# Patient Record
Sex: Female | Born: 1950
Health system: Southern US, Community
[De-identification: ages and names within clinical notes are randomized; demographics above are authoritative.]

## PROBLEM LIST (undated history)

## (undated) DIAGNOSIS — Z800 Family history of malignant neoplasm of digestive organs: Secondary | ICD-10-CM

## (undated) DIAGNOSIS — Z8042 Family history of malignant neoplasm of prostate: Secondary | ICD-10-CM

## (undated) DIAGNOSIS — T7840XA Allergy, unspecified, initial encounter: Secondary | ICD-10-CM

## (undated) DIAGNOSIS — K219 Gastro-esophageal reflux disease without esophagitis: Secondary | ICD-10-CM

## (undated) DIAGNOSIS — Z860100 Personal history of colon polyps, unspecified: Secondary | ICD-10-CM

## (undated) DIAGNOSIS — E785 Hyperlipidemia, unspecified: Secondary | ICD-10-CM

## (undated) DIAGNOSIS — F419 Anxiety disorder, unspecified: Secondary | ICD-10-CM

## (undated) DIAGNOSIS — Z801 Family history of malignant neoplasm of trachea, bronchus and lung: Secondary | ICD-10-CM

## (undated) DIAGNOSIS — I10 Essential (primary) hypertension: Secondary | ICD-10-CM

## (undated) HISTORY — DX: Allergy, unspecified, initial encounter: T78.40XA

## (undated) HISTORY — DX: Personal history of colonic polyps: Z86.010

## (undated) HISTORY — DX: Essential (primary) hypertension: I10

## (undated) HISTORY — DX: Hyperlipidemia, unspecified: E78.5

## (undated) HISTORY — DX: Family history of malignant neoplasm of digestive organs: Z80.0

## (undated) HISTORY — DX: Gastro-esophageal reflux disease without esophagitis: K21.9

## (undated) HISTORY — DX: Family history of malignant neoplasm of prostate: Z80.42

## (undated) HISTORY — DX: Family history of malignant neoplasm of trachea, bronchus and lung: Z80.1

## (undated) HISTORY — DX: Personal history of colon polyps, unspecified: Z86.0100

---
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
## 2005-10-13 ENCOUNTER — Ambulatory Visit: Payer: Self-pay

## 2008-04-30 ENCOUNTER — Ambulatory Visit: Payer: Self-pay | Admitting: Family Medicine

## 2009-07-13 ENCOUNTER — Ambulatory Visit: Payer: Self-pay | Admitting: Family Medicine

## 2009-08-27 LAB — HM COLONOSCOPY: HM Colonoscopy: NORMAL

## 2009-09-15 ENCOUNTER — Ambulatory Visit: Payer: Self-pay | Admitting: Gastroenterology

## 2010-10-18 ENCOUNTER — Ambulatory Visit: Payer: Self-pay | Admitting: Family Medicine

## 2011-12-26 ENCOUNTER — Ambulatory Visit: Payer: Self-pay | Admitting: Family Medicine

## 2012-02-27 ENCOUNTER — Ambulatory Visit: Payer: Self-pay | Admitting: Family Medicine

## 2012-03-19 ENCOUNTER — Ambulatory Visit: Payer: Self-pay | Admitting: Family Medicine

## 2012-04-19 ENCOUNTER — Ambulatory Visit: Payer: Self-pay | Admitting: Family Medicine

## 2012-12-26 ENCOUNTER — Ambulatory Visit: Payer: Self-pay | Admitting: Family Medicine

## 2013-11-03 LAB — LIPID PANEL
Cholesterol: 194 mg/dL (ref 0–200)
HDL: 57 mg/dL (ref 35–70)
LDL CALC: 115 mg/dL
Triglycerides: 110 mg/dL (ref 40–160)

## 2014-02-24 ENCOUNTER — Ambulatory Visit: Payer: Self-pay | Admitting: Family Medicine

## 2014-03-08 LAB — HM MAMMOGRAPHY: HM Mammogram: NORMAL

## 2014-11-25 ENCOUNTER — Ambulatory Visit (INDEPENDENT_AMBULATORY_CARE_PROVIDER_SITE_OTHER): Payer: Commercial Managed Care - HMO | Admitting: Family Medicine

## 2014-11-25 ENCOUNTER — Encounter: Payer: Self-pay | Admitting: Family Medicine

## 2014-11-25 VITALS — BP 124/88 | HR 65 | Temp 98.4°F | Resp 16 | Ht 71.0 in | Wt 250.3 lb

## 2014-11-25 DIAGNOSIS — F419 Anxiety disorder, unspecified: Secondary | ICD-10-CM | POA: Diagnosis not present

## 2014-11-25 DIAGNOSIS — Z13220 Encounter for screening for lipoid disorders: Secondary | ICD-10-CM

## 2014-11-25 DIAGNOSIS — I10 Essential (primary) hypertension: Secondary | ICD-10-CM | POA: Diagnosis not present

## 2014-11-25 DIAGNOSIS — G4700 Insomnia, unspecified: Secondary | ICD-10-CM | POA: Insufficient documentation

## 2014-11-25 DIAGNOSIS — E781 Pure hyperglyceridemia: Secondary | ICD-10-CM | POA: Insufficient documentation

## 2014-11-25 DIAGNOSIS — Z1331 Encounter for screening for depression: Secondary | ICD-10-CM | POA: Insufficient documentation

## 2014-11-25 DIAGNOSIS — E65 Localized adiposity: Secondary | ICD-10-CM | POA: Insufficient documentation

## 2014-11-25 DIAGNOSIS — E8881 Metabolic syndrome: Secondary | ICD-10-CM | POA: Insufficient documentation

## 2014-11-25 DIAGNOSIS — IMO0002 Reserved for concepts with insufficient information to code with codable children: Secondary | ICD-10-CM | POA: Insufficient documentation

## 2014-11-25 DIAGNOSIS — Z6839 Body mass index (BMI) 39.0-39.9, adult: Secondary | ICD-10-CM | POA: Insufficient documentation

## 2014-11-25 DIAGNOSIS — E669 Obesity, unspecified: Secondary | ICD-10-CM | POA: Diagnosis not present

## 2014-11-25 HISTORY — DX: Anxiety disorder, unspecified: F41.9

## 2014-11-25 NOTE — Patient Instructions (Signed)
F/u 4 mo 

## 2014-11-25 NOTE — Progress Notes (Signed)
Name: Bethany Bowman   MRN: [id number]    DOB: [birth year]   Date:11/25/2014       Progress Note  Subjective  Chief Complaint  Chief Complaint  Patient presents with  . Hypertension  . Hyperlipidemia  . Anxiety    Hypertension This is a chronic problem. The current episode started more than 1 year ago. The problem is unchanged. The problem is controlled. Associated symptoms include anxiety. Pertinent negatives include no blurred vision, chest pain, headaches, neck pain, orthopnea, palpitations or shortness of breath. There are no associated agents to hypertension. Risk factors for coronary artery disease include dyslipidemia, obesity, stress and sedentary lifestyle. Past treatments include beta blockers, angiotensin blockers and diuretics. There are no compliance problems.   Hyperlipidemia This is a chronic problem. The current episode started more than 1 year ago. The problem is uncontrolled. Recent lipid tests were reviewed and are high. Exacerbating diseases include obesity. Factors aggravating her hyperlipidemia include fatty foods and thiazides. Pertinent negatives include no chest pain, focal weakness, myalgias or shortness of breath. Current antihyperlipidemic treatment includes statins. The current treatment provides mild improvement of lipids. Risk factors for coronary artery disease include diabetes mellitus, dyslipidemia, hypertension, stress and a sedentary lifestyle.  Anxiety Presents for follow-up visit. Onset was at an unknown time. Symptoms include excessive worry, insomnia, nervous/anxious behavior and restlessness. Patient reports no chest pain, dizziness, nausea, palpitations or shortness of breath. Symptoms occur most days. The severity of symptoms is moderate. The symptoms are aggravated by caffeine and work stress. The quality of sleep is fair.   The treatment provided moderate relief. Compliance with prior treatments has been good.   OBESITY Not exercising  Not following  diet   Past Medical History  Diagnosis Date  . Hypertension   . Hyperlipidemia   . GERD (gastroesophageal reflux disease)   . Allergy     History  Substance Use Topics  . Smoking status: Former Research scientist (life sciences)  . Smokeless tobacco: Not on file  . Alcohol Use: No     Current outpatient prescriptions:  .  atenolol (TENORMIN) 50 MG tablet, Take 50 mg by mouth daily., Disp: , Rfl:  .  LORazepam (ATIVAN) 0.5 MG tablet, Take 0.5 mg by mouth at bedtime., Disp: , Rfl:  .  potassium chloride SA (K-DUR,KLOR-CON) 20 MEQ tablet, Take 20 mEq by mouth 2 (two) times daily., Disp: , Rfl:  .  pravastatin (PRAVACHOL) 20 MG tablet, Take 20 mg by mouth daily., Disp: , Rfl:  .  telmisartan-hydrochlorothiazide (MICARDIS HCT) 80-25 MG per tablet, Take 1 tablet by mouth daily., Disp: , Rfl:   No Known Allergies  Review of Systems  Constitutional: Negative for fever, chills and weight loss.  HENT: Negative for congestion, hearing loss, sore throat and tinnitus.   Eyes: Negative for blurred vision, double vision and redness.  Respiratory: Negative for cough, hemoptysis and shortness of breath.   Cardiovascular: Negative for chest pain, palpitations, orthopnea, claudication and leg swelling.  Gastrointestinal: Negative for heartburn, nausea, vomiting, diarrhea, constipation and blood in stool.  Genitourinary: Negative for dysuria, urgency, frequency and hematuria.  Musculoskeletal: Negative for myalgias, back pain, joint pain, falls and neck pain.  Skin: Negative for itching.  Neurological: Negative for dizziness, tingling, tremors, focal weakness, seizures, loss of consciousness, weakness and headaches.  Endo/Heme/Allergies: Does not bruise/bleed easily.  Psychiatric/Behavioral: Negative for depression and substance abuse. The patient is nervous/anxious and has insomnia.      Objective  Filed Vitals:   11/25/14 0925  BP:  124/88  Pulse: 65  Temp: 98.4 F (36.9 C)  Resp: 16  Height: 5\' 11"  (1.803 m)   Weight: 250 lb 5 oz (113.541 kg)  SpO2: 97%     Physical Exam  Constitutional: She is oriented to person, place, and time and well-developed, well-nourished, and in no distress.  HENT:  Head: Normocephalic.  Eyes: EOM are normal. Pupils are equal, round, and reactive to light.  Neck: Normal range of motion. No thyromegaly present.  Cardiovascular: Normal rate, regular rhythm and normal heart sounds.   No murmur heard. Pulmonary/Chest: Effort normal and breath sounds normal.  Abdominal: Soft. Bowel sounds are normal.  Musculoskeletal: Normal range of motion. She exhibits no edema.  Neurological: She is alert and oriented to person, place, and time. No cranial nerve deficit. Gait normal.  Skin: Skin is warm and dry. No rash noted.  Psychiatric: Memory and affect normal.      Assessment & Plan  1. Essential hypertension Stable    2. Lipid screening  Stable  3. Acute anxiety Worsening with family stressors  4. Obesity (BMI 30.0-34.9) Needs to follow diet and increase exercise

## 2014-11-26 LAB — COMPREHENSIVE METABOLIC PANEL
ALK PHOS: 48 IU/L (ref 39–117)
ALT: 23 IU/L (ref 0–32)
AST: 24 IU/L (ref 0–40)
Albumin/Globulin Ratio: 1.5 (ref 1.1–2.5)
Albumin: 4.4 g/dL (ref 3.6–4.8)
BILIRUBIN TOTAL: 0.4 mg/dL (ref 0.0–1.2)
BUN/Creatinine Ratio: 13 (ref 11–26)
BUN: 13 mg/dL (ref 8–27)
CO2: 27 mmol/L (ref 18–29)
Calcium: 10.5 mg/dL — ABNORMAL HIGH (ref 8.7–10.3)
Chloride: 101 mmol/L (ref 97–108)
Creatinine, Ser: 1.01 mg/dL — ABNORMAL HIGH (ref 0.57–1.00)
GFR calc Af Amer: 67 mL/min/{1.73_m2} (ref >59)
GFR, EST NON AFRICAN AMERICAN: 58 mL/min/{1.73_m2} — AB (ref >59)
Globulin, Total: 3 g/dL (ref 1.5–4.5)
Glucose: 98 mg/dL (ref 65–99)
Potassium: 4.1 mmol/L (ref 3.5–5.2)
Sodium: 143 mmol/L (ref 134–144)
Total Protein: 7.4 g/dL (ref 6.0–8.5)

## 2014-11-26 LAB — TSH: TSH: 1.31 u[IU]/mL (ref 0.450–4.500)

## 2014-11-26 LAB — LIPID PANEL
CHOL/HDL RATIO: 3.9 ratio (ref 0.0–4.4)
CHOLESTEROL TOTAL: 196 mg/dL (ref 100–199)
HDL: 50 mg/dL (ref >39)
LDL CALC: 114 mg/dL — AB (ref 0–99)
TRIGLYCERIDES: 158 mg/dL — AB (ref 0–149)
VLDL Cholesterol Cal: 32 mg/dL (ref 5–40)

## 2014-11-27 NOTE — Progress Notes (Signed)
Pt aware of results 

## 2014-11-30 ENCOUNTER — Other Ambulatory Visit: Payer: Self-pay | Admitting: Family Medicine

## 2015-02-25 ENCOUNTER — Ambulatory Visit: Payer: Commercial Managed Care - HMO | Admitting: Family Medicine

## 2015-03-02 ENCOUNTER — Encounter: Payer: Self-pay | Admitting: Family Medicine

## 2015-03-02 ENCOUNTER — Ambulatory Visit (INDEPENDENT_AMBULATORY_CARE_PROVIDER_SITE_OTHER): Payer: Commercial Managed Care - HMO | Admitting: Family Medicine

## 2015-03-02 VITALS — BP 142/92 | HR 62 | Temp 99.2°F | Resp 16 | Ht 71.0 in | Wt 253.4 lb

## 2015-03-02 DIAGNOSIS — E7800 Pure hypercholesterolemia, unspecified: Secondary | ICD-10-CM

## 2015-03-02 DIAGNOSIS — I10 Essential (primary) hypertension: Secondary | ICD-10-CM | POA: Diagnosis not present

## 2015-03-02 MED ORDER — PRAVASTATIN SODIUM 20 MG PO TABS: 20.0000 mg | ORAL_TABLET | Freq: Every day | ORAL | Status: DC

## 2015-03-02 MED ORDER — LISINOPRIL-HYDROCHLOROTHIAZIDE 20-25 MG PO TABS: 1.0000 | ORAL_TABLET | Freq: Every day | ORAL | Status: DC

## 2015-03-02 MED ORDER — ATENOLOL 50 MG PO TABS
50.0000 mg | ORAL_TABLET | Freq: Every day | ORAL | Status: DC
Start: 2015-03-02 — End: 2015-09-29

## 2015-03-02 NOTE — Progress Notes (Signed)
Name: Bethany Bowman   MRN: [id number]    DOB: Dec 05, 1950   Date:03/02/2015       Progress Note  Subjective  Chief Complaint  Chief Complaint  Patient presents with  . Hypertension    pt here for 4 month follow up  . Hyperlipidemia    HPI  Hypertension   Patient presents for follow-up of hypertension. It has been present for over 5 years.  Patient states that there is compliance with medical regimen which consists of  . There is no end organ disease. Cardiac risk factors include hypertension hyperlipidemia and diabetes.  Exercise regimen consist of  .  Diet consist of an Gen. low-sodium and low-fat . He is refusing her very much to see dietitian   Patient has a history of hyperlipidemia for 5 years.  Current medical regimen consist of pravastatin 20 mg daily at bedtime .  Compliance is about good .  Diet and exercise are currently followed fairly well .  Risk factors for cardiovascular disease include hyperlipidemia , hypertension, obesity, relatively sedentary lifestyle .   There have been no side effects from the medication.    Past Medical History  Diagnosis Date  . Hypertension   . Hyperlipidemia   . GERD (gastroesophageal reflux disease)   . Allergy     Social History  Substance Use Topics  . Smoking status: Former Research scientist (life sciences)  . Smokeless tobacco: Not on file  . Alcohol Use: No     Current outpatient prescriptions:  .  atenolol (TENORMIN) 50 MG tablet, Take 1 tablet (50 mg total) by mouth daily., Disp: 90 tablet, Rfl: 2 .  FLUZONE HIGH-DOSE 0.5 ML SUSY, inject 0.5 milliliter intramuscularly, Disp: , Rfl: 0 .  lisinopril-hydrochlorothiazide (PRINZIDE,ZESTORETIC) 20-25 MG per tablet, Take 1 tablet by mouth daily., Disp: 90 tablet, Rfl: 2 .  LORazepam (ATIVAN) 0.5 MG tablet, Take 0.5 mg by mouth at bedtime., Disp: , Rfl:  .  potassium chloride SA (K-DUR,KLOR-CON) 20 MEQ tablet, Take 20 mEq by mouth 2 (two) times daily., Disp: , Rfl:  .  pravastatin (PRAVACHOL) 20 MG tablet,  Take 1 tablet (20 mg total) by mouth daily., Disp: 90 tablet, Rfl: 2 .  telmisartan-hydrochlorothiazide (MICARDIS HCT) 80-25 MG per tablet, Take 1 tablet by mouth daily., Disp: , Rfl:   No Known Allergies  Review of Systems  Constitutional: Negative for fever, chills and weight loss.  HENT: Negative for congestion, hearing loss, sore throat and tinnitus.   Eyes: Negative for blurred vision, double vision and redness.  Respiratory: Negative for cough, hemoptysis and shortness of breath.   Cardiovascular: Negative for chest pain, palpitations, orthopnea, claudication and leg swelling.  Gastrointestinal: Negative for heartburn, nausea, vomiting, diarrhea, constipation and blood in stool.  Genitourinary: Negative for dysuria, urgency, frequency and hematuria.  Musculoskeletal: Negative for myalgias, back pain, joint pain, falls and neck pain.  Skin: Negative for itching.  Neurological: Negative for dizziness, tingling, tremors, focal weakness, seizures, loss of consciousness, weakness and headaches.  Endo/Heme/Allergies: Does not bruise/bleed easily.  Psychiatric/Behavioral: Negative for depression and substance abuse. The patient is not nervous/anxious and does not have insomnia.      Objective  Filed Vitals:   03/02/15 0824  BP: 142/92  Pulse: 62  Temp: 99.2 F (37.3 C)  Resp: 16  Height: 5\' 11"  (1.803 m)  Weight: 253 lb 6 oz (114.93 kg)  SpO2: 98%     Physical Exam  Constitutional: She is oriented to person, place, and time and well-developed, well-nourished,  and in no distress.  Obese and in no acute distress  HENT:  Head: Normocephalic.  Eyes: EOM are normal. Pupils are equal, round, and reactive to light.  Neck: Normal range of motion. No thyromegaly present.  Cardiovascular: Normal rate, regular rhythm and normal heart sounds.   No murmur heard. Pulmonary/Chest: Effort normal and breath sounds normal.  Abdominal: Soft. Bowel sounds are normal.  Musculoskeletal:  Normal range of motion. She exhibits no edema.  Neurological: She is alert and oriented to person, place, and time. No cranial nerve deficit. Gait normal.  Skin: Skin is warm and dry. No rash noted.  Psychiatric: Memory and affect normal.      Assessment & Plan  1. Hypercholesterolemia without hypertriglyceridemia Stable  2. Benign essential .  Not at [facility] admits to dietary indiscretion with vacationing and will be rechecked in 3 months. At that time will change medication as needed

## 2015-03-30 ENCOUNTER — Telehealth: Payer: Self-pay | Admitting: Family Medicine

## 2015-03-30 ENCOUNTER — Telehealth: Payer: Self-pay | Admitting: Emergency Medicine

## 2015-03-30 NOTE — Telephone Encounter (Signed)
Patient has a question on the daily dosage for her atenolol 50mg .  She is not sure as to how many she is needing to take on a daily basis.

## 2015-03-30 NOTE — Telephone Encounter (Signed)
Patient notified to take 50 mg qd

## 2015-06-01 ENCOUNTER — Ambulatory Visit: Payer: Commercial Managed Care - HMO | Admitting: Family Medicine

## 2015-09-23 ENCOUNTER — Ambulatory Visit: Payer: Commercial Managed Care - HMO | Admitting: Family Medicine

## 2015-09-29 ENCOUNTER — Other Ambulatory Visit: Payer: Self-pay | Admitting: Family Medicine

## 2015-10-19 ENCOUNTER — Other Ambulatory Visit: Payer: Self-pay

## 2015-10-19 MED ORDER — LISINOPRIL-HYDROCHLOROTHIAZIDE 20-25 MG PO TABS
1.0000 | ORAL_TABLET | Freq: Every day | ORAL | Status: DC
Start: 2015-10-19 — End: 2016-02-18

## 2015-10-19 MED ORDER — POTASSIUM CHLORIDE CRYS ER 20 MEQ PO TBCR: 20.0000 meq | EXTENDED_RELEASE_TABLET | Freq: Two times a day (BID) | ORAL | Status: DC

## 2015-12-06 ENCOUNTER — Other Ambulatory Visit: Payer: Self-pay | Admitting: Family Medicine

## 2015-12-08 ENCOUNTER — Other Ambulatory Visit: Payer: Self-pay | Admitting: Family Medicine

## 2015-12-22 ENCOUNTER — Other Ambulatory Visit: Payer: Self-pay | Admitting: Family Medicine

## 2016-01-27 ENCOUNTER — Ambulatory Visit: Payer: Commercial Managed Care - HMO | Admitting: Family Medicine

## 2016-02-18 ENCOUNTER — Encounter: Payer: Self-pay | Admitting: Family Medicine

## 2016-02-18 ENCOUNTER — Ambulatory Visit (INDEPENDENT_AMBULATORY_CARE_PROVIDER_SITE_OTHER): Payer: Commercial Managed Care - HMO | Admitting: Family Medicine

## 2016-02-18 DIAGNOSIS — E785 Hyperlipidemia, unspecified: Secondary | ICD-10-CM | POA: Diagnosis not present

## 2016-02-18 DIAGNOSIS — E559 Vitamin D deficiency, unspecified: Secondary | ICD-10-CM | POA: Diagnosis not present

## 2016-02-18 DIAGNOSIS — J019 Acute sinusitis, unspecified: Secondary | ICD-10-CM | POA: Insufficient documentation

## 2016-02-18 DIAGNOSIS — E65 Localized adiposity: Secondary | ICD-10-CM

## 2016-02-18 DIAGNOSIS — J0100 Acute maxillary sinusitis, unspecified: Secondary | ICD-10-CM | POA: Diagnosis not present

## 2016-02-18 DIAGNOSIS — I10 Essential (primary) hypertension: Secondary | ICD-10-CM | POA: Diagnosis not present

## 2016-02-18 DIAGNOSIS — Z5181 Encounter for therapeutic drug level monitoring: Secondary | ICD-10-CM

## 2016-02-18 LAB — COMPLETE METABOLIC PANEL WITH GFR
ALBUMIN: 4.2 g/dL (ref 3.6–5.1)
ALK PHOS: 43 U/L (ref 33–130)
ALT: 17 U/L (ref 6–29)
AST: 18 U/L (ref 10–35)
BILIRUBIN TOTAL: 0.5 mg/dL (ref 0.2–1.2)
BUN: 18 mg/dL (ref 7–25)
CALCIUM: 9.8 mg/dL (ref 8.6–10.4)
CO2: 29 mmol/L (ref 20–31)
CREATININE: 1.05 mg/dL — AB (ref 0.50–0.99)
Chloride: 103 mmol/L (ref 98–110)
GFR, Est African American: 64 mL/min (ref >=60)
GFR, Est Non African American: 55 mL/min — ABNORMAL LOW (ref >=60)
Glucose, Bld: 105 mg/dL — ABNORMAL HIGH (ref 65–99)
Potassium: 4.2 mmol/L (ref 3.5–5.3)
Sodium: 142 mmol/L (ref 135–146)
Total Protein: 7.5 g/dL (ref 6.1–8.1)

## 2016-02-18 LAB — LIPID PANEL
CHOLESTEROL: 177 mg/dL (ref 125–200)
HDL: 57 mg/dL (ref >=46)
LDL Cholesterol: 100 mg/dL (ref <130)
Total CHOL/HDL Ratio: 3.1 Ratio (ref <=5.0)
Triglycerides: 99 mg/dL (ref <150)
VLDL: 20 mg/dL (ref <30)

## 2016-02-18 LAB — PHOSPHORUS: PHOSPHORUS: 2.5 mg/dL (ref 2.1–4.3)

## 2016-02-18 MED ORDER — PRAVASTATIN SODIUM 20 MG PO TABS: 20.0000 mg | ORAL_TABLET | Freq: Every day | ORAL | 1 refills | Status: DC

## 2016-02-18 MED ORDER — LISINOPRIL-HYDROCHLOROTHIAZIDE 20-25 MG PO TABS: 1.0000 | ORAL_TABLET | Freq: Every day | ORAL | 1 refills | Status: DC

## 2016-02-18 MED ORDER — AMOXICILLIN-POT CLAVULANATE 875-125 MG PO TABS: 1.0000 | ORAL_TABLET | Freq: Two times a day (BID) | ORAL | 0 refills | Status: AC

## 2016-02-18 MED ORDER — POTASSIUM CHLORIDE ER 10 MEQ PO TBCR: 10.0000 meq | EXTENDED_RELEASE_TABLET | Freq: Every day | ORAL | 1 refills | Status: DC

## 2016-02-18 NOTE — Patient Instructions (Addendum)
Your goal blood pressure is less than 150 mmHg on top. Try to follow the DASH guidelines (DASH stands for Dietary Approaches to Stop Hypertension) Try to limit the sodium in your diet.  Ideally, consume less than 1.5 grams (less than 1,500mg ) per day. Do not add salt when cooking or at the table.  Check the sodium amount on labels when shopping, and choose items lower in sodium when given a choice. Avoid or limit foods that already contain a lot of sodium. Eat a diet rich in fruits and vegetables and whole grains.  Try to limit saturated fats in your diet (bologna, hot dogs, barbeque, cheeseburgers, hamburgers, steak, bacon, sausage, cheese, etc.) and get more fresh fruits, vegetables, and whole grains  Start the antibiotics Please do eat yogurt daily or take a probiotic daily for the next month We want to replace the healthy germs in the gut If you notice foul, watery diarrhea in the next two months, schedule an appointment RIGHT AWAY  DASH Eating Plan [place] stands for "Dietary Approaches to Stop Hypertension." The DASH eating plan is a healthy eating plan that has been shown to reduce high blood pressure (hypertension). Additional health benefits may include reducing the risk of type 2 diabetes mellitus, heart disease, and stroke. The DASH eating plan may also help with weight loss. WHAT DO I NEED TO KNOW ABOUT THE DASH EATING PLAN? For the DASH eating plan, you will follow these general guidelines:  Choose foods with a percent daily value for sodium of less than 5% (as listed on the food label).  Use salt-free seasonings or herbs instead of table salt or sea salt.  Check with your health care provider or pharmacist before using salt substitutes.  Eat lower-sodium products, often labeled as "lower sodium" or "no salt added."  Eat fresh foods.  Eat more vegetables, fruits, and low-fat dairy products.  Choose whole grains. Look for the word "whole" as the first word in the ingredient  list.  Choose fish and skinless chicken or [place] more often than red meat. Limit fish, poultry, and meat to 6 oz (170 g) each day.  Limit sweets, desserts, sugars, and sugary drinks.  Choose heart-healthy fats.  Limit cheese to 1 oz (28 g) per day.  Eat more home-cooked food and less restaurant, buffet, and fast food.  Limit fried foods.  Cook foods using methods other than frying.  Limit canned vegetables. If you do use them, rinse them well to decrease the sodium.  When eating at a restaurant, ask that your food be prepared with less salt, or no salt if possible. WHAT FOODS CAN I EAT? Seek help from a dietitian for individual calorie needs. Grains Whole grain or whole wheat bread. Brown rice. Whole grain or whole wheat pasta. Quinoa, bulgur, and whole grain cereals. Low-sodium cereals. Corn or whole wheat flour tortillas. Whole grain cornbread. Whole grain crackers. Low-sodium crackers. Vegetables Fresh or frozen vegetables (raw, steamed, roasted, or grilled). Low-sodium or reduced-sodium tomato and vegetable juices. Low-sodium or reduced-sodium tomato sauce and paste. Low-sodium or reduced-sodium canned vegetables.  Fruits All fresh, canned (in natural juice), or frozen fruits. Meat and Other Protein Products Ground beef (85% or leaner), grass-fed beef, or beef trimmed of fat. Skinless chicken or [place]. Ground chicken or [place]. Pork trimmed of fat. All fish and seafood. Eggs. Dried beans, peas, or lentils. Unsalted nuts and seeds. Unsalted canned beans. Dairy Low-fat dairy products, such as skim or 1% milk, 2% or reduced-fat cheeses, low-fat ricotta or  cottage cheese, or plain low-fat yogurt. Low-sodium or reduced-sodium cheeses. Fats and Oils Tub margarines without trans fats. Light or reduced-fat mayonnaise and salad dressings (reduced sodium). Avocado. Safflower, olive, or canola oils. Natural peanut or almond butter. Other Unsalted popcorn and pretzels. The items listed  above may not be a complete list of recommended foods or beverages. Contact your dietitian for more options. WHAT FOODS ARE NOT RECOMMENDED? Grains White bread. White pasta. White rice. Refined cornbread. Bagels and croissants. Crackers that contain trans fat. Vegetables Creamed or fried vegetables. Vegetables in a cheese sauce. Regular canned vegetables. Regular canned tomato sauce and paste. Regular tomato and vegetable juices. Fruits Dried fruits. Canned fruit in light or heavy syrup. Fruit juice. Meat and Other Protein Products Fatty cuts of meat. Ribs, chicken wings, bacon, sausage, bologna, salami, chitterlings, fatback, hot dogs, bratwurst, and packaged luncheon meats. Salted nuts and seeds. Canned beans with salt. Dairy Whole or 2% milk, cream, half-and-half, and cream cheese. Whole-fat or sweetened yogurt. Full-fat cheeses or blue cheese. Nondairy creamers and whipped toppings. Processed cheese, cheese spreads, or cheese curds. Condiments Onion and garlic salt, seasoned salt, table salt, and sea salt. Canned and packaged gravies. Worcestershire sauce. Tartar sauce. Barbecue sauce. Teriyaki sauce. Soy sauce, including reduced sodium. Steak sauce. Fish sauce. Oyster sauce. Cocktail sauce. Horseradish. Ketchup and mustard. Meat flavorings and tenderizers. Bouillon cubes. Hot sauce. Tabasco sauce. Marinades. Taco seasonings. Relishes. Fats and Oils Butter, stick margarine, lard, shortening, ghee, and bacon fat. Coconut, palm kernel, or palm oils. Regular salad dressings. Other Pickles and olives. Salted popcorn and pretzels. The items listed above may not be a complete list of foods and beverages to avoid. Contact your dietitian for more information. WHERE CAN I FIND MORE INFORMATION? National Heart, Lung, and Blood Institute: travelstabloid.com   This information is not intended to replace advice given to you by your health care provider. Make sure you  discuss any questions you have with your health care provider.   Document Released: 05/25/2011 Document Revised: 06/26/2014 Document Reviewed: 04/09/2013 Elsevier Interactive Patient Education [employer].

## 2016-02-18 NOTE — Assessment & Plan Note (Addendum)
Recheck that today, might be from HCTZ; check vit D

## 2016-02-18 NOTE — Assessment & Plan Note (Signed)
Check labs 

## 2016-02-18 NOTE — Assessment & Plan Note (Signed)
Praised her for weight loss, see AVS

## 2016-02-18 NOTE — Assessment & Plan Note (Signed)
Continue medicine; try DASH guidelines; avoid decongestants; avoid black licorice; avoid NSAID

## 2016-02-18 NOTE — Assessment & Plan Note (Signed)
Check lipids today; continue statin; avoid saturated fats, increase whole grains

## 2016-02-18 NOTE — Assessment & Plan Note (Signed)
Check vit D 

## 2016-02-18 NOTE — Progress Notes (Signed)
 BP 140/84   Pulse 77   Temp 99.2 F (37.3 C) (Oral)   Resp 14   Wt 242 lb (109.8 kg)   SpO2 98%   BMI 33.75 kg/m    Subjective:    Patient ID: Bethany Bowman, female    DOB: 05/22/1951, 65 y.o.   MRN: [id number]  HPI: Bethany Bowman is a 65 y.o. female  Chief Complaint  Patient presents with  . Medication Refill  . Allergic Rhinitis    Patient is new to me; her usual provider is out of the office for an extended time  HTN; on combination pill, just took her pill before coming in so she thinks her BP might be a little higher than usual right now; does add salt to her food some when cooking, but never at the table but no salt substitutes HTN runs in the family  High cholesterol; no muscle aches; tries to watch her diet lately; milk in cereal High cholesterol runs in the family  Allergic rhinitis; they are giving her a fit; she wakes up every morning with pressure; drains and coughs udring the night; left ear is giving her a fit, like a tunnel; new; going on for a few weeks; little dizzy, has had with allergies in the past; was taking generic zyrtec   Depression screen PHQ 2/9 02/18/2016 03/02/2015 11/25/2014  Decreased Interest 0 0 0  Down, Depressed, Hopeless 0 0 0  PHQ - 2 Score 0 0 0    Relevant past medical, surgical, family and social history reviewed Past Medical History:  Diagnosis Date  . Allergy   . GERD (gastroesophageal reflux disease)   . Hyperlipidemia   . Hypertension    No past surgical history on file.   Family History  Problem Relation Age of Onset  . Colon cancer Mother   . Hypertension Mother   . Lung cancer Father   . Alcohol abuse Father   High cholesterol runs in the family  Social History  Substance Use Topics  . Smoking status: Former Smoker  . Smokeless tobacco: Not on file  . Alcohol use No   Interim medical history since last visit reviewed. Allergies and medications reviewed  Review of Systems Per HPI unless specifically indicated  above     Objective:    BP 140/84   Pulse 77   Temp 99.2 F (37.3 C) (Oral)   Resp 14   Wt 242 lb (109.8 kg)   SpO2 98%   BMI 33.75 kg/m   Wt Readings from Last 3 Encounters:  02/18/16 242 lb (109.8 kg)  03/02/15 253 lb 6 oz (114.9 kg)  11/25/14 250 lb 5 oz (113.5 kg)    Physical Exam  Constitutional: She appears well-developed and well-nourished. No distress.  HENT:  Head: Normocephalic and atraumatic.  Right Ear: Hearing, tympanic membrane, external ear and ear canal normal. Tympanic membrane is not erythematous.  Left Ear: Hearing, external ear and ear canal normal. Tympanic membrane is not erythematous. A middle ear effusion (scant; only lower 1/2 of TM visible (cerumen occluded visualization of upper 1/2)) is present.  Nose: Mucosal edema and rhinorrhea present. Right sinus exhibits maxillary sinus tenderness. Left sinus exhibits maxillary sinus tenderness.  Mouth/Throat: Oropharynx is clear and moist and mucous membranes are normal.  Eyes: EOM are normal. No scleral icterus.  Neck: No thyromegaly present.  Cardiovascular: Normal rate, regular rhythm and normal heart sounds.   No murmur heard. Pulmonary/Chest: Effort normal and breath sounds normal.   No respiratory distress. She has no wheezes.  Abdominal: Soft. Bowel sounds are normal. She exhibits no distension.  Musculoskeletal: Normal range of motion. She exhibits no edema.  Neurological: She is alert. She exhibits normal muscle tone.  Skin: Skin is warm and dry. She is not diaphoretic. No pallor.  Psychiatric: She has a normal mood and affect. Her behavior is normal. Judgment and thought content normal.   Results for orders placed or performed in visit on 11/25/14  TSH  Result Value Ref Range   TSH 1.310 0.450 - 4.500 uIU/mL  Lipid Profile  Result Value Ref Range   Cholesterol, Total 196 100 - 199 mg/dL   Triglycerides 158 (H) 0 - 149 mg/dL   HDL 50 >39 mg/dL   VLDL Cholesterol Cal 32 5 - 40 mg/dL   LDL  Calculated 114 (H) 0 - 99 mg/dL   Chol/HDL Ratio 3.9 0.0 - 4.4 ratio units  Comp Met (CMET)  Result Value Ref Range   Glucose 98 65 - 99 mg/dL   BUN 13 8 - 27 mg/dL   Creatinine, Ser 1.01 (H) 0.57 - 1.00 mg/dL   GFR calc non Af Amer 58 (L) >59 mL/min/1.73   GFR calc Af Amer 67 >59 mL/min/1.73   BUN/Creatinine Ratio 13 11 - 26   Sodium 143 134 - 144 mmol/L   Potassium 4.1 3.5 - 5.2 mmol/L   Chloride 101 97 - 108 mmol/L   CO2 27 18 - 29 mmol/L   Calcium 10.5 (H) 8.7 - 10.3 mg/dL   Total Protein 7.4 6.0 - 8.5 g/dL   Albumin 4.4 3.6 - 4.8 g/dL   Globulin, Total 3.0 1.5 - 4.5 g/dL   Albumin/Globulin Ratio 1.5 1.1 - 2.5   Bilirubin Total 0.4 0.0 - 1.2 mg/dL   Alkaline Phosphatase 48 39 - 117 IU/L   AST 24 0 - 40 IU/L   ALT 23 0 - 32 IU/L      Assessment & Plan:   Problem List Items Addressed This Visit      Cardiovascular and Mediastinum   Benign essential HTN    Continue medicine; try DASH guidelines; avoid decongestants; avoid black licorice; avoid NSAID      Relevant Medications   pravastatin (PRAVACHOL) 20 MG tablet   lisinopril-hydrochlorothiazide (PRINZIDE,ZESTORETIC) 20-25 MG tablet   Other Relevant Orders   Microalbumin / creatinine urine ratio     Respiratory   Acute sinusitis    Start antibiotic; use probiotic 2 weeks      Relevant Medications   amoxicillin-clavulanate (AUGMENTIN) 875-125 MG tablet     Other   Vitamin D deficiency    Check vit D      Relevant Orders   VITAMIN D 25 Hydroxy (Vit-D Deficiency, Fractures)   Hyperlipidemia LDL goal <100    Check lipids today; continue statin; avoid saturated fats, increase whole grains      Relevant Medications   pravastatin (PRAVACHOL) 20 MG tablet   lisinopril-hydrochlorothiazide (PRINZIDE,ZESTORETIC) 20-25 MG tablet   Other Relevant Orders   Lipid panel   Hypercalcemia    Recheck that today, might be from HCTZ; check vit D      Relevant Orders   VITAMIN D 25 Hydroxy (Vit-D Deficiency, Fractures)     Phosphorus   Encounter for medication monitoring    Check labs      Relevant Orders   COMPLETE METABOLIC PANEL WITH GFR   Abdominal obesity    Praised her for weight loss, see AVS  Other Visit Diagnoses   None.     Follow up plan: Return in about 6 months (around 08/17/2016) for follow-up and fasting labs.  An after-visit summary was printed and given to the patient at [facility].  Please see the patient instructions which may contain other information and recommendations beyond what is mentioned above in the assessment and plan.  Meds ordered this encounter  Medications  . potassium chloride (KLOR-CON 10) 10 MEQ tablet    Sig: Take 1 tablet (10 mEq total) by mouth daily.    Dispense:  90 tablet    Refill:  1  . pravastatin (PRAVACHOL) 20 MG tablet    Sig: Take 1 tablet (20 mg total) by mouth daily.    Dispense:  90 tablet    Refill:  1  . lisinopril-hydrochlorothiazide (PRINZIDE,ZESTORETIC) 20-25 MG tablet    Sig: Take 1 tablet by mouth daily.    Dispense:  90 tablet    Refill:  1  . amoxicillin-clavulanate (AUGMENTIN) 875-125 MG tablet    Sig: Take 1 tablet by mouth 2 (two) times daily.    Dispense:  20 tablet    Refill:  0    Orders Placed This Encounter  Procedures  . COMPLETE METABOLIC PANEL WITH GFR  . Lipid panel  . Microalbumin / creatinine urine ratio  . VITAMIN D 25 Hydroxy (Vit-D Deficiency, Fractures)  . Phosphorus

## 2016-02-18 NOTE — Assessment & Plan Note (Signed)
Start antibiotic; use probiotic 2 weeks

## 2016-02-19 LAB — MICROALBUMIN / CREATININE URINE RATIO
Creatinine, Urine: 167 mg/dL (ref 20–320)
MICROALB UR: 1.3 mg/dL
MICROALB/CREAT RATIO: 8 ug/mg{creat} (ref <30)

## 2016-02-19 LAB — VITAMIN D 25 HYDROXY (VIT D DEFICIENCY, FRACTURES): VIT D 25 HYDROXY: 36 ng/mL (ref 30–100)

## 2016-03-09 ENCOUNTER — Other Ambulatory Visit: Payer: Self-pay | Admitting: Family Medicine

## 2016-03-16 ENCOUNTER — Telehealth: Payer: Self-pay | Admitting: Family Medicine

## 2016-03-16 MED ORDER — ATENOLOL 50 MG PO TABS: 50.0000 mg | ORAL_TABLET | Freq: Every day | ORAL | 0 refills | Status: DC

## 2016-03-16 MED ORDER — ATENOLOL 50 MG PO TABS: 50.0000 mg | ORAL_TABLET | Freq: Every day | ORAL | 1 refills | Status: DC

## 2016-03-16 NOTE — Telephone Encounter (Signed)
Certainly, I'll send in Rx now

## 2016-03-16 NOTE — Telephone Encounter (Signed)
Pt said that you have refilled all her RX except her Blood Pressure Meds. She had an appointment on 02-18-16. Please send in atenolol to [facility]. Pt is out. Could you call in enough for about 2 wks or 1 month to her local pharm [facility] on [address]) till the mail order can get her the rest.

## 2016-04-12 ENCOUNTER — Other Ambulatory Visit: Payer: Self-pay | Admitting: Family Medicine

## 2016-04-13 NOTE — Telephone Encounter (Signed)
Call Pt asked to see if she need the refill Rx. Pt stated the reason she is asking for a RX her regular pharmacy [facility] mail order the Medication is on back order.  She is asking for the RX to sent and approved through [facility] instead so she can have her medication.

## 2016-04-13 NOTE — Telephone Encounter (Signed)
Request received for atenolol; I just approved a 6 month supply on 03/16/16; please resolve this with the pharmacy; thank you

## 2016-04-14 MED ORDER — ATENOLOL 50 MG PO TABS: 50.0000 mg | ORAL_TABLET | Freq: Every day | ORAL | 1 refills | Status: DC

## 2016-04-14 NOTE — Telephone Encounter (Signed)
New rx sent to [facility]

## 2016-07-11 ENCOUNTER — Other Ambulatory Visit: Payer: Self-pay | Admitting: Family Medicine

## 2016-07-17 NOTE — Telephone Encounter (Signed)
Last labs reviewed; Rxs approved 

## 2016-08-17 ENCOUNTER — Ambulatory Visit: Payer: Commercial Managed Care - HMO | Admitting: Family Medicine

## 2016-08-24 ENCOUNTER — Ambulatory Visit (INDEPENDENT_AMBULATORY_CARE_PROVIDER_SITE_OTHER): Payer: Commercial Managed Care - HMO | Admitting: Family Medicine

## 2016-08-24 ENCOUNTER — Encounter: Payer: Self-pay | Admitting: Family Medicine

## 2016-08-24 VITALS — BP 136/78 | Temp 98.5°F | Resp 16 | Wt 248.6 lb

## 2016-08-24 DIAGNOSIS — Z1211 Encounter for screening for malignant neoplasm of colon: Secondary | ICD-10-CM

## 2016-08-24 DIAGNOSIS — Z5181 Encounter for therapeutic drug level monitoring: Secondary | ICD-10-CM

## 2016-08-24 DIAGNOSIS — Z1239 Encounter for other screening for malignant neoplasm of breast: Secondary | ICD-10-CM

## 2016-08-24 DIAGNOSIS — Z13820 Encounter for screening for osteoporosis: Secondary | ICD-10-CM | POA: Diagnosis not present

## 2016-08-24 DIAGNOSIS — Z1231 Encounter for screening mammogram for malignant neoplasm of breast: Secondary | ICD-10-CM | POA: Diagnosis not present

## 2016-08-24 DIAGNOSIS — E8881 Metabolic syndrome: Secondary | ICD-10-CM | POA: Diagnosis not present

## 2016-08-24 DIAGNOSIS — I10 Essential (primary) hypertension: Secondary | ICD-10-CM

## 2016-08-24 DIAGNOSIS — E785 Hyperlipidemia, unspecified: Secondary | ICD-10-CM | POA: Diagnosis not present

## 2016-08-24 DIAGNOSIS — Z1159 Encounter for screening for other viral diseases: Secondary | ICD-10-CM | POA: Diagnosis not present

## 2016-08-24 LAB — COMPLETE METABOLIC PANEL WITH GFR
ALT: 18 U/L (ref 6–29)
AST: 21 U/L (ref 10–35)
Albumin: 4 g/dL (ref 3.6–5.1)
Alkaline Phosphatase: 46 U/L (ref 33–130)
BUN: 15 mg/dL (ref 7–25)
CHLORIDE: 102 mmol/L (ref 98–110)
CO2: 29 mmol/L (ref 20–31)
Calcium: 9.9 mg/dL (ref 8.6–10.4)
Creat: 1.04 mg/dL — ABNORMAL HIGH (ref 0.50–0.99)
GFR, EST AFRICAN AMERICAN: 64 mL/min (ref >=60)
GFR, EST NON AFRICAN AMERICAN: 56 mL/min — AB (ref >=60)
Glucose, Bld: 87 mg/dL (ref 65–99)
Potassium: 4.3 mmol/L (ref 3.5–5.3)
Sodium: 142 mmol/L (ref 135–146)
Total Bilirubin: 0.5 mg/dL (ref 0.2–1.2)
Total Protein: 7.1 g/dL (ref 6.1–8.1)

## 2016-08-24 LAB — HEPATITIS C ANTIBODY: HCV Ab: NEGATIVE

## 2016-08-24 LAB — LIPID PANEL
CHOL/HDL RATIO: 3.4 ratio (ref <5.0)
Cholesterol: 196 mg/dL (ref <200)
HDL: 57 mg/dL (ref >50)
LDL CALC: 112 mg/dL — AB (ref <100)
TRIGLYCERIDES: 133 mg/dL (ref <150)
VLDL: 27 mg/dL (ref <30)

## 2016-08-24 MED ORDER — ATENOLOL 50 MG PO TABS: 50.0000 mg | ORAL_TABLET | Freq: Every day | ORAL | 3 refills | Status: DC

## 2016-08-24 NOTE — Assessment & Plan Note (Signed)
Encourage modest gradual weight loss; weight loss should help with pressure and lipids

## 2016-08-24 NOTE — Assessment & Plan Note (Signed)
Check Cr and SGPT 

## 2016-08-24 NOTE — Progress Notes (Signed)
BP 136/78 (BP Location: Left Arm)   Temp 98.5 F (36.9 C) (Oral)   Resp 16   Wt 248 lb 9 oz (112.7 kg)   SpO2 95%   BMI 34.67 kg/m    Subjective:    Patient ID: Bethany Bowman, female    DOB: 09/20/1950, 66 y.o.   MRN: [id number]  HPI: [name] is a 66 y.o. female  Chief Complaint  Patient presents with  . Hyperlipidemia    F/U  . Hypertension    F/U   She is here for f/u; well since last visit  High cholesterol; tries to cut down on the fatty meats, but has cut back; might eat 6-8 eggs a week; just occasionally eats cheese  Hypertension; checks BP at work once a week; usually gets 120s; she has cut back on salt, still cooks with it, but not adding salt at the table; uses decongestants just PRN  She got a flu vaccine this season and has been healthy She works for [employer]; already had her pneumonia shots (both of them)  She takes something for sleep like Tylenol PM; wakes up in the middle   Depression screen [facility] 2/9 08/24/2016 02/18/2016 03/02/2015 11/25/2014  Decreased Interest 0 0 0 0  Down, Depressed, Hopeless 0 0 0 0  PHQ - 2 Score 0 0 0 0   Relevant past medical, surgical, family and social history reviewed Past Medical History:  Diagnosis Date  . Allergy   . GERD (gastroesophageal reflux disease)   . Hyperlipidemia   . Hypertension    No past surgical history on file.   Family History  Problem Relation Age of Onset  . Colon cancer Mother   . Hypertension Mother   . Alcohol abuse Father   . Hypertension Sister   . Hypertension Brother   . Lung cancer Brother   . Hypertension Brother   . Hypertension Brother   . Prostate cancer Brother    Social History  Substance Use Topics  . Smoking status: Former Smoker    Quit date: 08/03/1981  . Smokeless tobacco: Never Used  . Alcohol use No   Interim medical history since last visit reviewed. Allergies and medications reviewed  Review of Systems Per HPI unless specifically indicated above       Objective:    BP 136/78 (BP Location: Left Arm)   Temp 98.5 F (36.9 C) (Oral)   Resp 16   Wt 248 lb 9 oz (112.7 kg)   SpO2 95%   BMI 34.67 kg/m   Wt Readings from Last 3 Encounters:  08/24/16 248 lb 9 oz (112.7 kg)  02/18/16 242 lb (109.8 kg)  03/02/15 253 lb 6 oz (114.9 kg)    Physical Exam  Constitutional: She appears well-developed and well-nourished. No distress.  Obese; weight gain 6 pounds over last 6 months  HENT:  Head: Normocephalic and atraumatic.  Eyes: EOM are normal. No scleral icterus.  Neck: No thyromegaly present.  Cardiovascular: Normal rate, regular rhythm and normal heart sounds.   No murmur heard. Pulmonary/Chest: Effort normal and breath sounds normal. No respiratory distress. She has no wheezes.  Abdominal: Soft. Bowel sounds are normal. She exhibits no distension.  Musculoskeletal: Normal range of motion. She exhibits no edema.  Neurological: She is alert. She exhibits normal muscle tone.  Skin: Skin is warm and dry. She is not diaphoretic. No pallor.  Psychiatric: She has a normal mood and affect. Her behavior is normal. Judgment and thought  content normal.      Assessment & Plan:   Problem List Items Addressed This Visit      Cardiovascular and Mediastinum   Benign essential HTN - Primary    Limit decongestants      Relevant Medications   atenolol (TENORMIN) 50 MG tablet     Other   Hyperlipidemia LDL goal <100    Check lipids; try to limit saturated fats; more whole grains, fruits, veggies      Relevant Medications   atenolol (TENORMIN) 50 MG tablet   Other Relevant Orders   Lipid panel (Completed)   Encounter for medication monitoring    Check Cr and SGPT      Relevant Orders   COMPLETE METABOLIC PANEL WITH GFR (Completed)   Dysmetabolic syndrome    Encourage modest gradual weight loss; weight loss should help with pressure and lipids       Other Visit Diagnoses    Need for hepatitis C screening test       Relevant Orders    Hepatitis C Antibody (Completed)   Breast screening       Breast cancer screening       Screening for breast cancer       Relevant Orders   MM Digital Screening   Screening for osteoporosis       Relevant Orders   DG Bone Density   Screening for colon cancer       Relevant Orders   Ambulatory referral to Gastroenterology       Follow up plan: Return in about 6 months (around 02/24/2017) for visit and fasting labs with Dr. Sanda Klein.  An after-visit summary was printed and given to the patient at [place].  Please see the patient instructions which may contain other information and recommendations beyond what is mentioned above in the assessment and plan.  Meds ordered this encounter  Medications  . cholecalciferol (VITAMIN D) 1000 units tablet    Sig: Take 1,000 Units by mouth daily.  Marland Kitchen atenolol (TENORMIN) 50 MG tablet    Sig: Take 1 tablet (50 mg total) by mouth daily.    Dispense:  90 tablet    Refill:  3    Orders Placed This Encounter  Procedures  . MM Digital Screening  . DG Bone Density  . Hepatitis C Antibody  . Lipid panel  . COMPLETE METABOLIC PANEL WITH GFR  . Ambulatory referral to Gastroenterology

## 2016-08-24 NOTE — Patient Instructions (Addendum)
Try to eat no more than 3 egg yolks per week Try to use PLAIN allergy medicine without the decongestant Avoid: phenylephrine, phenylpropanolamine, and pseudoephredine If you absolutely need something to open up congested nasal passages, try nasal spray for a few days If you ever use Afrin, only use for THREE DAYS MAX Please do call to schedule your mammogram and bone density test; the number to schedule one at either [facility] or [facility] is [phone] We'll get labs today Check out the information at familydoctor.org entitled "Nutrition for Weight Loss: What You Need to Know about Fad Diets" Try to lose between 1-2 pounds per week by taking in fewer calories and burning off more calories You can succeed by limiting portions, limiting foods dense in calories and fat, becoming more active, and drinking 8 glasses of water a day (64 ounces) Don't skip meals, especially breakfast, as skipping meals may alter your metabolism Do not use over-the-counter weight loss pills or gimmicks that claim rapid weight loss A healthy BMI (or body mass index) is between 18.5 and 24.9 You can calculate your ideal BMI at the [place] website ClubMonetize.fr   Obesity, Adult Obesity is having too much body fat. If you have a BMI of 30 or more, you are obese. BMI is a number that explains how much body fat you have. Obesity is often caused by taking in (consuming) more calories than your body uses. Obesity can cause serious health problems. Changing your lifestyle can help to treat obesity. Follow these instructions at home: Eating and drinking    Follow advice from your doctor about what to eat and drink. Your doctor may tell you to:  Cut down on (limit) fast foods, sweets, and processed snack foods.  Choose low-fat options. For example, choose low-fat milk instead of whole milk.  Eat 5 or more servings of fruits or vegetables  every day.  Eat at home more often. This gives you more control over what you eat.  Choose healthy foods when you eat out.  Learn what a healthy portion size is. A portion size is the amount of a certain food that is healthy for you to eat at one time. This is different for each person.  Keep low-fat snacks available.  Avoid sugary drinks. These include soda, fruit juice, iced tea that is sweetened with sugar, and flavored milk.  Eat a healthy breakfast.  Drink enough water to keep your pee (urine) clear or pale yellow.  Do not go without eating for long periods of time (do not fast).  Do not go on popular or trendy diets (fad diets). Physical Activity   Exercise often, as told by your doctor. Ask your doctor:  What types of exercise are safe for you.  How often you should exercise.  Warm up and stretch before being active.  Do slow stretching after being active (cool down).  Rest between times of being active. Lifestyle   Limit how much time you spend in front of your TV, computer, or video game system (be less sedentary).  Find ways to reward yourself that do not involve food.  Limit alcohol intake to no more than 1 drink a day for nonpregnant women and 2 drinks a day for men. One drink equals 12 oz of beer, 5 oz of wine, or 1 oz of hard liquor. General instructions   Keep a weight loss journal. This can help you keep track of:  The food that you eat.  The  exercise that you do.  Take over-the-counter and prescription medicines only as told by your doctor.  Take vitamins and supplements only as told by your doctor.  Think about joining a support group. Your doctor may be able to help with this.  Keep all follow-up visits as told by your doctor. This is important. Contact a doctor if:  You cannot meet your weight loss goal after you have changed your diet and lifestyle for 6 weeks. This information is not intended to replace advice given to you by your health  care provider. Make sure you discuss any questions you have with your health care provider. Document Released: 08/28/2011 Document Revised: 11/11/2015 Document Reviewed: 03/24/2015 Elsevier Interactive Patient Education  2017 [employer].

## 2016-08-24 NOTE — Assessment & Plan Note (Signed)
Limit decongestants

## 2016-08-24 NOTE — Assessment & Plan Note (Signed)
Check lipids; try to limit saturated fats; more whole grains, fruits, veggies

## 2016-10-09 ENCOUNTER — Ambulatory Visit
Admission: RE | Admit: 2016-10-09 | Discharge: 2016-10-09 | Disposition: A | Discharge status: Home / Self Care | Payer: Medicare HMO | Source: Ambulatory Visit | Attending: Family Medicine | Admitting: Family Medicine

## 2016-10-09 DIAGNOSIS — Z13820 Encounter for screening for osteoporosis: Secondary | ICD-10-CM | POA: Insufficient documentation

## 2016-10-09 DIAGNOSIS — Z780 Asymptomatic menopausal state: Secondary | ICD-10-CM | POA: Diagnosis not present

## 2016-10-09 DIAGNOSIS — Z1239 Encounter for other screening for malignant neoplasm of breast: Secondary | ICD-10-CM

## 2016-10-09 DIAGNOSIS — Z1231 Encounter for screening mammogram for malignant neoplasm of breast: Secondary | ICD-10-CM | POA: Diagnosis not present

## 2016-10-23 DIAGNOSIS — H524 Presbyopia: Secondary | ICD-10-CM | POA: Diagnosis not present

## 2016-10-23 DIAGNOSIS — H2513 Age-related nuclear cataract, bilateral: Secondary | ICD-10-CM | POA: Diagnosis not present

## 2016-10-23 DIAGNOSIS — H25013 Cortical age-related cataract, bilateral: Secondary | ICD-10-CM | POA: Diagnosis not present

## 2016-10-31 DIAGNOSIS — Z0100 Encounter for examination of eyes and vision without abnormal findings: Secondary | ICD-10-CM | POA: Diagnosis not present

## 2016-11-10 ENCOUNTER — Other Ambulatory Visit: Payer: Self-pay

## 2016-11-10 DIAGNOSIS — Z1211 Encounter for screening for malignant neoplasm of colon: Secondary | ICD-10-CM

## 2016-12-05 ENCOUNTER — Other Ambulatory Visit: Payer: Self-pay | Admitting: Family Medicine

## 2016-12-05 NOTE — Telephone Encounter (Signed)
Reviewed last Cr and K+ and SGPT; Rxs approved

## 2016-12-28 ENCOUNTER — Ambulatory Visit
Admission: RE | Admit: 2016-12-28 | Discharge: 2016-12-28 | Disposition: A | Discharge status: Home / Self Care | Payer: Medicare HMO | Source: Ambulatory Visit | Attending: Gastroenterology | Admitting: Gastroenterology

## 2016-12-28 ENCOUNTER — Ambulatory Visit: Payer: Medicare HMO | Admitting: Anesthesiology

## 2016-12-28 ENCOUNTER — Encounter: Payer: Self-pay | Admitting: *Deleted

## 2016-12-28 ENCOUNTER — Encounter
Admission: RE | Disposition: A | Discharge status: Home / Self Care | Payer: Self-pay | Source: Ambulatory Visit | Attending: Gastroenterology

## 2016-12-28 DIAGNOSIS — D125 Benign neoplasm of sigmoid colon: Secondary | ICD-10-CM | POA: Diagnosis not present

## 2016-12-28 DIAGNOSIS — K635 Polyp of colon: Secondary | ICD-10-CM | POA: Insufficient documentation

## 2016-12-28 DIAGNOSIS — Z86010 Personal history of colonic polyps: Secondary | ICD-10-CM | POA: Diagnosis not present

## 2016-12-28 DIAGNOSIS — F419 Anxiety disorder, unspecified: Secondary | ICD-10-CM | POA: Diagnosis not present

## 2016-12-28 DIAGNOSIS — D122 Benign neoplasm of ascending colon: Secondary | ICD-10-CM | POA: Diagnosis not present

## 2016-12-28 DIAGNOSIS — D124 Benign neoplasm of descending colon: Secondary | ICD-10-CM | POA: Diagnosis not present

## 2016-12-28 DIAGNOSIS — E669 Obesity, unspecified: Secondary | ICD-10-CM | POA: Diagnosis not present

## 2016-12-28 DIAGNOSIS — Z6835 Body mass index (BMI) 35.0-35.9, adult: Secondary | ICD-10-CM | POA: Insufficient documentation

## 2016-12-28 DIAGNOSIS — Z800 Family history of malignant neoplasm of digestive organs: Secondary | ICD-10-CM | POA: Insufficient documentation

## 2016-12-28 DIAGNOSIS — D123 Benign neoplasm of transverse colon: Secondary | ICD-10-CM | POA: Insufficient documentation

## 2016-12-28 DIAGNOSIS — K640 First degree hemorrhoids: Secondary | ICD-10-CM | POA: Diagnosis not present

## 2016-12-28 DIAGNOSIS — D128 Benign neoplasm of rectum: Secondary | ICD-10-CM | POA: Diagnosis not present

## 2016-12-28 DIAGNOSIS — K621 Rectal polyp: Secondary | ICD-10-CM

## 2016-12-28 DIAGNOSIS — Z1211 Encounter for screening for malignant neoplasm of colon: Secondary | ICD-10-CM | POA: Insufficient documentation

## 2016-12-28 DIAGNOSIS — Z87891 Personal history of nicotine dependence: Secondary | ICD-10-CM | POA: Insufficient documentation

## 2016-12-28 DIAGNOSIS — I10 Essential (primary) hypertension: Secondary | ICD-10-CM | POA: Insufficient documentation

## 2016-12-28 DIAGNOSIS — E785 Hyperlipidemia, unspecified: Secondary | ICD-10-CM | POA: Insufficient documentation

## 2016-12-28 DIAGNOSIS — Z79899 Other long term (current) drug therapy: Secondary | ICD-10-CM | POA: Insufficient documentation

## 2016-12-28 DIAGNOSIS — K219 Gastro-esophageal reflux disease without esophagitis: Secondary | ICD-10-CM | POA: Insufficient documentation

## 2016-12-28 HISTORY — PX: COLONOSCOPY WITH PROPOFOL: SHX5780

## 2016-12-28 SURGERY — COLONOSCOPY WITH PROPOFOL
Anesthesia: General

## 2016-12-28 MED ORDER — PROPOFOL 500 MG/50ML IV EMUL
INTRAVENOUS | Status: DC | PRN
  Administered 2016-12-28: 75 ug/kg/min via INTRAVENOUS

## 2016-12-28 MED ORDER — PROPOFOL 10 MG/ML IV BOLUS
INTRAVENOUS | Status: DC | PRN
  Administered 2016-12-28: 30 mg via INTRAVENOUS
  Administered 2016-12-28 (×4): 20 mg via INTRAVENOUS

## 2016-12-28 MED ORDER — PROPOFOL 500 MG/50ML IV EMUL
INTRAVENOUS | Status: AC
  Filled 2016-12-28: qty 50

## 2016-12-28 MED ORDER — FENTANYL CITRATE (PF) 100 MCG/2ML IJ SOLN
INTRAMUSCULAR | Status: DC | PRN
Start: 2016-12-28 — End: 2016-12-28
  Administered 2016-12-28 (×2): 50 ug via INTRAVENOUS

## 2016-12-28 MED ORDER — LIDOCAINE HCL (PF) 2 % IJ SOLN
INTRAMUSCULAR | Status: AC
  Filled 2016-12-28: qty 2

## 2016-12-28 MED ORDER — LIDOCAINE HCL (PF) 2 % IJ SOLN
INTRAMUSCULAR | Status: DC | PRN
Start: 2016-12-28 — End: 2016-12-28
  Administered 2016-12-28: 50 mg

## 2016-12-28 MED ORDER — METHYLENE BLUE 1 % INJ SOLN
INTRAMUSCULAR | Status: AC
  Filled 2016-12-28: qty 10

## 2016-12-28 MED ORDER — FENTANYL CITRATE (PF) 100 MCG/2ML IJ SOLN
INTRAMUSCULAR | Status: AC
  Filled 2016-12-28: qty 2

## 2016-12-28 MED ORDER — MIDAZOLAM HCL 2 MG/2ML IJ SOLN
INTRAMUSCULAR | Status: AC
  Filled 2016-12-28: qty 2

## 2016-12-28 MED ORDER — MIDAZOLAM HCL 5 MG/5ML IJ SOLN
INTRAMUSCULAR | Status: DC | PRN
  Administered 2016-12-28: 2 mg via INTRAVENOUS

## 2016-12-28 MED ORDER — SODIUM CHLORIDE 0.9 % IV SOLN
INTRAVENOUS | Status: DC
  Administered 2016-12-28: 09:00:00 via INTRAVENOUS

## 2016-12-28 MED ORDER — EPHEDRINE SULFATE 50 MG/ML IJ SOLN
INTRAMUSCULAR | Status: DC | PRN
  Administered 2016-12-28: 5 mg via INTRAVENOUS

## 2016-12-28 NOTE — H&P (Signed)
  Jonathon Bellows MD [address] [place], [place] [address] Phone: [phone] Fax : [phone]  Primary Care Physician:  Arnetha Courser, MD Primary Gastroenterologist:  Dr. Jonathon Bellows   Pre-Procedure History & Physical: HPI:  Bethany Bowman is a 66 y.o. female is here for an colonoscopy.   Past Medical History:  Diagnosis Date  . Allergy   . GERD (gastroesophageal reflux disease)   . Hyperlipidemia   . Hypertension     History reviewed. No pertinent surgical history.  Prior to Admission medications   Medication Sig Start Date End Date Taking? Authorizing Provider  atenolol (TENORMIN) 50 MG tablet Take 1 tablet (50 mg total) by mouth daily. 08/24/16  Yes Lada, Satira Anis, MD  cholecalciferol (VITAMIN D) 1000 units tablet Take 1,000 Units by mouth daily.   Yes [provider]  lisinopril-hydrochlorothiazide (PRINZIDE,ZESTORETIC) 20-25 MG tablet TAKE 1 TABLET EVERY DAY 12/05/16  Yes Lada, Satira Anis, MD  potassium chloride (K-DUR,KLOR-CON) 10 MEQ tablet TAKE 1 TABLET EVERY DAY 12/05/16  Yes Lada, Satira Anis, MD  pravastatin (PRAVACHOL) 20 MG tablet TAKE 1 TABLET AT BEDTIME 12/05/16  Yes Lada, Satira Anis, MD    Allergies as of 11/10/2016  . (No Known Allergies)    Family History  Problem Relation Age of Onset  . Colon cancer Mother   . Hypertension Mother   . Alcohol abuse Father   . Hypertension Sister   . Hypertension Brother   . Lung cancer Brother   . Hypertension Brother   . Hypertension Brother   . Prostate cancer Brother   . Breast cancer Neg Hx     Social History   Social History  . Marital status: Married    Spouse name: N/A  . Number of children: N/A  . Years of education: N/A   Occupational History  . Not on file.   Social History Main Topics  . Smoking status: Former Smoker    Quit date: 08/03/1981  . Smokeless tobacco: Never Used  . Alcohol use No  . Drug use: No  . Sexual activity: Not Currently   Other Topics Concern  . Not on  file   Social History Narrative  . No narrative on file    Review of Systems: See HPI, otherwise negative ROS  Physical Exam: BP (!) 161/109   Pulse 60   Temp 97.9 F (36.6 C) (Tympanic)   Resp 18   Ht 5\' 10"  (1.778 m)   Wt 248 lb (112.5 kg)   LMP  (LMP Unknown)   SpO2 100%   BMI 35.58 kg/m  General:   Alert,  pleasant and cooperative in NAD Head:  Normocephalic and atraumatic. Neck:  Supple; no masses or thyromegaly. Lungs:  Clear throughout to auscultation.    Heart:  Regular rate and rhythm. Abdomen:  Soft, nontender and nondistended. Normal bowel sounds, without guarding, and without rebound.   Neurologic:  Alert and  oriented x4;  grossly normal neurologically.  Impression/Plan: Bethany Bowman is here for an colonoscopy to be performed for surveillance due to prior history of colon polyps   Risks, benefits, limitations, and alternatives regarding  colonoscopy have been reviewed with the patient.  Questions have been answered.  All parties agreeable.   Jonathon Bellows, MD  12/28/2016, 9:03 AM

## 2016-12-28 NOTE — Op Note (Signed)
[facility] Gastroenterology Patient Name: Bethany Bowman Procedure Date: 12/28/2016 9:00 AM MRN: [id number] Account #: [id number] Date of Birth: October 01, 1950 Admit Type: Outpatient Age: 66 Room: [facility] ENDO ROOM 4 Gender: Female Note Status: Finalized Procedure:            Colonoscopy Indications:          High risk colon cancer surveillance: Personal history                        of colonic polyps Providers:            Jonathon Bellows MD, MD Referring MD:         Arnetha Courser (Referring MD) Medicines:            Monitored Anesthesia Care Complications:        No immediate complications. Procedure:            Pre-Anesthesia Assessment:                       - Prior to the procedure, a History and Physical was                        performed, and patient medications, allergies and                        sensitivities were reviewed. The patient's tolerance of                        previous anesthesia was reviewed.                       - The risks and benefits of the procedure and the                        sedation options and risks were discussed with the                        patient. All questions were answered and informed                        consent was obtained.                       - ASA Grade Assessment: II - A patient with mild                        systemic disease.                       After obtaining informed consent, the colonoscope was                        passed under direct vision. Throughout the procedure,                        the patient's blood pressure, pulse, and oxygen                        saturations were monitored continuously. The                        Colonoscope  was introduced through the anus and                        advanced to the the cecum, identified by the                        appendiceal orifice, IC valve and transillumination.                        The colonoscopy was performed with ease. The patient           tolerated the procedure well. The quality of the bowel                        preparation was good. Findings:      Non-bleeding internal hemorrhoids were found. The hemorrhoids were small       and Grade I (internal hemorrhoids that do not prolapse).      A 1 mm polyp was found in the ascending colon. The polyp was sessile.       The polyp was removed with a cold snare. Resection and retrieval were       complete.      Three sessile polyps were found in the descending colon. The polyps were       5 to 7 mm in size. These polyps were removed with a cold snare.       Resection and retrieval were complete.      Five sessile polyps were found in the sigmoid colon. The polyps were 5       to 7 mm in size. These polyps were removed with a cold snare. Resection       and retrieval were complete. To repair the defect, the tissue edges were       approximated and two hemostatic clips were successfully placed. [Repair       Success]. There was no bleeding during, or at the end, of the procedure.      Multiple hyperplastic and sessile polyps were found in the rectum. The       polyps were 3 to 5 mm in size. These polyps were removed with a cold       snare. Resection and retrieval were complete.      The exam was otherwise without abnormality on direct and retroflexion       views. Impression:           - Non-bleeding internal hemorrhoids.                       - One 1 mm polyp in the ascending colon, removed with a                        cold snare. Resected and retrieved.                       - Three 5 to 7 mm polyps in the descending colon,                        removed with a cold snare. Resected and retrieved.                       - Five 5 to 7 mm polyps in the sigmoid colon, removed  with a cold snare. Resected and retrieved. Clips were                        placed.                       - Multiple 3 to 5 mm polyps in the rectum, removed with                         a cold snare. Resected and retrieved.                       - The examination was otherwise normal on direct and                        retroflexion views. Recommendation:       - Discharge patient to home (with escort).                       - Resume previous diet.                       - Continue present medications.                       - Await pathology results.                       - Repeat colonoscopy in 3 years for surveillance. Procedure Code(s):    --- Professional ---                       [phone], Colonoscopy, flexible; with removal of tumor(s),                        polyp(s), or other lesion(s) by snare technique Diagnosis Code(s):    --- Professional ---                       Z86.010, Personal history of colonic polyps                       D12.2, Benign neoplasm of ascending colon                       D12.4, Benign neoplasm of descending colon                       D12.5, Benign neoplasm of sigmoid colon                       K64.0, First degree hemorrhoids                       K62.1, Rectal polyp CPT copyright 2016 American Medical Association. All rights reserved. The codes documented in this report are preliminary and upon coder review may  be revised to meet current compliance requirements. Jonathon Bellows, MD Jonathon Bellows MD, MD 12/28/2016 9:57:04 AM This report has been signed electronically. Number of Addenda: 0 Note Initiated On: 12/28/2016 9:00 AM Scope Withdrawal Time: 0 hours 2 minutes 53 seconds  Total Procedure Duration: 0 hours 5 minutes 36 seconds       [facility]

## 2016-12-28 NOTE — Anesthesia Postprocedure Evaluation (Signed)
Anesthesia Post Note  Patient: Bethany Bowman  Procedure(s) Performed: Procedure(s) (LRB): COLONOSCOPY WITH PROPOFOL (N/A)  Patient location during evaluation: Endoscopy Anesthesia Type: General Level of consciousness: awake and alert and oriented Pain management: pain level controlled Vital Signs Assessment: post-procedure vital signs reviewed and stable Respiratory status: spontaneous breathing, nonlabored ventilation and respiratory function stable Cardiovascular status: blood pressure returned to baseline and stable Postop Assessment: no signs of nausea or vomiting Anesthetic complications: no     Last Vitals:  Vitals:   12/28/16 1015 12/28/16 1025  BP: 119/75 121/89  Pulse: (!) 56 (!) 58  Resp: 12 14  Temp:      Last Pain:  Vitals:   12/28/16 0955  TempSrc: Tympanic                 [name]

## 2016-12-28 NOTE — Transfer of Care (Signed)
Immediate Anesthesia Transfer of Care Note  Patient: Bethany Bowman  Procedure(s) Performed: Procedure(s): COLONOSCOPY WITH PROPOFOL (N/A)  Patient Location: PACU  Anesthesia Type:General  Level of Consciousness: sedated  Airway & Oxygen Therapy: Patient Spontanous Breathing and Patient connected to nasal cannula oxygen  Post-op Assessment: Report given to RN and Post -op Vital signs reviewed and stable  Post vital signs: Reviewed and stable  Last Vitals:  Vitals:   12/28/16 0830  BP: (!) 161/109  Pulse: 60  Resp: 18  Temp: 36.6 C    Last Pain:  Vitals:   12/28/16 0830  TempSrc: Tympanic         Complications: No apparent anesthesia complications

## 2016-12-28 NOTE — Anesthesia Preprocedure Evaluation (Signed)
Anesthesia Evaluation  Patient identified by MRN, date of birth, ID band Patient awake    Reviewed: Allergy & Precautions, NPO status , Patient's Chart, lab work & pertinent test results  History of Anesthesia Complications Negative for: history of anesthetic complications  Airway Mallampati: II  TM Distance: >3 FB Neck ROM: Full    Dental  (+) Upper Dentures, Lower Dentures   Pulmonary neg sleep apnea, neg COPD, former smoker,    breath sounds clear to auscultation- rhonchi (-) wheezing      Cardiovascular hypertension, Pt. on medications (-) CAD, (-) Past MI and (-) Cardiac Stents  Rhythm:Regular Rate:Normal - Systolic murmurs and - Diastolic murmurs    Neuro/Psych Anxiety negative neurological ROS     GI/Hepatic Neg liver ROS, GERD  ,  Endo/Other  negative endocrine ROSneg diabetes  Renal/GU negative Renal ROS     Musculoskeletal negative musculoskeletal ROS (+)   Abdominal (+) + obese,   Peds  Hematology negative hematology ROS (+)   Anesthesia Other Findings Past Medical History: No date: Allergy No date: GERD (gastroesophageal reflux disease) No date: Hyperlipidemia No date: Hypertension   Reproductive/Obstetrics                             Anesthesia Physical Anesthesia Plan  ASA: II  Anesthesia Plan: General   Post-op Pain Management:    Induction: Intravenous  PONV Risk Score and Plan: 2 and Propofol  Airway Management Planned: Natural Airway  Additional Equipment:   Intra-op Plan:   Post-operative Plan:   Informed Consent: I have reviewed the patients History and Physical, chart, labs and discussed the procedure including the risks, benefits and alternatives for the proposed anesthesia with the patient or authorized representative who has indicated his/her understanding and acceptance.   Dental advisory given  Plan Discussed with: CRNA and  Anesthesiologist  Anesthesia Plan Comments:         Anesthesia Quick Evaluation

## 2016-12-28 NOTE — Anesthesia Post-op Follow-up Note (Cosign Needed)
Anesthesia QCDR form completed.        

## 2016-12-29 ENCOUNTER — Encounter: Payer: Self-pay | Admitting: Gastroenterology

## 2016-12-29 LAB — SURGICAL PATHOLOGY

## 2017-01-09 ENCOUNTER — Telehealth: Payer: Self-pay

## 2017-01-09 NOTE — Telephone Encounter (Signed)
LVM for patient callback for results per Dr. Vicente Males.   Inform she had multiple polyps taken out any many were pre cancerous. In my endoscopy note had mentioned 3 year follow up but due to extensive polyps and family hiostory would like to repeat the next in 1 year to ensure no small ones were missed

## 2017-03-07 ENCOUNTER — Ambulatory Visit: Payer: Commercial Managed Care - HMO | Admitting: Family Medicine

## 2017-03-08 ENCOUNTER — Ambulatory Visit: Payer: Commercial Managed Care - HMO | Admitting: Family Medicine

## 2017-03-15 ENCOUNTER — Encounter: Payer: Self-pay | Admitting: Family Medicine

## 2017-03-15 ENCOUNTER — Ambulatory Visit (INDEPENDENT_AMBULATORY_CARE_PROVIDER_SITE_OTHER): Payer: Commercial Managed Care - HMO | Admitting: Family Medicine

## 2017-03-15 VITALS — BP 136/78 | HR 67 | Temp 98.6°F | Resp 16 | Ht 70.0 in | Wt 250.2 lb

## 2017-03-15 DIAGNOSIS — J343 Hypertrophy of nasal turbinates: Secondary | ICD-10-CM | POA: Diagnosis not present

## 2017-03-15 DIAGNOSIS — E669 Obesity, unspecified: Secondary | ICD-10-CM

## 2017-03-15 DIAGNOSIS — Z5181 Encounter for therapeutic drug level monitoring: Secondary | ICD-10-CM | POA: Diagnosis not present

## 2017-03-15 DIAGNOSIS — Z86010 Personal history of colonic polyps: Secondary | ICD-10-CM | POA: Diagnosis not present

## 2017-03-15 DIAGNOSIS — E781 Pure hyperglyceridemia: Secondary | ICD-10-CM | POA: Diagnosis not present

## 2017-03-15 DIAGNOSIS — I10 Essential (primary) hypertension: Secondary | ICD-10-CM

## 2017-03-15 DIAGNOSIS — Z9889 Other specified postprocedural states: Secondary | ICD-10-CM

## 2017-03-15 MED ORDER — PRAVASTATIN SODIUM 20 MG PO TABS: 20.0000 mg | ORAL_TABLET | Freq: Every day | ORAL | 1 refills | Status: DC

## 2017-03-15 MED ORDER — FLUTICASONE PROPIONATE 50 MCG/ACT NA SUSP: 2.0000 | Freq: Every day | NASAL | 6 refills | Status: DC

## 2017-03-15 MED ORDER — ATENOLOL 50 MG PO TABS: 50.0000 mg | ORAL_TABLET | Freq: Every day | ORAL | 1 refills | Status: DC

## 2017-03-15 MED ORDER — POTASSIUM CHLORIDE CRYS ER 10 MEQ PO TBCR: 10.0000 meq | EXTENDED_RELEASE_TABLET | Freq: Every day | ORAL | 1 refills | Status: DC

## 2017-03-15 MED ORDER — LISINOPRIL-HYDROCHLOROTHIAZIDE 20-25 MG PO TABS: 1.0000 | ORAL_TABLET | Freq: Every day | ORAL | 1 refills | Status: DC

## 2017-03-15 NOTE — Assessment & Plan Note (Signed)
Less active; encouraged weight loss

## 2017-03-15 NOTE — Assessment & Plan Note (Signed)
Check labs, not completely fasting, but will adjust based on egg and toast

## 2017-03-15 NOTE — Assessment & Plan Note (Signed)
Continue medicines.

## 2017-03-15 NOTE — Assessment & Plan Note (Addendum)
Check Cr and K+ 

## 2017-03-15 NOTE — Progress Notes (Signed)
BP 136/78 (BP Location: Left Arm, Cuff Size: Normal)   Pulse 67   Temp 98.6 F (37 C) (Oral)   Resp 16   Ht 5\' 10"  (1.778 m)   Wt 250 lb 3.2 oz (113.5 kg)   LMP  (LMP Unknown)   SpO2 98%   BMI 35.90 kg/m    Subjective:    Patient ID: Bethany Bowman, female    DOB: 12/31/1950, 65 y.o.   MRN: [id number]  HPI: Bethany Bowman is a 66 y.o. female  Chief Complaint  Patient presents with  . Follow-up    6 months     HPI Patient is here for f/u Works at [employer], checks every night she works Running better at work than at doctor, goes up at the doctor On lisinopril-hctz, atenolol Normally likes 3 month supply Trying to cut back on salt; does not add any at the table; that's better She does use decongestants, but has nasal stuffiness  High cholesterol; trying to limit fats; taking statin Did not come fasting; had egg and toast  Obesity; not losing weight  Depression screen [facility] 2/9 03/15/2017 08/24/2016 02/18/2016 03/02/2015 11/25/2014  Decreased Interest 0 0 0 0 0  Down, Depressed, Hopeless 0 0 0 0 0  PHQ - 2 Score 0 0 0 0 0    Relevant past medical, surgical, family and social history reviewed Past Medical History:  Diagnosis Date  . Allergy   . GERD (gastroesophageal reflux disease)   . Hyperlipidemia   . Hypertension    Past Surgical History:  Procedure Laterality Date  . COLONOSCOPY WITH PROPOFOL N/A 12/28/2016   Procedure: COLONOSCOPY WITH PROPOFOL;  Surgeon: Jonathon Bellows, MD;  Location: [facility] ENDOSCOPY;  Service: Endoscopy;  Laterality: N/A;   Family History  Problem Relation Age of Onset  . Colon cancer Mother   . Hypertension Mother   . Alcohol abuse Father   . Hypertension Sister   . Hypertension Brother   . Lung cancer Brother   . Hypertension Brother   . Hypertension Brother   . Prostate cancer Brother   . Breast cancer Neg Hx    Social History   Social History  . Marital status: Married    Spouse name: N/A  . Number of children: N/A  . Years of  education: N/A   Occupational History  . Not on file.   Social History Main Topics  . Smoking status: Former Smoker    Quit date: 08/03/1981  . Smokeless tobacco: Never Used  . Alcohol use No  . Drug use: No  . Sexual activity: Not Currently   Other Topics Concern  . Not on file   Social History Narrative  . No narrative on file    Interim medical history since last visit reviewed. Allergies and medications reviewed  Review of Systems  Constitutional: Negative for unexpected weight change.  Cardiovascular: Negative for chest pain and leg swelling.   Per HPI unless specifically indicated above     Objective:    BP 136/78 (BP Location: Left Arm, Cuff Size: Normal)   Pulse 67   Temp 98.6 F (37 C) (Oral)   Resp 16   Ht 5\' 10"  (1.778 m)   Wt 250 lb 3.2 oz (113.5 kg)   LMP  (LMP Unknown)   SpO2 98%   BMI 35.90 kg/m   Wt Readings from Last 3 Encounters:  03/15/17 250 lb 3.2 oz (113.5 kg)  12/28/16 248 lb (112.5 kg)  08/24/16 248  lb 9 oz (112.7 kg)    Physical Exam  Constitutional: She appears well-developed and well-nourished. No distress.  Eyes: EOM are normal. No scleral icterus.  Neck: No thyromegaly present.  Cardiovascular: Normal rate, regular rhythm and normal heart sounds.   No murmur heard. Pulmonary/Chest: Effort normal and breath sounds normal.  Abdominal: Soft. Bowel sounds are normal. She exhibits no distension.  Musculoskeletal: She exhibits no edema.  Neurological: She is alert. She exhibits normal muscle tone.  Skin: Skin is warm and dry. She is not diaphoretic. No pallor.  Psychiatric: She has a normal mood and affect. Her behavior is normal. Judgment and thought content normal.       Assessment & Plan:   Problem List Items Addressed This Visit      Cardiovascular and Mediastinum   Benign essential HTN - Primary    Continue medicines      Relevant Medications   lisinopril-hydrochlorothiazide (PRINZIDE,ZESTORETIC) 20-25 MG tablet    atenolol (TENORMIN) 50 MG tablet   aspirin EC 81 MG tablet     Other   Obesity (BMI 35.0-39.9 without comorbidity)    Less active; encouraged weight loss      Hypertriglyceridemia    Check labs, not completely fasting, but will adjust based on egg and toast      Relevant Medications   lisinopril-hydrochlorothiazide (PRINZIDE,ZESTORETIC) 20-25 MG tablet   atenolol (TENORMIN) 50 MG tablet   aspirin EC 81 MG tablet   Other Relevant Orders   Lipid panel (Completed)   History of colonoscopy with polypectomy   Encounter for medication monitoring    Check Cr and K+      Relevant Orders   Basic metabolic panel (Completed)    Other Visit Diagnoses    Nasal turbinate hypertrophy           Follow up plan: Return in about 6 months (around 09/12/2017) for twenty minute follow-up with fasting labs.  An after-visit summary was printed and given to the patient at [place].  Please see the patient instructions which may contain other information and recommendations beyond what is mentioned above in the assessment and plan.  Meds ordered this encounter  Medications  . lisinopril-hydrochlorothiazide (PRINZIDE,ZESTORETIC) 20-25 MG tablet    Sig: Take 1 tablet by mouth daily.    Dispense:  90 tablet    Refill:  1  . DISCONTD: pravastatin (PRAVACHOL) 20 MG tablet    Sig: Take 1 tablet (20 mg total) by mouth at bedtime.    Dispense:  90 tablet    Refill:  1  . potassium chloride (K-DUR,KLOR-CON) 10 MEQ tablet    Sig: Take 1 tablet (10 mEq total) by mouth daily.    Dispense:  90 tablet    Refill:  1  . atenolol (TENORMIN) 50 MG tablet    Sig: Take 1 tablet (50 mg total) by mouth daily.    Dispense:  90 tablet    Refill:  1  . aspirin EC 81 MG tablet    Sig: Take 1 tablet (81 mg total) by mouth daily.  . fluticasone (FLONASE) 50 MCG/ACT nasal spray    Sig: Place 2 sprays into both nostrils daily.    Dispense:  16 g    Refill:  6    Orders Placed This Encounter  Procedures  .  Lipid panel  . Basic metabolic panel

## 2017-03-15 NOTE — Patient Instructions (Addendum)
Try to use PLAIN allergy medicine without the decongestant Avoid: phenylephrine, phenylpropanolamine, and pseudoephredine Check out the information at familydoctor.org entitled "Nutrition for Weight Loss: What You Need to Know about Fad Diets" Try to lose between 1-2 pounds per week by taking in fewer calories and burning off more calories You can succeed by limiting portions, limiting foods dense in calories and fat, becoming more active, and drinking 8 glasses of water a day (64 ounces) Don't skip meals, especially breakfast, as skipping meals may alter your metabolism Do not use over-the-counter weight loss pills or gimmicks that claim rapid weight loss A healthy BMI (or body mass index) is between 18.5 and 24.9 You can calculate your ideal BMI at the [place] website ClubMonetize.fr Check out the information at familydoctor.org entitled "Nutrition for Weight Loss: What You Need to Know about Fad Diets" Try to lose between 1-2 pounds per week by taking in fewer calories and burning off more calories You can succeed by limiting portions, limiting foods dense in calories and fat, becoming more active, and drinking 8 glasses of water a day (64 ounces) Don't skip meals, especially breakfast, as skipping meals may alter your metabolism Do not use over-the-counter weight loss pills or gimmicks that claim rapid weight loss A healthy BMI (or body mass index) is between 18.5 and 24.9 You can calculate your ideal BMI at the [place] website ClubMonetize.fr  DASH Eating Plan DASH stands for "Dietary Approaches to Stop Hypertension." The DASH eating plan is a healthy eating plan that has been shown to reduce high blood pressure (hypertension). It may also reduce your risk for type 2 diabetes, heart disease, and stroke. The DASH eating plan may also help with weight loss. What are tips for following this  plan? General guidelines  Avoid eating more than 2,300 mg (milligrams) of salt (sodium) a day. If you have hypertension, you may need to reduce your sodium intake to 1,500 mg a day.  Limit alcohol intake to no more than 1 drink a day for nonpregnant women and 2 drinks a day for men. One drink equals 12 oz of beer, 5 oz of wine, or 1 oz of hard liquor.  Work with your health care provider to maintain a healthy body weight or to lose weight. Ask what an ideal weight is for you.  Get at least 30 minutes of exercise that causes your heart to beat faster (aerobic exercise) most days of the week. Activities may include walking, swimming, or biking.  Work with your health care provider or diet and nutrition specialist (dietitian) to adjust your eating plan to your individual calorie needs. Reading food labels  Check food labels for the amount of sodium per serving. Choose foods with less than 5 percent of the Daily Value of sodium. Generally, foods with less than 300 mg of sodium per serving fit into this eating plan.  To find whole grains, look for the word "whole" as the first word in the ingredient list. Shopping  Buy products labeled as "low-sodium" or "no salt added."  Buy fresh foods. Avoid canned foods and premade or frozen meals. Cooking  Avoid adding salt when cooking. Use salt-free seasonings or herbs instead of table salt or sea salt. Check with your health care provider or pharmacist before using salt substitutes.  Do not fry foods. Cook foods using healthy methods such as baking, boiling, grilling, and broiling instead.  Cook with heart-healthy oils, such as olive, canola, soybean, or sunflower oil. Meal planning   Eat  a balanced diet that includes: ? 5 or more servings of fruits and vegetables each day. At each meal, try to fill half of your plate with fruits and vegetables. ? Up to 6-8 servings of whole grains each day. ? Less than 6 oz of lean meat, poultry, or fish each  day. A 3-oz serving of meat is about the same size as a deck of cards. One egg equals 1 oz. ? 2 servings of low-fat dairy each day. ? A serving of nuts, seeds, or beans 5 times each week. ? Heart-healthy fats. Healthy fats called Omega-3 fatty acids are found in foods such as flaxseeds and coldwater fish, like sardines, salmon, and mackerel.  Limit how much you eat of the following: ? Canned or prepackaged foods. ? Food that is high in trans fat, such as fried foods. ? Food that is high in saturated fat, such as fatty meat. ? Sweets, desserts, sugary drinks, and other foods with added sugar. ? Full-fat dairy products.  Do not salt foods before eating.  Try to eat at least 2 vegetarian meals each week.  Eat more home-cooked food and less restaurant, buffet, and fast food.  When eating at a restaurant, ask that your food be prepared with less salt or no salt, if possible. What foods are recommended? The items listed may not be a complete list. Talk with your dietitian about what dietary choices are best for you. Grains Whole-grain or whole-wheat bread. Whole-grain or whole-wheat pasta. Brown rice. [place]. Bulgur. Whole-grain and low-sodium cereals. Pita bread. Low-fat, low-sodium crackers. Whole-wheat flour tortillas. Vegetables Fresh or frozen vegetables (raw, steamed, roasted, or grilled). Low-sodium or reduced-sodium tomato and vegetable juice. Low-sodium or reduced-sodium tomato sauce and tomato paste. Low-sodium or reduced-sodium canned vegetables. Fruits All fresh, dried, or frozen fruit. Canned fruit in natural juice (without added sugar). Meat and other protein foods Skinless chicken or [place]. Ground chicken or [place]. Pork with fat trimmed off. Fish and seafood. Egg whites. Dried beans, peas, or lentils. Unsalted nuts, nut butters, and seeds. Unsalted canned beans. Lean cuts of beef with fat trimmed off. Low-sodium, lean deli meat. Dairy Low-fat (1%) or fat-free (skim)  milk. Fat-free, low-fat, or reduced-fat cheeses. Nonfat, low-sodium ricotta or cottage cheese. Low-fat or nonfat yogurt. Low-fat, low-sodium cheese. Fats and oils Soft margarine without trans fats. Vegetable oil. Low-fat, reduced-fat, or light mayonnaise and salad dressings (reduced-sodium). Canola, safflower, olive, soybean, and sunflower oils. Avocado. Seasoning and other foods Herbs. Spices. Seasoning mixes without salt. Unsalted popcorn and pretzels. Fat-free sweets. What foods are not recommended? The items listed may not be a complete list. Talk with your dietitian about what dietary choices are best for you. Grains Baked goods made with fat, such as croissants, muffins, or some breads. Dry pasta or rice meal packs. Vegetables Creamed or fried vegetables. Vegetables in a cheese sauce. Regular canned vegetables (not low-sodium or reduced-sodium). Regular canned tomato sauce and paste (not low-sodium or reduced-sodium). Regular tomato and vegetable juice (not low-sodium or reduced-sodium). Angie Fava. Olives. Fruits Canned fruit in a light or heavy syrup. Fried fruit. Fruit in cream or butter sauce. Meat and other protein foods Fatty cuts of meat. Ribs. Fried meat. Berniece Salines. Sausage. Bologna and other processed lunch meats. Salami. Fatback. Hotdogs. Bratwurst. Salted nuts and seeds. Canned beans with added salt. Canned or smoked fish. Whole eggs or egg yolks. Chicken or [place] with skin. Dairy Whole or 2% milk, cream, and half-and-half. Whole or full-fat cream cheese. Whole-fat or sweetened yogurt. Full-fat  cheese. Nondairy creamers. Whipped toppings. Processed cheese and cheese spreads. Fats and oils Butter. Stick margarine. Lard. Shortening. Ghee. Bacon fat. Tropical oils, such as coconut, palm kernel, or palm oil. Seasoning and other foods Salted popcorn and pretzels. Onion salt, garlic salt, seasoned salt, table salt, and sea salt. Worcestershire sauce. Tartar sauce. Barbecue sauce. Teriyaki  sauce. Soy sauce, including reduced-sodium. Steak sauce. Canned and packaged gravies. Fish sauce. Oyster sauce. Cocktail sauce. Horseradish that you find on the shelf. Ketchup. Mustard. Meat flavorings and tenderizers. Bouillon cubes. Hot sauce and Tabasco sauce. Premade or packaged marinades. Premade or packaged taco seasonings. Relishes. Regular salad dressings. Where to find more information:  National Heart, Lung, and [place]: https://wilson-eaton.com/  American Heart Association: www.heart.org Summary  The DASH eating plan is a healthy eating plan that has been shown to reduce high blood pressure (hypertension). It may also reduce your risk for type 2 diabetes, heart disease, and stroke.  With the DASH eating plan, you should limit salt (sodium) intake to 2,300 mg a day. If you have hypertension, you may need to reduce your sodium intake to 1,500 mg a day.  When on the DASH eating plan, aim to eat more fresh fruits and vegetables, whole grains, lean proteins, low-fat dairy, and heart-healthy fats.  Work with your health care provider or diet and nutrition specialist (dietitian) to adjust your eating plan to your individual calorie needs. This information is not intended to replace advice given to you by your health care provider. Make sure you discuss any questions you have with your health care provider. Document Released: 05/25/2011 Document Revised: 05/29/2016 Document Reviewed: 05/29/2016 Elsevier Interactive Patient Education  2017 [employer].

## 2017-03-16 LAB — BASIC METABOLIC PANEL
BUN: 15 mg/dL (ref 7–25)
CHLORIDE: 103 mmol/L (ref 98–110)
CO2: 32 mmol/L (ref 20–32)
CREATININE: 0.97 mg/dL (ref 0.50–0.99)
Calcium: 9.9 mg/dL (ref 8.6–10.4)
Glucose, Bld: 92 mg/dL (ref 65–99)
Potassium: 4.2 mmol/L (ref 3.5–5.3)
Sodium: 143 mmol/L (ref 135–146)

## 2017-03-16 LAB — LIPID PANEL
CHOL/HDL RATIO: 4.1 (calc) (ref <5.0)
Cholesterol: 212 mg/dL — ABNORMAL HIGH (ref <200)
HDL: 52 mg/dL (ref >50)
LDL Cholesterol (Calc): 125 mg/dL (calc) — ABNORMAL HIGH
NON-HDL CHOLESTEROL (CALC): 160 mg/dL — AB (ref <130)
Triglycerides: 212 mg/dL — ABNORMAL HIGH (ref <150)

## 2017-03-20 ENCOUNTER — Other Ambulatory Visit: Payer: Self-pay | Admitting: Family Medicine

## 2017-03-20 DIAGNOSIS — Z5181 Encounter for therapeutic drug level monitoring: Secondary | ICD-10-CM

## 2017-03-20 DIAGNOSIS — E785 Hyperlipidemia, unspecified: Secondary | ICD-10-CM

## 2017-03-20 DIAGNOSIS — E781 Pure hyperglyceridemia: Secondary | ICD-10-CM

## 2017-03-20 MED ORDER — PRAVASTATIN SODIUM 40 MG PO TABS: 40.0000 mg | ORAL_TABLET | Freq: Every day | ORAL | 1 refills | Status: DC

## 2017-03-20 NOTE — Progress Notes (Signed)
Increase statin Recheck labs in 6-8 weeks

## 2017-05-04 ENCOUNTER — Other Ambulatory Visit: Payer: Self-pay | Admitting: Family Medicine

## 2017-05-04 NOTE — Telephone Encounter (Signed)
Please remind patient to get fasting labs any time now (due on or shortly after 05/02/17) Many patient prefer to get their labs done before Thanksgiving Thank you

## 2017-05-07 NOTE — Telephone Encounter (Signed)
Called pt's number listed in chart female answers and states that this is no longer her number. Called Emergency contact number. No answer. No voicemail. Will mail letter.

## 2017-08-07 NOTE — Telephone Encounter (Signed)
Signing off on old note; provider no longer at this practice

## 2017-08-07 NOTE — Telephone Encounter (Signed)
Signing off on old encounter for 2017; provider no longer at this practice

## 2017-08-07 NOTE — Progress Notes (Signed)
Closing out lab/order note open since:  2017

## 2017-09-12 ENCOUNTER — Ambulatory Visit (INDEPENDENT_AMBULATORY_CARE_PROVIDER_SITE_OTHER): Payer: Medicare HMO | Admitting: Family Medicine

## 2017-09-12 ENCOUNTER — Telehealth: Payer: Self-pay

## 2017-09-12 ENCOUNTER — Encounter: Payer: Self-pay | Admitting: Family Medicine

## 2017-09-12 VITALS — BP 138/86 | HR 66 | Temp 98.4°F | Resp 14 | Ht 70.0 in | Wt 244.9 lb

## 2017-09-12 DIAGNOSIS — I10 Essential (primary) hypertension: Secondary | ICD-10-CM | POA: Diagnosis not present

## 2017-09-12 DIAGNOSIS — E669 Obesity, unspecified: Secondary | ICD-10-CM

## 2017-09-12 DIAGNOSIS — G2581 Restless legs syndrome: Secondary | ICD-10-CM | POA: Diagnosis not present

## 2017-09-12 DIAGNOSIS — E8881 Metabolic syndrome: Secondary | ICD-10-CM

## 2017-09-12 DIAGNOSIS — Z1231 Encounter for screening mammogram for malignant neoplasm of breast: Secondary | ICD-10-CM | POA: Diagnosis not present

## 2017-09-12 DIAGNOSIS — E781 Pure hyperglyceridemia: Secondary | ICD-10-CM | POA: Diagnosis not present

## 2017-09-12 DIAGNOSIS — E785 Hyperlipidemia, unspecified: Secondary | ICD-10-CM

## 2017-09-12 DIAGNOSIS — Z1239 Encounter for other screening for malignant neoplasm of breast: Secondary | ICD-10-CM

## 2017-09-12 DIAGNOSIS — Z5181 Encounter for therapeutic drug level monitoring: Secondary | ICD-10-CM

## 2017-09-12 LAB — CBC WITH DIFFERENTIAL/PLATELET
BASOS ABS: 50 {cells}/uL (ref 0–200)
BASOS PCT: 0.9 %
Eosinophils Absolute: 179 cells/uL (ref 15–500)
Eosinophils Relative: 3.2 %
HEMATOCRIT: 41 % (ref 35.0–45.0)
HEMOGLOBIN: 13.8 g/dL (ref 11.7–15.5)
LYMPHS ABS: 2061 {cells}/uL (ref 850–3900)
MCH: 27.1 pg (ref 27.0–33.0)
MCHC: 33.7 g/dL (ref 32.0–36.0)
MCV: 80.4 fL (ref 80.0–100.0)
MONOS PCT: 7.2 %
MPV: 9.7 fL (ref 7.5–12.5)
NEUTROS ABS: 2906 {cells}/uL (ref 1500–7800)
Neutrophils Relative %: 51.9 %
Platelets: 245 10*3/uL (ref 140–400)
RBC: 5.1 10*6/uL (ref 3.80–5.10)
RDW: 13.6 % (ref 11.0–15.0)
Total Lymphocyte: 36.8 %
WBC mixed population: 403 cells/uL (ref 200–950)
WBC: 5.6 10*3/uL (ref 3.8–10.8)

## 2017-09-12 LAB — COMPLETE METABOLIC PANEL WITH GFR
AG RATIO: 1.4 (calc) (ref 1.0–2.5)
ALBUMIN MSPROF: 4.2 g/dL (ref 3.6–5.1)
ALKALINE PHOSPHATASE (APISO): 47 U/L (ref 33–130)
ALT: 15 U/L (ref 6–29)
AST: 17 U/L (ref 10–35)
BUN: 15 mg/dL (ref 7–25)
CO2: 30 mmol/L (ref 20–32)
Calcium: 10 mg/dL (ref 8.6–10.4)
Chloride: 104 mmol/L (ref 98–110)
Creat: 0.92 mg/dL (ref 0.50–0.99)
GFR, Est African American: 74 mL/min/{1.73_m2} (ref >=60)
GFR, Est Non African American: 64 mL/min/{1.73_m2} (ref >=60)
GLOBULIN: 3 g/dL (ref 1.9–3.7)
Glucose, Bld: 97 mg/dL (ref 65–99)
POTASSIUM: 3.6 mmol/L (ref 3.5–5.3)
SODIUM: 142 mmol/L (ref 135–146)
Total Bilirubin: 0.6 mg/dL (ref 0.2–1.2)
Total Protein: 7.2 g/dL (ref 6.1–8.1)

## 2017-09-12 LAB — LIPID PANEL
Cholesterol: 186 mg/dL (ref <200)
HDL: 47 mg/dL — AB (ref >50)
LDL Cholesterol (Calc): 116 mg/dL (calc) — ABNORMAL HIGH
NON-HDL CHOLESTEROL (CALC): 139 mg/dL — AB (ref <130)
Total CHOL/HDL Ratio: 4 (calc) (ref <5.0)
Triglycerides: 123 mg/dL (ref <150)

## 2017-09-12 NOTE — Progress Notes (Signed)
BP 138/86   Pulse 66   Temp 98.4 F (36.9 C) (Oral)   Resp 14   Ht 5\' 10"  (1.778 m)   Wt 244 lb 14.4 oz (111.1 kg)   LMP  (LMP Unknown)   SpO2 98%   BMI 35.14 kg/m    Subjective:    Patient ID: Bethany Bowman, female    DOB: [birth year], 67 y.o.   MRN: [id number]  HPI: Bethany Bowman is a 67 y.o. female  Chief Complaint  Patient presents with  . Follow-up    6 months  . restless legs    can't stop moving them at night to go to bed? she cut her chol med back to 20mg     HPI Patient is here for f/u; had the crud this winter, other than that, doing great; no flu  Hypertension On beta-blocker, ACE-thiazide combo; cutting back significantly on salt  High cholesterol Taking statin Lab Results  Component Value Date   CHOL 212 (H) 03/15/2017   HDL 52 03/15/2017   LDLCALC 125 (H) 03/15/2017   TRIG 212 (H) 03/15/2017   CHOLHDL 4.1 03/15/2017   She was having restless legs at night and cut her statin back by 50% to see if that would help; she can't explain it; does not know what to say; the last time she was here, her statin was increased and that's when it started the medicine; she went back to 20 mg; no pain anymore; wasn't unbearable, but it was uncomfortable; worse at night, hard to find comfortable position; legs keep want to keep moving; no bleeding; not half as bad now that she's reduced the medicine; 75% better  Obesity She is down 6 pounds from last visit; she has started walking; she has started eating better, it's a start she says; she keeps her 67 year-old [place] and when the weather gets better, she'll start walking with him  Depression screen [facility] 2/9 09/12/2017 03/15/2017 08/24/2016 02/18/2016 03/02/2015  Decreased Interest 0 0 0 0 0  Down, Depressed, Hopeless 0 0 0 0 0  PHQ - 2 Score 0 0 0 0 0    Relevant past medical, surgical, family and social history reviewed Past Medical History:  Diagnosis Date  . Allergy   . GERD (gastroesophageal reflux disease)   .  Hyperlipidemia   . Hypertension    Past Surgical History:  Procedure Laterality Date  . COLONOSCOPY WITH PROPOFOL N/A 12/28/2016   Procedure: COLONOSCOPY WITH PROPOFOL;  Surgeon: Jonathon Bellows, MD;  Location: [facility] ENDOSCOPY;  Service: Endoscopy;  Laterality: N/A;   Family History  Problem Relation Age of Onset  . Colon cancer Mother   . Hypertension Mother   . Alcohol abuse Father   . Hypertension Sister   . Hypertension Brother   . Prostate cancer Brother   . Bone cancer Brother   . Lung cancer Brother   . Hypertension Brother   . Hypertension Brother   . Prostate cancer Brother   . Breast cancer Neg Hx    Social History   Tobacco Use  . Smoking status: Former Smoker    Last attempt to quit: 08/03/1981    Years since quitting: 36.1  . Smokeless tobacco: Never Used  Substance Use Topics  . Alcohol use: No    Alcohol/week: 0.0 oz  . Drug use: No    Interim medical history since last visit reviewed. Allergies and medications reviewed  Review of Systems  HENT: Negative for nosebleeds.   Gastrointestinal: Negative  for blood in stool.  Genitourinary: Negative for hematuria.   Per HPI unless specifically indicated above     Objective:    BP 138/86   Pulse 66   Temp 98.4 F (36.9 C) (Oral)   Resp 14   Ht 5\' 10"  (1.778 m)   Wt 244 lb 14.4 oz (111.1 kg)   LMP  (LMP Unknown)   SpO2 98%   BMI 35.14 kg/m   Wt Readings from Last 3 Encounters:  09/12/17 244 lb 14.4 oz (111.1 kg)  03/15/17 250 lb 3.2 oz (113.5 kg)  12/28/16 248 lb (112.5 kg)    Physical Exam  Constitutional: She appears well-developed and well-nourished. No distress.  HENT:  Head: Normocephalic and atraumatic.  Eyes: EOM are normal. No scleral icterus.  Neck: No thyromegaly present.  Cardiovascular: Normal rate, regular rhythm and normal heart sounds.  No murmur heard. Pulmonary/Chest: Effort normal and breath sounds normal. No respiratory distress. She has no wheezes.  Abdominal: Soft. Bowel  sounds are normal. She exhibits no distension.  Musculoskeletal: Normal range of motion. She exhibits no edema.  Neurological: She is alert. She exhibits normal muscle tone.  Skin: Skin is warm and dry. She is not diaphoretic. No pallor.  Psychiatric: She has a normal mood and affect. Her behavior is normal. Judgment and thought content normal.    Results for orders placed or performed in visit on 03/15/17  Lipid panel  Result Value Ref Range   Cholesterol 212 (H) <200 mg/dL   HDL 52 >50 mg/dL   Triglycerides 212 (H) <150 mg/dL   LDL Cholesterol (Calc) 125 (H) mg/dL (calc)   Total CHOL/HDL Ratio 4.1 <5.0 (calc)   Non-HDL Cholesterol (Calc) 160 (H) <130 mg/dL (calc)  Basic metabolic panel  Result Value Ref Range   Glucose, Bld 92 65 - 99 mg/dL   BUN 15 7 - 25 mg/dL   Creat 0.97 0.50 - 0.99 mg/dL   BUN/Creatinine Ratio NOT APPLICABLE 6 - 22 (calc)   Sodium 143 135 - 146 mmol/L   Potassium 4.2 3.5 - 5.3 mmol/L   Chloride 103 98 - 110 mmol/L   CO2 32 20 - 32 mmol/L   Calcium 9.9 8.6 - 10.4 mg/dL      Assessment & Plan:   Problem List Items Addressed This Visit      Cardiovascular and Mediastinum   Benign essential HTN - Primary    Fair control today; work on weight loss; try DASH guidelines      Relevant Medications   pravastatin (PRAVACHOL) 40 MG tablet     Other   Encounter for medication monitoring   Relevant Orders   COMPLETE METABOLIC PANEL WITH GFR   Restless legs    Check CBC, iron studies      Relevant Orders   CBC with Differential/Platelet   Fe+TIBC+Fer   Obesity (BMI 35.0-39.9 without comorbidity)    Encouraged weight loss; see AVS      Hypertriglyceridemia    Weight loss, healthy eating encouraged      Relevant Medications   pravastatin (PRAVACHOL) 40 MG tablet   Other Relevant Orders   Lipid panel   Hyperlipidemia LDL goal <100    Check lipids today; continue statin; work on weight loss      Relevant Medications   pravastatin (PRAVACHOL)  40 MG tablet   Dysmetabolic syndrome    Check glucose, lipids, encouraged weight loss      Relevant Orders   Lipid panel    Other  Visit Diagnoses    Screening for breast cancer       Relevant Orders   MM Digital Screening       Follow up plan: Return in about 6 months (around 03/15/2018) for follow-up visit with Dr. Sanda Klein; Medicare Wellness soon [facility]).  An after-visit summary was printed and given to the patient at [place].  Please see the patient instructions which may contain other information and recommendations beyond what is mentioned above in the assessment and plan.  No orders of the defined types were placed in this encounter.   Orders Placed This Encounter  Procedures  . MM Digital Screening  . COMPLETE METABOLIC PANEL WITH GFR  . Lipid panel  . CBC with Differential/Platelet  . Fe+TIBC+Fer

## 2017-09-12 NOTE — Assessment & Plan Note (Signed)
Check lipids today; continue statin; work on weight loss

## 2017-09-12 NOTE — Assessment & Plan Note (Signed)
Weight loss, healthy eating encouraged

## 2017-09-12 NOTE — Assessment & Plan Note (Signed)
Fair control today; work on weight loss; try DASH guidelines

## 2017-09-12 NOTE — Telephone Encounter (Signed)
-----   Message from Arnetha Courser, MD sent at 09/12/2017  4:05 PM EDT ----- Guerry Minors, please let pt know that her CBC is totally normal; thank you

## 2017-09-12 NOTE — Telephone Encounter (Signed)
Called pt informed her of information below. Pt gave verbal understanding.  

## 2017-09-12 NOTE — Assessment & Plan Note (Signed)
Check CBC, iron studies 

## 2017-09-12 NOTE — Assessment & Plan Note (Signed)
Check glucose, lipids, encouraged weight loss

## 2017-09-12 NOTE — Assessment & Plan Note (Signed)
Encouraged weight loss; see AVS 

## 2017-09-12 NOTE — Patient Instructions (Addendum)
Check out the information at familydoctor.org entitled "Nutrition for Weight Loss: What You Need to Know about Fad Diets" Try to lose between 1-2 pounds per week by taking in fewer calories and burning off more calories You can succeed by limiting portions, limiting foods dense in calories and fat, becoming more active, and drinking 8 glasses of water a day (64 ounces) Don't skip meals, especially breakfast, as skipping meals may alter your metabolism Do not use over-the-counter weight loss pills or gimmicks that claim rapid weight loss A healthy BMI (or body mass index) is between 18.5 and 24.9 You can calculate your ideal BMI at the [place] website [employer]fr  If you have not heard anything from my staff in a week about any orders/referrals/studies from today, please contact us here to follow-up [phone]  Please do call to schedule your mammogram; the number to schedule one at either [facility] or [facility] is [phone]  Try to follow the DASH guidelines (DASH stands for Dietary Approaches to Stop Hypertension). Try to limit the sodium in your diet to no more than 1,500mg  of sodium per day. Certainly try to not exceed 2,000 mg per day at the very most. Do not add salt when cooking or at the table.  Check the sodium amount on labels when shopping, and choose items lower in sodium when given a choice. Avoid or limit foods that already contain a lot of sodium. Eat a diet rich in fruits and vegetables and whole grains, and try to lose weight if overweight or obese   Obesity, Adult Obesity is the condition of having too much total body fat. Being overweight or obese means that your weight is greater than what is considered healthy for your body size. Obesity is determined by a measurement called BMI. BMI is an estimate of body fat and is calculated from height and weight. For adults, a BMI of 30 or higher is  considered obese. Obesity can eventually lead to other health concerns and major illnesses, including:  Stroke.  Coronary artery disease (CAD).  Type 2 diabetes.  Some types of cancer, including cancers of the colon, breast, uterus, and gallbladder.  Osteoarthritis.  High blood pressure (hypertension).  High cholesterol.  Sleep apnea.  Gallbladder stones.  Infertility problems.  What are the causes? The main cause of obesity is taking in (consuming) more calories than your body uses for energy. Other factors that contribute to this condition may include:  Being born with genes that make you more likely to become obese.  Having a medical condition that causes obesity. These conditions include: ? Hypothyroidism. ? Polycystic ovarian syndrome (PCOS). ? Binge-eating disorder. ? Cushing syndrome.  Taking certain medicines, such as steroids, antidepressants, and seizure medicines.  Not being physically active (sedentary lifestyle).  Living where there are limited places to exercise safely or buy healthy foods.  Not getting enough sleep.  What increases the risk? The following factors may increase your risk of this condition:  Having a family history of obesity.  Being a woman of African-American descent.  Being a man of Hispanic descent.  What are the signs or symptoms? Having excessive body fat is the main symptom of this condition. How is this diagnosed? This condition may be diagnosed based on:  Your symptoms.  Your medical history.  A physical exam. Your health care provider may measure: ? Your BMI. If you are an adult with a BMI between 25 and less than 30, you are considered overweight.  If you are an adult with a BMI of 30 or higher, you are considered obese. ? The distances around your hips and your waist (circumferences). These may be compared to each other to help diagnose your condition. ? Your skinfold thickness. Your health care provider may gently  pinch a fold of your skin and measure it.  How is this treated? Treatment for this condition often includes changing your lifestyle. Treatment may include some or all of the following:  Dietary changes. Work with your health care provider and a dietitian to set a weight-loss goal that is healthy and reasonable for you. Dietary changes may include eating: ? Smaller portions. A portion size is the amount of a particular food that is healthy for you to eat at one time. This varies from person to person. ? Low-calorie or low-fat options. ? More whole grains, fruits, and vegetables.  Regular physical activity. This may include aerobic activity (cardio) and strength training.  Medicine to help you lose weight. Your health care provider may prescribe medicine if you are unable to lose 1 pound a week after 6 weeks of eating more healthily and doing more physical activity.  Surgery. Surgical options may include gastric banding and gastric bypass. Surgery may be done if: ? Other treatments have not helped to improve your condition. ? You have a BMI of 40 or higher. ? You have life-threatening health problems related to obesity.  Follow these instructions at home:  Eating and drinking   Follow recommendations from your health care provider about what you eat and drink. Your health care provider may advise you to: ? Limit fast foods, sweets, and processed snack foods. ? Choose low-fat options, such as low-fat milk instead of whole milk. ? Eat 5 or more servings of fruits or vegetables every day. ? Eat at home more often. This gives you more control over what you eat. ? Choose healthy foods when you eat out. ? Learn what a healthy portion size is. ? Keep low-fat snacks on hand. ? Avoid sugary drinks, such as soda, fruit juice, iced tea sweetened with sugar, and flavored milk. ? Eat a healthy breakfast.  Drink enough water to keep your urine clear or pale yellow.  Do not go without eating for  long periods of time (do not fast) or follow a fad diet. Fasting and fad diets can be unhealthy and even dangerous. Physical Activity  Exercise regularly, as told by your health care provider. Ask your health care provider what types of exercise are safe for you and how often you should exercise.  Warm up and stretch before being active.  Cool down and stretch after being active.  Rest between periods of activity. Lifestyle  Limit the time that you spend in front of your TV, computer, or video game system.  Find ways to reward yourself that do not involve food.  Limit alcohol intake to no more than 1 drink a day for nonpregnant women and 2 drinks a day for men. One drink equals 12 oz of beer, 5 oz of wine, or 1 oz of hard liquor. General instructions  Keep a weight loss journal to keep track of the food you eat and how much you exercise you get.  Take over-the-counter and prescription medicines only as told by your health care provider.  Take vitamins and supplements only as told by your health care provider.  Consider joining a support group. Your health care provider may be able to recommend a support group.  Keep all follow-up visits as told by your health care provider. This is important. Contact a health care provider if:  You are unable to meet your weight loss goal after 6 weeks of dietary and lifestyle changes. This information is not intended to replace advice given to you by your health care provider. Make sure you discuss any questions you have with your health care provider. Document Released: 07/13/2004 Document Revised: 11/08/2015 Document Reviewed: 03/24/2015 Elsevier Interactive Patient Education  2018 [employer].

## 2017-09-17 DIAGNOSIS — Z0100 Encounter for examination of eyes and vision without abnormal findings: Secondary | ICD-10-CM | POA: Diagnosis not present

## 2017-10-15 ENCOUNTER — Ambulatory Visit
Admission: RE | Admit: 2017-10-15 | Discharge: 2017-10-15 | Disposition: A | Discharge status: Home / Self Care | Payer: Medicare HMO | Source: Ambulatory Visit | Attending: Family Medicine | Admitting: Family Medicine

## 2017-10-15 DIAGNOSIS — Z1231 Encounter for screening mammogram for malignant neoplasm of breast: Secondary | ICD-10-CM | POA: Insufficient documentation

## 2017-10-15 DIAGNOSIS — Z1239 Encounter for other screening for malignant neoplasm of breast: Secondary | ICD-10-CM

## 2017-10-19 ENCOUNTER — Other Ambulatory Visit: Payer: Self-pay | Admitting: Family Medicine

## 2017-10-25 ENCOUNTER — Other Ambulatory Visit: Payer: Self-pay | Admitting: Family Medicine

## 2017-10-25 MED ORDER — ATENOLOL 50 MG PO TABS: 50.0000 mg | ORAL_TABLET | Freq: Every day | ORAL | 3 refills | Status: DC

## 2017-10-25 NOTE — Telephone Encounter (Signed)
Copied from [place] [id number]. Topic: Quick Communication - Rx Refill/Question >> Oct 25, 2017  9:07 AM Cleaster Corin, NT wrote: Medication:atenolol (TENORMIN) 50 MG tablet [[id number]]  Has the patient contacted their pharmacy? no (Agent: If no, request that the patient contact the pharmacy for the refill.) Preferred Pharmacy (with phone number or street name):[place], [place] [address] Phone: [phone] Fax: [phone]   Agent: Please be advised that RX refills may take up to 3 business days. We ask that you follow-up with your pharmacy.

## 2017-10-25 NOTE — Telephone Encounter (Signed)
Please resend to new pharmacy.

## 2017-11-30 ENCOUNTER — Ambulatory Visit: Payer: Medicare HMO

## 2017-12-03 ENCOUNTER — Other Ambulatory Visit: Payer: Self-pay

## 2017-12-03 DIAGNOSIS — Z86010 Personal history of colonic polyps: Secondary | ICD-10-CM

## 2017-12-03 DIAGNOSIS — Z1211 Encounter for screening for malignant neoplasm of colon: Secondary | ICD-10-CM

## 2017-12-13 ENCOUNTER — Ambulatory Visit (INDEPENDENT_AMBULATORY_CARE_PROVIDER_SITE_OTHER): Payer: Medicare HMO

## 2017-12-13 VITALS — BP 124/70 | HR 77 | Temp 98.5°F | Resp 12 | Ht 70.0 in | Wt 243.2 lb

## 2017-12-13 DIAGNOSIS — Z0000 Encounter for general adult medical examination without abnormal findings: Secondary | ICD-10-CM

## 2017-12-13 NOTE — Patient Instructions (Addendum)
Bethany Bowman , Thank you for taking time to come for your Medicare Wellness Visit. I appreciate your ongoing commitment to your health goals. Please review the following plan we discussed and let me know if I can assist you in the future.   Screening recommendations/referrals: Colorectal Screening: Up to date Mammogram: Up to date Bone Density: Up to date Lung Cancer Screening: You do not qualify for this screening Hepatitis C Screening: Up to date  Vision and Dental Exams: Recommended annual ophthalmology exams for early detection of glaucoma and other disorders of the eye Recommended annual dental exams for proper oral hygiene  Vaccinations: Influenza vaccine: Up to date Pneumococcal vaccine: Up to date Tdap vaccine: Up to date Shingles vaccine: Please call your insurance company to determine your out of pocket expense for the Shingrix vaccine. You may also receive this vaccine at your local pharmacy or Health Dept.   Advanced directives: Advance directive discussed with you today. I have provided a copy for you to complete at home and have notarized. Once this is complete please bring a copy in to our office so we can scan it into your chart.  Goals: Recommend to drink at least 6-8 8oz glasses of water per day.  Next appointment: Please schedule your Annual Wellness Visit with your Nurse Health Advisor in one year.  Preventive Care 69 Years and Older, Female Preventive care refers to lifestyle choices and visits with your health care provider that can promote health and wellness. What does preventive care include?  A yearly physical exam. This is also called an annual well check.  Dental exams once or twice a year.  Routine eye exams. Ask your health care provider how often you should have your eyes checked.  Personal lifestyle choices, including:  Daily care of your teeth and gums.  Regular physical activity.  Eating a healthy diet.  Avoiding tobacco and drug  use.  Limiting alcohol use.  Practicing safe sex.  Taking low-dose aspirin every day.  Taking vitamin and mineral supplements as recommended by your health care provider. What happens during an annual well check? The services and screenings done by your health care provider during your annual well check will depend on your age, overall health, lifestyle risk factors, and family history of disease. Counseling  Your health care provider may ask you questions about your:  Alcohol use.  Tobacco use.  Drug use.  Emotional well-being.  Home and relationship well-being.  Sexual activity.  Eating habits.  History of falls.  Memory and ability to understand (cognition).  Work and work Statistician.  Reproductive health. Screening  You may have the following tests or measurements:  Height, weight, and BMI.  Blood pressure.  Lipid and cholesterol levels. These may be checked every 5 years, or more frequently if you are over 72 years old.  Skin check.  Lung cancer screening. You may have this screening every year starting at age 41 if you have a 30-pack-year history of smoking and currently smoke or have quit within the past 15 years.  Fecal occult blood test (FOBT) of the stool. You may have this test every year starting at age 30.  Flexible sigmoidoscopy or colonoscopy. You may have a sigmoidoscopy every 5 years or a colonoscopy every 10 years starting at age 36.  Hepatitis C blood test.  Hepatitis B blood test.  Sexually transmitted disease (STD) testing.  Diabetes screening. This is done by checking your blood sugar (glucose) after you have not eaten for a  while (fasting). You may have this done every 1-3 years.  Bone density scan. This is done to screen for osteoporosis. You may have this done starting at age 96.  Mammogram. This may be done every 1-2 years. Talk to your health care provider about how often you should have regular mammograms. Talk with your  health care provider about your test results, treatment options, and if necessary, the need for more tests. Vaccines  Your health care provider may recommend certain vaccines, such as:  Influenza vaccine. This is recommended every year.  Tetanus, diphtheria, and acellular pertussis (Tdap, Td) vaccine. You may need a Td booster every 10 years.  Zoster vaccine. You may need this after age 70.  Pneumococcal 13-valent conjugate (PCV13) vaccine. One dose is recommended after age 84.  Pneumococcal polysaccharide (PPSV23) vaccine. One dose is recommended after age 85. Talk to your health care provider about which screenings and vaccines you need and how often you need them. This information is not intended to replace advice given to you by your health care provider. Make sure you discuss any questions you have with your health care provider. Document Released: 07/02/2015 Document Revised: 02/23/2016 Document Reviewed: 04/06/2015 Elsevier Interactive Patient Education  2017 [place] Prevention in the Home Falls can cause injuries. They can happen to people of all ages. There are many things you can do to make your home safe and to help prevent falls. What can I do on the outside of my home?  Regularly fix the edges of walkways and driveways and fix any cracks.  Remove anything that might make you trip as you walk through a door, such as a raised step or threshold.  Trim any bushes or trees on the path to your home.  Use bright outdoor lighting.  Clear any walking paths of anything that might make someone trip, such as rocks or tools.  Regularly check to see if handrails are loose or broken. Make sure that both sides of any steps have handrails.  Any raised decks and porches should have guardrails on the edges.  Have any leaves, snow, or ice cleared regularly.  Use sand or salt on walking paths during winter.  Clean up any spills in your garage right away. This includes oil  or grease spills. What can I do in the bathroom?  Use night lights.  Install grab bars by the toilet and in the tub and shower. Do not use towel bars as grab bars.  Use non-skid mats or decals in the tub or shower.  If you need to sit down in the shower, use a plastic, non-slip stool.  Keep the floor dry. Clean up any water that spills on the floor as soon as it happens.  Remove soap buildup in the tub or shower regularly.  Attach bath mats securely with double-sided non-slip rug tape.  Do not have throw rugs and other things on the floor that can make you trip. What can I do in the bedroom?  Use night lights.  Make sure that you have a light by your bed that is easy to reach.  Do not use any sheets or blankets that are too big for your bed. They should not hang down onto the floor.  Have a firm chair that has side arms. You can use this for support while you get dressed.  Do not have throw rugs and other things on the floor that can make you trip. What can I do in the  kitchen?  Clean up any spills right away.  Avoid walking on wet floors.  Keep items that you use a lot in easy-to-reach places.  If you need to reach something above you, use a strong step stool that has a grab bar.  Keep electrical cords out of the way.  Do not use floor polish or wax that makes floors slippery. If you must use wax, use non-skid floor wax.  Do not have throw rugs and other things on the floor that can make you trip. What can I do with my stairs?  Do not leave any items on the stairs.  Make sure that there are handrails on both sides of the stairs and use them. Fix handrails that are broken or loose. Make sure that handrails are as long as the stairways.  Check any carpeting to make sure that it is firmly attached to the stairs. Fix any carpet that is loose or worn.  Avoid having throw rugs at the top or bottom of the stairs. If you do have throw rugs, attach them to the floor with  carpet tape.  Make sure that you have a light switch at the top of the stairs and the bottom of the stairs. If you do not have them, ask someone to add them for you. What else can I do to help prevent falls?  Wear shoes that:  Do not have high heels.  Have rubber bottoms.  Are comfortable and fit you well.  Are closed at the toe. Do not wear sandals.  If you use a stepladder:  Make sure that it is fully opened. Do not climb a closed stepladder.  Make sure that both sides of the stepladder are locked into place.  Ask someone to hold it for you, if possible.  Clearly mark and make sure that you can see:  Any grab bars or handrails.  First and last steps.  Where the edge of each step is.  Use tools that help you move around (mobility aids) if they are needed. These include:  Canes.  Walkers.  Scooters.  Crutches.  Turn on the lights when you go into a dark area. Replace any light bulbs as soon as they burn out.  Set up your furniture so you have a clear path. Avoid moving your furniture around.  If any of your floors are uneven, fix them.  If there are any pets around you, be aware of where they are.  Review your medicines with your doctor. Some medicines can make you feel dizzy. This can increase your chance of falling. Ask your doctor what other things that you can do to help prevent falls. This information is not intended to replace advice given to you by your health care provider. Make sure you discuss any questions you have with your health care provider. Document Released: 04/01/2009 Document Revised: 11/11/2015 Document Reviewed: 07/10/2014 Elsevier Interactive Patient Education  2017 [employer].

## 2017-12-13 NOTE — Progress Notes (Signed)
Subjective:   Bethany Bowman is a 67 y.o. female who presents for an Initial Medicare Annual Wellness Visit.  Review of Systems    N/A  Cardiac Risk Factors include: advanced age (>71men, >75 women);dyslipidemia;hypertension;obesity (BMI >30kg/m2)     Objective:    Today's Vitals   12/13/17 1356  BP: 124/70  Pulse: 77  Resp: 12  Temp: 98.5 F (36.9 C)  TempSrc: Oral  SpO2: 98%  Weight: 243 lb 3.2 oz (110.3 kg)  Height: 5\' 10"  (1.778 m)   Body mass index is 34.9 kg/m.  Advanced Directives 12/13/2017 03/15/2017 12/28/2016 08/24/2016 02/18/2016 03/02/2015 11/25/2014  Does Patient Have a Medical Advance Directive? No No No No No No No  Would patient like information on creating a medical advance directive? Yes (MAU/Ambulatory/Procedural Areas - Information given) - No - Patient declined - No - patient declined information No - patient declined information No - patient declined information    Current Medications (verified) Outpatient Encounter Medications as of 12/13/2017  Medication Sig  . aspirin EC 81 MG tablet Take 1 tablet (81 mg total) by mouth daily.  Marland Kitchen atenolol (TENORMIN) 50 MG tablet Take 1 tablet (50 mg total) by mouth daily.  . cholecalciferol (VITAMIN D) 1000 units tablet Take 1,000 Units by mouth daily.  . fluticasone (FLONASE) 50 MCG/ACT nasal spray Place 2 sprays into both nostrils daily.  Marland Kitchen lisinopril-hydrochlorothiazide (PRINZIDE,ZESTORETIC) 20-25 MG tablet Take 1 tablet by mouth daily.  . potassium chloride (K-DUR,KLOR-CON) 10 MEQ tablet Take 1 tablet (10 mEq total) by mouth daily.  . pravastatin (PRAVACHOL) 40 MG tablet Take 0.5 tablets (20 mg total) by mouth at bedtime.   No facility-administered encounter medications on file as of 12/13/2017.     Allergies (verified) Patient has no known allergies.   History: Past Medical History:  Diagnosis Date  . Allergy   . GERD (gastroesophageal reflux disease)   . Hyperlipidemia   . Hypertension    Past Surgical  History:  Procedure Laterality Date  . COLONOSCOPY WITH PROPOFOL N/A 12/28/2016   Procedure: COLONOSCOPY WITH PROPOFOL;  Surgeon: Jonathon Bellows, MD;  Location[facility] ENDOSCOPY;  Service: Endoscopy;  Laterality: N/A;   Family History  Problem Relation Age of Onset  . Colon cancer Mother   . Hypertension Mother   . Alcohol abuse Father   . Hypertension Father   . Hypertension Sister   . Hypertension Brother   . Prostate cancer Brother   . Bone cancer Brother   . Lung cancer Brother   . Hypertension Brother   . Hypertension Brother   . Breast cancer Neg Hx    Social History   Socioeconomic History  . Marital status: Married    Spouse name: Lavera Guise  . Number of children: 2  . Years of education: some college  . Highest education level: 12th grade  Occupational History  . Not on file  Social Needs  . Financial resource strain: Not hard at all  . Food insecurity:    Worry: Never true    Inability: Never true  . Transportation needs:    Medical: No    Non-medical: No  Tobacco Use  . Smoking status: Former Smoker    Packs/day: 0.50    Years: 12.00    Pack years: 6.00    Types: Cigarettes    Last attempt to quit: 08/03/1981    Years since quitting: 36.3  . Smokeless tobacco: Never Used  . Tobacco comment: smoking cessation materials not required  Substance  and Sexual Activity  . Alcohol use: No    Alcohol/week: 0.0 oz  . Drug use: No  . Sexual activity: Not Currently  Lifestyle  . Physical activity:    Days per week: 0 days    Minutes per session: 0 min  . Stress: Not at all  Relationships  . Social connections:    Talks on phone: Patient refused    Gets together: Patient refused    Attends religious service: Patient refused    Active member of club or organization: Patient refused    Attends meetings of clubs or organizations: Patient refused    Relationship status: Married  Other Topics Concern  . Not on file  Social History Narrative  . Not on file     Tobacco Counseling Counseling given: No Comment: smoking cessation materials not required  Clinical Intake:  Pre-visit preparation completed: Yes  Pain : No/denies pain   BMI - recorded: 34.9 Nutritional Status: BMI > 30  Obese Nutritional Risks: None Diabetes: No  How often do you need to have someone help you when you read instructions, pamphlets, or other written materials from your doctor or pharmacy?: 1 - Never  Interpreter Needed?: No  Information entered by :: AEversole, LPN   Activities of Daily Living In your present state of health, do you have any difficulty performing the following activities: 12/13/2017 09/12/2017  Hearing? N N  Comment denies hearing aids -  Vision? N N  Comment wears eyeglasses -  Difficulty concentrating or making decisions? N N  Walking or climbing stairs? Y N  Comment joint pain -  Dressing or bathing? N N  Doing errands, shopping? N N  Preparing Food and eating ? N -  Comment full set upper and lower dentures -  Using the Toilet? N -  In the past six months, have you accidently leaked urine? N -  Do you have problems with loss of bowel control? N -  Managing your Medications? N -  Managing your Finances? N -  Housekeeping or managing your Housekeeping? N -  Some recent data might be hidden     Immunizations and Health Maintenance Immunization History  Administered Date(s) Administered  . Influenza Split 01/11/2010  . Influenza, High Dose Seasonal PF 02/24/2017  . Influenza, Seasonal, Injecte, Preservative Fre 12/19/2010, 01/28/2014  . Influenza-Unspecified 02/15/2015  . Pneumococcal Conjugate-13 04/03/2014  . Pneumococcal Polysaccharide-23 04/02/2013  . Tdap 08/26/2012  . Zoster 02/12/2012   There are no preventive care reminders to display for this patient.  Patient Care Team: Lada, Satira Anis, MD as PCP - General (Family Medicine)  Indicate any recent Medical Services you may have received from other than [facility]  providers in the past year (date may be approximate).     Assessment:   This is a routine wellness examination for [place].  Hearing/Vision screen Vision Screening Comments: Sees [facility] for annual eye exams  Dietary issues and exercise activities discussed: Current Exercise Habits: The patient does not participate in regular exercise at present, Exercise limited by: None identified  Goals    . DIET - INCREASE WATER INTAKE     Recommend to drink at least 6-8 8oz glasses of water per day.      Depression Screen PHQ 2/9 Scores 12/13/2017 09/12/2017 03/15/2017 08/24/2016 02/18/2016 03/02/2015 11/25/2014  PHQ - 2 Score 0 0 0 0 0 0 0  PHQ- 9 Score 0 - - - - - -    Fall Risk Fall Risk  12/13/2017  09/12/2017 03/15/2017 08/24/2016 02/18/2016  Falls in the past year? No No No No No  Risk for fall due to : Impaired vision - - - -  Risk for fall due to: Comment wears eyeglasses - - - -    FALL RISK PREVENTION PERTAINING TO HOME: Is your home free of loose throw rugs in walkways, pet beds, electrical cords, etc? Yes Is there adequate lighting in your home to reduce risk of falls?  Yes Are there stairs in or around your home WITH handrails? Yes  ASSISTIVE DEVICES UTILIZED TO PREVENT FALLS: Use of a cane, walker or w/c? No Grab bars in the bathroom? Yes  Shower chair or a place to sit while bathing? No An elevated toilet seat or a handicapped toilet? Yes  Timed Get Up and Go Performed: Yes. Pt ambulated 10 feet within 14 sec. Gait slow, steady and without the use of an assistive device. No intervention required at this time. Fall risk prevention has been discussed.  Community Resource Referral:  Pt declined my offer to send [employer] Referral to Care Guide for a shower chair.  Cognitive Function:     6CIT Screen 12/13/2017  What Year? 0 points  What month? 0 points  What time? 0 points  Count back from 20 0 points  Months in reverse 0 points  Repeat phrase 0 points  Total Score  0    Screening Tests Health Maintenance  Topic Date Due  . COLONOSCOPY  12/28/2017  . INFLUENZA VACCINE  01/17/2018  . PNA vac Low Risk Adult (2 of 2 - PPSV23) 04/02/2018  . MAMMOGRAM  10/16/2018  . TETANUS/TDAP  08/27/2022  . DEXA SCAN  Completed  . Hepatitis C Screening  Completed    Qualifies for Shingles Vaccine? Yes. Zostavax completed 02/12/12. Due for Shingrix. Education has been provided regarding the importance of this vaccine. Pt has been advised to call her insurance company to determine her out of pocket expense. Advised she may also receive this vaccine at her local pharmacy or Health Dept. Verbalized acceptance and understanding.  Cancer Screenings: Lung: Low Dose CT Chest recommended if Age 28-80 years, 30 pack-year currently smoking OR have quit w/in 15years. Patient does not qualify. Breast: Up to date on Mammogram? Yes. Completed 10/15/17. Repeat every year   Up to date of Bone Density/Dexa? Yes. Completed 10/09/16. Repeat every 2 years. Colorectal: Completed 12/28/16. Repeat every year. Pt states she is scheduled for repeat colonoscopy 01/15/18  Additional Screenings: Hepatitis C Screening: Completed 08/24/16   Plan:  I have personally reviewed and addressed the Medicare Annual Wellness questionnaire and have noted the following in the patient's chart:  A. Medical and social history B. Use of alcohol, tobacco or illicit drugs  C. Current medications and supplements D. Functional ability and status E.  Nutritional status F.  Physical activity G. Advance directives H. List of other physicians I.  Hospitalizations, surgeries, and ER visits in previous 12 months J.  [place] such as hearing and vision if needed, cognitive and depression L. Referrals and appointments  In addition, I have reviewed and discussed with patient certain preventive protocols, quality metrics, and best practice recommendations. A written personalized care plan for preventive  services as well as general preventive health recommendations were provided to patient.  See attached scanned questionnaire for additional information.   Signed,  Aleatha Borer, LPN Nurse Health Advisor

## 2017-12-14 ENCOUNTER — Telehealth: Payer: Self-pay | Admitting: Family Medicine

## 2017-12-14 MED ORDER — POTASSIUM CHLORIDE CRYS ER 10 MEQ PO TBCR: 10.0000 meq | EXTENDED_RELEASE_TABLET | Freq: Every day | ORAL | 0 refills | Status: DC

## 2017-12-14 MED ORDER — PRAVASTATIN SODIUM 40 MG PO TABS: 20.0000 mg | ORAL_TABLET | Freq: Every day | ORAL | 0 refills | Status: DC

## 2017-12-14 NOTE — Telephone Encounter (Signed)
-----   Message from Aleatha Borer, LPN sent at 1/59/5396  2:37 PM EDT ----- Pt seen today for her AWV. Appears she is scheduled for a follow up with you 03/15/18. She has requested a refill of her K+ and Pravastatin. Medications reconciled and pharmacy confirmed.

## 2018-01-15 ENCOUNTER — Other Ambulatory Visit: Payer: Self-pay

## 2018-01-15 ENCOUNTER — Encounter
Admission: RE | Disposition: A | Discharge status: Home / Self Care | Payer: Self-pay | Source: Ambulatory Visit | Attending: Gastroenterology

## 2018-01-15 ENCOUNTER — Ambulatory Visit: Payer: Medicare HMO | Admitting: Anesthesiology

## 2018-01-15 ENCOUNTER — Encounter: Payer: Self-pay | Admitting: *Deleted

## 2018-01-15 ENCOUNTER — Ambulatory Visit
Admission: RE | Admit: 2018-01-15 | Discharge: 2018-01-15 | Disposition: A | Discharge status: Home / Self Care | Payer: Medicare HMO | Source: Ambulatory Visit | Attending: Gastroenterology | Admitting: Gastroenterology

## 2018-01-15 DIAGNOSIS — F419 Anxiety disorder, unspecified: Secondary | ICD-10-CM | POA: Diagnosis not present

## 2018-01-15 DIAGNOSIS — D125 Benign neoplasm of sigmoid colon: Secondary | ICD-10-CM | POA: Diagnosis not present

## 2018-01-15 DIAGNOSIS — Z79899 Other long term (current) drug therapy: Secondary | ICD-10-CM | POA: Diagnosis not present

## 2018-01-15 DIAGNOSIS — Z86010 Personal history of colonic polyps: Secondary | ICD-10-CM | POA: Diagnosis not present

## 2018-01-15 DIAGNOSIS — D120 Benign neoplasm of cecum: Secondary | ICD-10-CM | POA: Diagnosis not present

## 2018-01-15 DIAGNOSIS — I10 Essential (primary) hypertension: Secondary | ICD-10-CM | POA: Diagnosis not present

## 2018-01-15 DIAGNOSIS — Z6834 Body mass index (BMI) 34.0-34.9, adult: Secondary | ICD-10-CM | POA: Diagnosis not present

## 2018-01-15 DIAGNOSIS — Z800 Family history of malignant neoplasm of digestive organs: Secondary | ICD-10-CM | POA: Insufficient documentation

## 2018-01-15 DIAGNOSIS — E669 Obesity, unspecified: Secondary | ICD-10-CM | POA: Diagnosis not present

## 2018-01-15 DIAGNOSIS — K219 Gastro-esophageal reflux disease without esophagitis: Secondary | ICD-10-CM | POA: Insufficient documentation

## 2018-01-15 DIAGNOSIS — Z87891 Personal history of nicotine dependence: Secondary | ICD-10-CM | POA: Diagnosis not present

## 2018-01-15 DIAGNOSIS — Z7982 Long term (current) use of aspirin: Secondary | ICD-10-CM | POA: Diagnosis not present

## 2018-01-15 DIAGNOSIS — D122 Benign neoplasm of ascending colon: Secondary | ICD-10-CM | POA: Diagnosis not present

## 2018-01-15 DIAGNOSIS — D123 Benign neoplasm of transverse colon: Secondary | ICD-10-CM | POA: Diagnosis not present

## 2018-01-15 DIAGNOSIS — K635 Polyp of colon: Secondary | ICD-10-CM | POA: Insufficient documentation

## 2018-01-15 DIAGNOSIS — Z1211 Encounter for screening for malignant neoplasm of colon: Secondary | ICD-10-CM | POA: Insufficient documentation

## 2018-01-15 DIAGNOSIS — E785 Hyperlipidemia, unspecified: Secondary | ICD-10-CM | POA: Diagnosis not present

## 2018-01-15 DIAGNOSIS — D126 Benign neoplasm of colon, unspecified: Secondary | ICD-10-CM | POA: Diagnosis not present

## 2018-01-15 HISTORY — PX: COLONOSCOPY WITH PROPOFOL: SHX5780

## 2018-01-15 SURGERY — COLONOSCOPY WITH PROPOFOL
Anesthesia: General

## 2018-01-15 MED ORDER — PROPOFOL 500 MG/50ML IV EMUL
INTRAVENOUS | Status: AC
  Filled 2018-01-15: qty 50

## 2018-01-15 MED ORDER — PROPOFOL 10 MG/ML IV BOLUS
INTRAVENOUS | Status: AC
  Filled 2018-01-15: qty 20

## 2018-01-15 MED ORDER — FENTANYL CITRATE (PF) 100 MCG/2ML IJ SOLN
INTRAMUSCULAR | Status: DC | PRN
  Administered 2018-01-15 (×2): 50 ug via INTRAVENOUS

## 2018-01-15 MED ORDER — PROPOFOL 500 MG/50ML IV EMUL
INTRAVENOUS | Status: DC | PRN
  Administered 2018-01-15: 160 ug/kg/min via INTRAVENOUS

## 2018-01-15 MED ORDER — SODIUM CHLORIDE 0.9 % IV SOLN
INTRAVENOUS | Status: DC | PRN
  Administered 2018-01-15: 1000 mL
  Administered 2018-01-15: 07:00:00 via INTRAVENOUS

## 2018-01-15 MED ORDER — PROPOFOL 10 MG/ML IV BOLUS
INTRAVENOUS | Status: DC | PRN
  Administered 2018-01-15: 100 mg via INTRAVENOUS

## 2018-01-15 MED ORDER — PHENYLEPHRINE HCL 10 MG/ML IJ SOLN
INTRAMUSCULAR | Status: AC
Start: 2018-01-15 — End: ?
  Filled 2018-01-15: qty 1

## 2018-01-15 MED ORDER — LIDOCAINE HCL (PF) 2 % IJ SOLN
INTRAMUSCULAR | Status: AC
  Filled 2018-01-15: qty 10

## 2018-01-15 MED ORDER — LIDOCAINE 2% (20 MG/ML) 5 ML SYRINGE
INTRAMUSCULAR | Status: DC | PRN
  Administered 2018-01-15: 40 mg via INTRAVENOUS

## 2018-01-15 MED ORDER — FENTANYL CITRATE (PF) 100 MCG/2ML IJ SOLN
INTRAMUSCULAR | Status: AC
Start: 2018-01-15 — End: ?
  Filled 2018-01-15: qty 2

## 2018-01-15 MED ORDER — PHENYLEPHRINE HCL 10 MG/ML IJ SOLN
INTRAMUSCULAR | Status: DC | PRN
  Administered 2018-01-15 (×2): 100 ug via INTRAVENOUS

## 2018-01-15 MED ORDER — SODIUM CHLORIDE 0.9 % IJ SOLN
INTRAMUSCULAR | Status: AC
  Filled 2018-01-15: qty 10

## 2018-01-15 NOTE — Transfer of Care (Signed)
Immediate Anesthesia Transfer of Care Note  Patient: Bethany Bowman  Procedure(s) Performed: COLONOSCOPY WITH PROPOFOL (N/A )  Patient Location: PACU and Endoscopy Unit  Anesthesia Type:General  Level of Consciousness: sedated  Airway & Oxygen Therapy: Patient Spontanous Breathing and Patient connected to nasal cannula oxygen  Post-op Assessment: Report given to RN and Post -op Vital signs reviewed and stable  Post vital signs: Reviewed and stable  Last Vitals:  Vitals Value Taken Time  BP    Temp    Pulse    Resp    SpO2      Last Pain:  Vitals:   01/15/18 8628  TempSrc: Tympanic  PainSc: 0-No pain         Complications: No apparent anesthesia complications

## 2018-01-15 NOTE — Anesthesia Postprocedure Evaluation (Signed)
Anesthesia Post Note  Patient: Bethany Bowman  Procedure(s) Performed: COLONOSCOPY WITH PROPOFOL (N/A )  Patient location during evaluation: PACU Anesthesia Type: General Level of consciousness: awake and alert and oriented Pain management: pain level controlled Vital Signs Assessment: post-procedure vital signs reviewed and stable Respiratory status: spontaneous breathing Cardiovascular status: blood pressure returned to baseline Anesthetic complications: no     Last Vitals:  Vitals:   01/15/18 0902 01/15/18 0911  BP: 123/81 140/84  Pulse: (!) 53 (!) 51  Resp: 15 18  Temp:    SpO2: 99% 100%    Last Pain:  Vitals:   01/15/18 0902  TempSrc:   PainSc: 0-No pain                 Micharl Helmes

## 2018-01-15 NOTE — Op Note (Signed)
[facility] Gastroenterology Patient Name: Bethany Bowman Procedure Date: 01/15/2018 7:13 AM MRN: [id number] Account #: [id number] Date of Birth: Oct 27, 1950 Admit Type: Outpatient Age: 67 Room: [facility] ENDO ROOM 1 Gender: Female Note Status: Finalized Procedure:            Colonoscopy Indications:          High risk colon cancer surveillance: Personal history                        of colonic polyps Providers:            Jonathon Bellows MD, MD Referring MD:         Arnetha Courser (Referring MD) Medicines:            Monitored Anesthesia Care Complications:        No immediate complications. Procedure:            Pre-Anesthesia Assessment:                       - Prior to the procedure, a History and Physical was                        performed, and patient medications, allergies and                        sensitivities were reviewed. The patient's tolerance of                        previous anesthesia was reviewed.                       - The risks and benefits of the procedure and the                        sedation options and risks were discussed with the                        patient. All questions were answered and informed                        consent was obtained.                       - ASA Grade Assessment: II - A patient with mild                        systemic disease.                       After obtaining informed consent, the colonoscope was                        passed under direct vision. Throughout the procedure,                        the patient's blood pressure, pulse, and oxygen                        saturations were monitored continuously. The                        Colonoscope  was introduced through the anus and                        advanced to the the cecum, identified by the                        appendiceal orifice, IC valve and transillumination.                        The colonoscopy was performed with ease. The patient           tolerated the procedure well. The quality of the bowel                        preparation was good. Findings:      The perianal and digital rectal examinations were normal.      Multiple sessile polyps were found in the sigmoid colon. The polyps were       4 to 6 mm in size. These polyps were removed with a cold snare.       Resection and retrieval were complete.      Two sessile polyps were found in the transverse colon. The polyps were 4       to 5 mm in size. These polyps were removed with a cold snare. Resection       and retrieval were complete.      Four sessile polyps were found in the ascending colon. The polyps were 3       to 4 mm in size. These polyps were removed with a cold biopsy forceps.       Resection and retrieval were complete.      Two sessile polyps were found in the cecum. The polyps were 3 to 4 mm in       size. These polyps were removed with a cold biopsy forceps. Resection       and retrieval were complete.      The exam was otherwise without abnormality. Impression:           - Multiple 4 to 6 mm polyps in the sigmoid colon,                        removed with a cold snare. Resected and retrieved.                       - Two 4 to 5 mm polyps in the transverse colon, removed                        with a cold snare. Resected and retrieved.                       - Four 3 to 4 mm polyps in the ascending colon, removed                        with a cold biopsy forceps. Resected and retrieved.                       - Two 3 to 4 mm polyps in the cecum, removed with a  cold biopsy forceps. Resected and retrieved.                       - The examination was otherwise normal. Recommendation:       - Discharge patient to home (with escort).                       - Resume previous diet.                       - Continue present medications.                       - Await pathology results.                       - Repeat colonoscopy in 3 years  for surveillance. Procedure Code(s):    --- Professional ---                       [phone], Colonoscopy, flexible; with removal of tumor(s),                        polyp(s), or other lesion(s) by snare technique                       [id number], 37, Colonoscopy, flexible; with biopsy, single                        or multiple Diagnosis Code(s):    --- Professional ---                       Z86.010, Personal history of colonic polyps                       D12.5, Benign neoplasm of sigmoid colon                       D12.3, Benign neoplasm of transverse colon (hepatic                        flexure or splenic flexure)                       D12.2, Benign neoplasm of ascending colon                       D12.0, Benign neoplasm of cecum CPT copyright 2017 American Medical Association. All rights reserved. The codes documented in this report are preliminary and upon coder review may  be revised to meet current compliance requirements. Jonathon Bellows, MD Jonathon Bellows MD, MD 01/15/2018 8:36:32 AM This report has been signed electronically. Number of Addenda: 0 Note Initiated On: 01/15/2018 7:13 AM Scope Withdrawal Time: 0 hours 24 minutes 10 seconds  Total Procedure Duration: 0 hours 26 minutes 34 seconds       [facility]

## 2018-01-15 NOTE — Anesthesia Post-op Follow-up Note (Signed)
Anesthesia QCDR form completed.        

## 2018-01-15 NOTE — H&P (Signed)
Jonathon Bellows, MD [address], [place], [place], [place], [address] [address], [place], [place], [place], [address] Phone: [phone]  Fax: [phone]  Primary Care Physician:  Arnetha Courser, MD   Pre-Procedure History & Physical: HPI:  Bethany Bowman is a 67 y.o. female is here for an colonoscopy.   Past Medical History:  Diagnosis Date  . Allergy   . GERD (gastroesophageal reflux disease)   . Hyperlipidemia   . Hypertension     Past Surgical History:  Procedure Laterality Date  . COLONOSCOPY WITH PROPOFOL N/A 12/28/2016   Procedure: COLONOSCOPY WITH PROPOFOL;  Surgeon: Jonathon Bellows, MD;  Location: [facility] ENDOSCOPY;  Service: Endoscopy;  Laterality: N/A;    Prior to Admission medications   Medication Sig Start Date End Date Taking? Authorizing Provider  aspirin EC 81 MG tablet Take 1 tablet (81 mg total) by mouth daily. 03/15/17  Yes Lada, Satira Anis, MD  atenolol (TENORMIN) 50 MG tablet Take 1 tablet (50 mg total) by mouth daily. 10/25/17  Yes Lada, Satira Anis, MD  cholecalciferol (VITAMIN D) 1000 units tablet Take 1,000 Units by mouth daily.   Yes [provider]  fluticasone (FLONASE) 50 MCG/ACT nasal spray Place 2 sprays into both nostrils daily. 03/15/17  Yes Lada, Satira Anis, MD  lisinopril-hydrochlorothiazide (PRINZIDE,ZESTORETIC) 20-25 MG tablet Take 1 tablet by mouth daily. 03/15/17  Yes Lada, Satira Anis, MD  potassium chloride (K-DUR,KLOR-CON) 10 MEQ tablet Take 1 tablet (10 mEq total) by mouth daily. 12/14/17  Yes Lada, Satira Anis, MD  pravastatin (PRAVACHOL) 40 MG tablet Take 0.5 tablets (20 mg total) by mouth at bedtime. 12/14/17  Yes Arnetha Courser, MD    Allergies as of 12/03/2017  . (No Known Allergies)    Family History  Problem Relation Age of Onset  . Colon cancer Mother   . Hypertension Mother   . Alcohol abuse Father   . Hypertension Father   . Hypertension Sister   . Hypertension Brother   . Prostate cancer Brother   . Bone cancer Brother    . Lung cancer Brother   . Hypertension Brother   . Hypertension Brother   . Breast cancer Neg Hx     Social History   Socioeconomic History  . Marital status: Married    Spouse name: Lavera Guise  . Number of children: 2  . Years of education: some college  . Highest education level: 12th grade  Occupational History  . Not on file  Social Needs  . Financial resource strain: Not hard at all  . Food insecurity:    Worry: Never true    Inability: Never true  . Transportation needs:    Medical: No    Non-medical: No  Tobacco Use  . Smoking status: Former Smoker    Packs/day: 0.50    Years: 12.00    Pack years: 6.00    Types: Cigarettes    Last attempt to quit: 08/03/1981    Years since quitting: 36.4  . Smokeless tobacco: Never Used  . Tobacco comment: smoking cessation materials not required  Substance and Sexual Activity  . Alcohol use: No    Alcohol/week: 0.0 oz  . Drug use: No  . Sexual activity: Not Currently  Lifestyle  . Physical activity:    Days per week: 0 days    Minutes per session: 0 min  . Stress: Not at all  Relationships  . Social connections:    Talks on phone: Patient refused  Gets together: Patient refused    Attends religious service: Patient refused    Active member of club or organization: Patient refused    Attends meetings of clubs or organizations: Patient refused    Relationship status: Married  . Intimate partner violence:    Fear of current or ex partner: No    Emotionally abused: No    Physically abused: No    Forced sexual activity: No  Other Topics Concern  . Not on file  Social History Narrative  . Not on file    Review of Systems: See HPI, otherwise negative ROS  Physical Exam: BP (!) 145/91   Pulse 60   Temp 97.9 F (36.6 C) (Tympanic)   Resp 16   Ht 5\' 10"  (1.778 m)   Wt 243 lb (110.2 kg)   LMP  (LMP Unknown)   BMI 34.87 kg/m  General:   Alert,  pleasant and cooperative in NAD Head:  Normocephalic and  atraumatic. Neck:  Supple; no masses or thyromegaly. Lungs:  Clear throughout to auscultation, normal respiratory effort.    Heart:  +S1, +S2, Regular rate and rhythm, No edema. Abdomen:  Soft, nontender and nondistended. Normal bowel sounds, without guarding, and without rebound.   Neurologic:  Alert and  oriented x4;  grossly normal neurologically.  Impression/Plan: Bethany Bowman is here for an colonoscopy to be performed for surveillance due to prior history of colon polyps   Risks, benefits, limitations, and alternatives regarding  colonoscopy have been reviewed with the patient.  Questions have been answered.  All parties agreeable.   Jonathon Bellows, MD  01/15/2018, 7:58 AM

## 2018-01-15 NOTE — Anesthesia Preprocedure Evaluation (Signed)
Anesthesia Evaluation  Patient identified by MRN, date of birth, ID band Patient awake    Reviewed: Allergy & Precautions, NPO status , Patient's Chart, lab work & pertinent test results  History of Anesthesia Complications Negative for: history of anesthetic complications  Airway Mallampati: II  TM Distance: >3 FB Neck ROM: Full    Dental  (+) Upper Dentures, Lower Dentures   Pulmonary neg sleep apnea, neg COPD, former smoker,    breath sounds clear to auscultation- rhonchi (-) wheezing      Cardiovascular hypertension, Pt. on medications (-) CAD, (-) Past MI and (-) Cardiac Stents  Rhythm:Regular Rate:Normal - Systolic murmurs and - Diastolic murmurs    Neuro/Psych Anxiety negative neurological ROS     GI/Hepatic Neg liver ROS, GERD  ,  Endo/Other  negative endocrine ROSneg diabetes  Renal/GU negative Renal ROS     Musculoskeletal negative musculoskeletal ROS (+)   Abdominal (+) + obese,   Peds  Hematology negative hematology ROS (+)   Anesthesia Other Findings Past Medical History: No date: Allergy No date: GERD (gastroesophageal reflux disease) No date: Hyperlipidemia No date: Hypertension   Reproductive/Obstetrics                             Anesthesia Physical  Anesthesia Plan  ASA: II  Anesthesia Plan: General   Post-op Pain Management:    Induction: Intravenous  PONV Risk Score and Plan: 2 and Propofol and TIVA  Airway Management Planned: Natural Airway and Nasal Cannula  Additional Equipment:   Intra-op Plan:   Post-operative Plan:   Informed Consent: I have reviewed the patients History and Physical, chart, labs and discussed the procedure including the risks, benefits and alternatives for the proposed anesthesia with the patient or authorized representative who has indicated his/her understanding and acceptance.   Dental advisory given  Plan Discussed  with: CRNA and Anesthesiologist  Anesthesia Plan Comments:         Anesthesia Quick Evaluation

## 2018-01-19 LAB — SURGICAL PATHOLOGY

## 2018-01-28 ENCOUNTER — Encounter: Payer: Self-pay | Admitting: Gastroenterology

## 2018-02-19 ENCOUNTER — Other Ambulatory Visit: Payer: Self-pay | Admitting: Family Medicine

## 2018-02-19 NOTE — Telephone Encounter (Signed)
Last 3/27 Next 9/27

## 2018-03-15 ENCOUNTER — Ambulatory Visit (INDEPENDENT_AMBULATORY_CARE_PROVIDER_SITE_OTHER): Payer: Medicare HMO | Admitting: Nurse Practitioner

## 2018-03-15 ENCOUNTER — Encounter: Payer: Self-pay | Admitting: Family Medicine

## 2018-03-15 ENCOUNTER — Other Ambulatory Visit: Payer: Self-pay | Admitting: Nurse Practitioner

## 2018-03-15 VITALS — BP 130/76 | HR 72 | Temp 100.0°F | Ht 70.0 in | Wt 246.2 lb

## 2018-03-15 DIAGNOSIS — Z5181 Encounter for therapeutic drug level monitoring: Secondary | ICD-10-CM

## 2018-03-15 DIAGNOSIS — E876 Hypokalemia: Secondary | ICD-10-CM

## 2018-03-15 DIAGNOSIS — R05 Cough: Secondary | ICD-10-CM

## 2018-03-15 DIAGNOSIS — T502X5A Adverse effect of carbonic-anhydrase inhibitors, benzothiadiazides and other diuretics, initial encounter: Secondary | ICD-10-CM

## 2018-03-15 DIAGNOSIS — R509 Fever, unspecified: Secondary | ICD-10-CM | POA: Diagnosis not present

## 2018-03-15 DIAGNOSIS — J302 Other seasonal allergic rhinitis: Secondary | ICD-10-CM

## 2018-03-15 DIAGNOSIS — I10 Essential (primary) hypertension: Secondary | ICD-10-CM | POA: Diagnosis not present

## 2018-03-15 DIAGNOSIS — E669 Obesity, unspecified: Secondary | ICD-10-CM | POA: Diagnosis not present

## 2018-03-15 DIAGNOSIS — E785 Hyperlipidemia, unspecified: Secondary | ICD-10-CM | POA: Diagnosis not present

## 2018-03-15 DIAGNOSIS — J0140 Acute pansinusitis, unspecified: Secondary | ICD-10-CM

## 2018-03-15 DIAGNOSIS — R059 Cough, unspecified: Secondary | ICD-10-CM

## 2018-03-15 LAB — COMPLETE METABOLIC PANEL WITH GFR
AG RATIO: 1.5 (calc) (ref 1.0–2.5)
ALT: 18 U/L (ref 6–29)
AST: 19 U/L (ref 10–35)
Albumin: 4.3 g/dL (ref 3.6–5.1)
Alkaline phosphatase (APISO): 49 U/L (ref 33–130)
BUN: 15 mg/dL (ref 7–25)
CALCIUM: 9.7 mg/dL (ref 8.6–10.4)
CHLORIDE: 100 mmol/L (ref 98–110)
CO2: 35 mmol/L — AB (ref 20–32)
Creat: 0.86 mg/dL (ref 0.50–0.99)
GFR, EST NON AFRICAN AMERICAN: 69 mL/min/{1.73_m2} (ref >=60)
GFR, Est African American: 80 mL/min/{1.73_m2} (ref >=60)
Globulin: 2.8 g/dL (calc) (ref 1.9–3.7)
Glucose, Bld: 95 mg/dL (ref 65–99)
Potassium: 3.7 mmol/L (ref 3.5–5.3)
Sodium: 140 mmol/L (ref 135–146)
Total Bilirubin: 0.4 mg/dL (ref 0.2–1.2)
Total Protein: 7.1 g/dL (ref 6.1–8.1)

## 2018-03-15 LAB — LIPID PANEL
Cholesterol: 185 mg/dL (ref <200)
HDL: 45 mg/dL — AB (ref >50)
LDL Cholesterol (Calc): 117 mg/dL (calc) — ABNORMAL HIGH
NON-HDL CHOLESTEROL (CALC): 140 mg/dL — AB (ref <130)
TRIGLYCERIDES: 120 mg/dL (ref <150)
Total CHOL/HDL Ratio: 4.1 (calc) (ref <5.0)

## 2018-03-15 LAB — POCT INFLUENZA A/B
INFLUENZA A, POC: NEGATIVE
INFLUENZA B, POC: NEGATIVE

## 2018-03-15 MED ORDER — FLUTICASONE PROPIONATE 50 MCG/ACT NA SUSP: 2.0000 | Freq: Every day | NASAL | 6 refills | Status: DC

## 2018-03-15 MED ORDER — ATENOLOL 50 MG PO TABS: 50.0000 mg | ORAL_TABLET | Freq: Every day | ORAL | 1 refills | Status: DC

## 2018-03-15 MED ORDER — AMOXICILLIN-POT CLAVULANATE 875-125 MG PO TABS: 1.0000 | ORAL_TABLET | Freq: Two times a day (BID) | ORAL | 0 refills | Status: DC

## 2018-03-15 MED ORDER — ATORVASTATIN CALCIUM 40 MG PO TABS: 40.0000 mg | ORAL_TABLET | Freq: Every day | ORAL | 1 refills | Status: DC

## 2018-03-15 MED ORDER — PROMETHAZINE-DM 6.25-15 MG/5ML PO SYRP: 2.5000 mL | ORAL_SOLUTION | Freq: Four times a day (QID) | ORAL | 0 refills | Status: DC | PRN

## 2018-03-15 MED ORDER — POTASSIUM CHLORIDE CRYS ER 10 MEQ PO TBCR: 10.0000 meq | EXTENDED_RELEASE_TABLET | Freq: Every day | ORAL | 1 refills | Status: DC

## 2018-03-15 MED ORDER — PRAVASTATIN SODIUM 40 MG PO TABS: 40.0000 mg | ORAL_TABLET | Freq: Every day | ORAL | 1 refills | Status: DC

## 2018-03-15 MED ORDER — LISINOPRIL-HYDROCHLOROTHIAZIDE 20-25 MG PO TABS: 1.0000 | ORAL_TABLET | Freq: Every day | ORAL | 1 refills | Status: DC

## 2018-03-15 NOTE — Patient Instructions (Addendum)
-   Recommend using neti pot, continue zyrtec, flonase and musinex. Add on cough syrup to help you get rest. Drink plenty of water, and good nutrition- protein and vitamin C.  - Start taking antibiotic; finish course even if improved symptoms before. Take probiotic daily for one month after completion of antibiotic.   Try to use PLAIN allergy medicine without the decongestant due to increasing high blood pressure. Avoid: phenylephrine, phenylpropanolamine, and pseudoephredine  For Fever/Pain: Acetaminophen every 6 hours as needed (maximum of 3000mg  a day). If you are still uncomfortable you can add ibuprofen OR naproxen  For coughing: try dextromethorphan for a cough suppressant, and/or a cool mist humidifier, lozenges  For sore throat: saline gargles, honey herbal tea, lozenges, throat spray  To dry out your nose: try an antihistamine like loratadine (non-sedating) or diphenhydramine (sedating) or others To relieve a stuffy nose: try flonase, neti pot, nasalsaline  To make blowing your nose easier: guaifenesin

## 2018-03-15 NOTE — Progress Notes (Signed)
BP 130/76   Pulse 72   Temp 100 F (37.8 C)   Ht 5\' 10"  (1.778 m)   Wt 246 lb 3.2 oz (111.7 kg)   LMP  (LMP Unknown)   SpO2 98%   BMI 35.33 kg/m    Subjective:    Patient ID: Bethany Bowman, female    DOB: Jul 02, 1950, 67 y.o.   MRN: [id number]  HPI: Bethany Bowman is a 67 y.o. female  Chief Complaint  Patient presents with  . Follow-up  . Sore Throat    Fever, cough, sinus pressure, headache, runny eyes- onset Monday    HPI  Patient presents for acute visit and routine follow-up.  Patient first had sinus pressure and headache started on monday, then started getting coughing, sore throat, and chest soreness. States bilateral watery eyes now. Nasal congestion and cough so severe she cannot sleep through the night. Did note fever 2 nights ago and today.  Does not smoke. Denies shortness of breath, body aches, nausea.  States grandchild had it and has been spreading to other household.  Has tried robitussin, sudafed, zyrtec and daily flonase. States felt better on Wednesday and then got significantly worse and has episode of diaphoresis and feeling feverish yesterday.   Hypertension: patient is rx atenolol 50mg  daily, lisinopril 20mg  and HCTZ 35mg  daily. Takes 38mew of potassium daily due to chronic hypokalemia due to diuretic use per patient.   BP Readings from Last 3 Encounters:  03/15/18 130/76  01/15/18 140/84  12/13/17 124/70    Hyperlipidemia: patient takes pravastatin 40mg  at night  Lab Results  Component Value Date   CHOL 186 09/12/2017   HDL 47 (L) 09/12/2017   LDLCALC 116 (H) 09/12/2017   TRIG 123 09/12/2017   CHOLHDL 4.0 09/12/2017     Depression screen PHQ 2/9 03/15/2018 12/13/2017 09/12/2017 03/15/2017 08/24/2016  Decreased Interest 0 0 0 0 0  Down, Depressed, Hopeless 0 0 0 0 0  PHQ - 2 Score 0 0 0 0 0  Altered sleeping - 0 - - -  Tired, decreased energy - 0 - - -  Change in appetite - 0 - - -  Feeling bad or failure about yourself  - 0 - - -  Trouble  concentrating - 0 - - -  Moving slowly or fidgety/restless - 0 - - -  Suicidal thoughts - 0 - - -  PHQ-9 Score - 0 - - -  Difficult doing work/chores - Not difficult at all - - -   Fall Risk  03/15/2018 12/13/2017 09/12/2017 03/15/2017 08/24/2016  Falls in the past year? No No No No No  Risk for fall due to : - Impaired vision - - -  Risk for fall due to: Comment - wears eyeglasses - - -    Relevant past medical, surgical, family and social history reviewed Past Medical History:  Diagnosis Date  . Allergy   . GERD (gastroesophageal reflux disease)   . Hyperlipidemia   . Hypertension    Past Surgical History:  Procedure Laterality Date  . COLONOSCOPY WITH PROPOFOL N/A 12/28/2016   Procedure: COLONOSCOPY WITH PROPOFOL;  Surgeon: Jonathon Bellows, MD;  Location: [facility] ENDOSCOPY;  Service: Endoscopy;  Laterality: N/A;  . COLONOSCOPY WITH PROPOFOL N/A 01/15/2018   Procedure: COLONOSCOPY WITH PROPOFOL;  Surgeon: Jonathon Bellows, MD;  Location: [facility] ENDOSCOPY;  Service: Gastroenterology;  Laterality: N/A;   Family History  Problem Relation Age of Onset  . Colon cancer Mother   . Hypertension Mother   .  Alcohol abuse Father   . Hypertension Father   . Hypertension Sister   . Hypertension Brother   . Prostate cancer Brother   . Bone cancer Brother   . Lung cancer Brother   . Hypertension Brother   . Hypertension Brother   . Breast cancer Neg Hx    Social History   Tobacco Use  . Smoking status: Former Smoker    Packs/day: 0.50    Years: 12.00    Pack years: 6.00    Types: Cigarettes    Last attempt to quit: 08/03/1981    Years since quitting: 36.6  . Smokeless tobacco: Never Used  . Tobacco comment: smoking cessation materials not required  Substance Use Topics  . Alcohol use: No    Alcohol/week: 0.0 standard drinks  . Drug use: No     Office Visit from 03/15/2018 in [facility]  AUDIT-C Score  0      Interim medical history since last visit reviewed. Allergies  and medications reviewed  Review of Systems  Constitutional: Positive for activity change (decreased due to acute illness), chills, diaphoresis, fatigue and fever.  HENT: Positive for rhinorrhea, sinus pressure, sinus pain and sore throat. Negative for ear pain, facial swelling, hearing loss, nosebleeds and tinnitus.   Eyes: Negative for visual disturbance.  Respiratory: Positive for cough. Negative for chest tightness and shortness of breath.   Cardiovascular: Negative for chest pain and palpitations.  Gastrointestinal: Negative for abdominal pain, constipation and diarrhea.  Endocrine: Negative for cold intolerance, heat intolerance, polydipsia and polyuria.  Genitourinary: Negative for dysuria.  Musculoskeletal: Negative for myalgias.  Skin: Negative for rash.  Neurological: Positive for headaches. Negative for dizziness and weakness.   Per HPI unless specifically indicated above     Objective:      Wt Readings from Last 3 Encounters:  03/15/18 246 lb 3.2 oz (111.7 kg)  01/15/18 243 lb (110.2 kg)  12/13/17 243 lb 3.2 oz (110.3 kg)    Physical Exam  Constitutional: She appears well-developed and well-nourished.  HENT:  Head: Normocephalic and atraumatic.  Right Ear: Hearing, tympanic membrane and ear canal normal.  Left Ear: Hearing, tympanic membrane and ear canal normal.  Nose: Mucosal edema present. Right sinus exhibits maxillary sinus tenderness and frontal sinus tenderness. Left sinus exhibits frontal sinus tenderness.  Mouth/Throat: Uvula is midline, oropharynx is clear and moist and mucous membranes are normal. No oropharyngeal exudate (mild posterior erythema) or posterior oropharyngeal edema.  Eyes: Pupils are equal, round, and reactive to light. EOM are normal.  Neck: Normal range of motion. Neck supple. Carotid bruit is not present.  Cardiovascular: Normal heart sounds and intact distal pulses.  Pulmonary/Chest: Effort normal and breath sounds normal.  Abdominal:  Soft. Bowel sounds are normal. There is no tenderness. There is no CVA tenderness.  Lymphadenopathy:    She has no cervical adenopathy.  Neurological: She is alert. She has normal strength. No sensory deficit. Coordination and gait normal. GCS eye subscore is 4. GCS verbal subscore is 5. GCS motor subscore is 6.  Skin: Skin is warm, dry and intact.  Psychiatric: She has a normal mood and affect. Her speech is normal and behavior is normal. Judgment normal.  Vitals reviewed.       Assessment & Plan:   Problem List Items Addressed This Visit      Cardiovascular and Mediastinum   Benign essential HTN   Relevant Medications   pravastatin (PRAVACHOL) 40 MG tablet   lisinopril-hydrochlorothiazide (PRINZIDE,ZESTORETIC) 20-25  MG tablet   atenolol (TENORMIN) 50 MG tablet   Other Relevant Orders   COMPLETE METABOLIC PANEL WITH GFR     Other   Hyperlipidemia LDL goal <100   Relevant Medications   pravastatin (PRAVACHOL) 40 MG tablet   lisinopril-hydrochlorothiazide (PRINZIDE,ZESTORETIC) 20-25 MG tablet   atenolol (TENORMIN) 50 MG tablet   Other Relevant Orders   Lipid Profile   Obesity (BMI 35.0-39.9 without comorbidity)    Other Visit Diagnoses    Fever, unspecified fever cause    -  Primary   Relevant Orders   POCT Influenza A/B   Seasonal allergies       Relevant Medications   fluticasone (FLONASE) 50 MCG/ACT nasal spray   Acute non-recurrent pansinusitis       Relevant Medications   fluticasone (FLONASE) 50 MCG/ACT nasal spray   promethazine-dextromethorphan (PROMETHAZINE-DM) 6.25-15 MG/5ML syrup   amoxicillin-clavulanate (AUGMENTIN) 875-125 MG tablet   Medication monitoring encounter       Relevant Orders   COMPLETE METABOLIC PANEL WITH GFR   Diuretic-induced hypokalemia       Relevant Medications   potassium chloride (K-DUR,KLOR-CON) 10 MEQ tablet   Cough       Relevant Medications   promethazine-dextromethorphan (PROMETHAZINE-DM) 6.25-15 MG/5ML syrup    amoxicillin-clavulanate (AUGMENTIN) 875-125 MG tablet       Follow up plan: Return in about 6 months (around 09/13/2018) for Routine.  An after-visit summary was printed and given to the patient at [facility].  Please see the patient instructions which may contain other information and recommendations beyond what is mentioned above in the assessment and plan.  Meds ordered this encounter  Medications  . pravastatin (PRAVACHOL) 40 MG tablet    Sig: Take 1 tablet (40 mg total) by mouth daily.    Dispense:  90 tablet    Refill:  1    Order Specific Question:   Supervising Provider    Answer:   Holston Oyama, Satira Anis [id number]  . lisinopril-hydrochlorothiazide (PRINZIDE,ZESTORETIC) 20-25 MG tablet    Sig: Take 1 tablet by mouth daily.    Dispense:  90 tablet    Refill:  1    Order Specific Question:   Supervising Provider    Answer:   Mckaylie Vasey, Satira Anis [id number]  . atenolol (TENORMIN) 50 MG tablet    Sig: Take 1 tablet (50 mg total) by mouth daily.    Dispense:  90 tablet    Refill:  1    Order Specific Question:   Supervising Provider    Answer:   Shervin Cypert, Satira Anis [id number]  . fluticasone (FLONASE) 50 MCG/ACT nasal spray    Sig: Place 2 sprays into both nostrils daily.    Dispense:  16 g    Refill:  6    Order Specific Question:   Supervising Provider    Answer:   Jenafer Winterton, Satira Anis [id number]  . potassium chloride (K-DUR,KLOR-CON) 10 MEQ tablet    Sig: Take 1 tablet (10 mEq total) by mouth daily.    Dispense:  90 tablet    Refill:  1    Order Specific Question:   Supervising Provider    Answer:   Ikaika Showers, Satira Anis [id number]  . promethazine-dextromethorphan (PROMETHAZINE-DM) 6.25-15 MG/5ML syrup    Sig: Take 2.5 mLs by mouth 4 (four) times daily as needed for cough.    Dispense:  118 mL    Refill:  0    Order Specific Question:   Supervising Provider    Answer:   Lylianna Fraiser  P [id number]  . amoxicillin-clavulanate (AUGMENTIN) 875-125 MG tablet    Sig: Take 1 tablet by mouth 2 (two) times daily.     Dispense:  14 tablet    Refill:  0    Order Specific Question:   Supervising Provider    AnswerArnetha Courser [[id number]]    Orders Placed This Encounter  Procedures  . COMPLETE METABOLIC PANEL WITH GFR  . Lipid Profile  . POCT Influenza A/B

## 2018-03-22 ENCOUNTER — Other Ambulatory Visit: Payer: Self-pay

## 2018-03-26 NOTE — Telephone Encounter (Signed)
Patient is calling for a update on her medication  pravastatin (PRAVACHOL) 40 MG tablet. [place] contacted her in regards to this medication change and refill request.please advise best contact number [phone]

## 2018-03-27 NOTE — Telephone Encounter (Signed)
Would like to improve cholesterol control.  __________________________________ Notes recorded by Fredderick Severance, NP on 03/15/2018 at 4:41 PM EDT Lab Results  Component Value Date   CHOL 185 03/15/2018   HDL 45 (L) 03/15/2018   LDLCALC 117 (H) 03/15/2018   TRIG 120 03/15/2018   CHOLHDL 4.1 03/15/2018    Sugar, kidney, liver and electrolytes all look good. Bad cholesterol is still mildly elevated and good cholesterol HDL is low. I would actually like to switch you to a stronger cholesterol medicine. Please do not get your pravastatin filled and instead get the atorvastatin 40mg  tablets.   Bad cholesterol, also called low-density lipoprotein (LDL), carries cholesterol and other fats that your liver makes to your body tissue. If it builds up in blood vessels, LDL can cause heart disease and other health problems. Your LDL level should be below 100. If you have diabetes or a possible heart problem, your LDL should be below 70.  Eat: Eat 20 to 30 grams of soluble fiber every day. Foods such as fruits and vegetables, whole grains, beans, peas, nuts, and seeds can help lower LDL. Avoid: Saturated fats (Dairy foods - such as butter, cream, ghee, regular-fat milk and cheese. Meat - such as fatty cuts of beef, pork and lamb, processed meats like salami, sausages and the skin on chicken. Lard., fatty snack foods, cakes, biscuits, pies and deep fried foods)  HDL removes extra cholesterol and plaque buildup in your arteries and then sends it to your liver to get rid of and helps reduce your risk of heart disease, heart attack, and stroke.  Foods that increase HDL: beans and legumes, whole grains, high-fiber fruits:prunes, apples, and pears; fatty fish- salmon, tuna, sardines; nuts, olive oil

## 2018-03-27 NOTE — Telephone Encounter (Signed)
Please advise before I call this patient. Why was medication changed.

## 2018-03-27 NOTE — Telephone Encounter (Signed)
Patient called.  Patient aware.  

## 2018-08-07 ENCOUNTER — Other Ambulatory Visit: Payer: Self-pay | Admitting: Nurse Practitioner

## 2018-08-07 DIAGNOSIS — J302 Other seasonal allergic rhinitis: Secondary | ICD-10-CM

## 2018-08-07 DIAGNOSIS — E785 Hyperlipidemia, unspecified: Secondary | ICD-10-CM

## 2018-08-07 DIAGNOSIS — J0140 Acute pansinusitis, unspecified: Secondary | ICD-10-CM

## 2018-08-07 DIAGNOSIS — I10 Essential (primary) hypertension: Secondary | ICD-10-CM

## 2018-08-29 ENCOUNTER — Other Ambulatory Visit: Payer: Self-pay | Admitting: Nurse Practitioner

## 2018-08-29 DIAGNOSIS — I10 Essential (primary) hypertension: Secondary | ICD-10-CM

## 2018-09-09 ENCOUNTER — Other Ambulatory Visit: Payer: Self-pay | Admitting: Nurse Practitioner

## 2018-09-09 DIAGNOSIS — T502X5A Adverse effect of carbonic-anhydrase inhibitors, benzothiadiazides and other diuretics, initial encounter: Principal | ICD-10-CM

## 2018-09-09 DIAGNOSIS — E876 Hypokalemia: Secondary | ICD-10-CM

## 2018-09-13 ENCOUNTER — Ambulatory Visit: Payer: Medicare HMO | Admitting: Family Medicine

## 2018-12-12 ENCOUNTER — Other Ambulatory Visit: Payer: Self-pay | Admitting: Family Medicine

## 2018-12-12 DIAGNOSIS — E785 Hyperlipidemia, unspecified: Secondary | ICD-10-CM

## 2018-12-12 DIAGNOSIS — I10 Essential (primary) hypertension: Secondary | ICD-10-CM

## 2018-12-12 DIAGNOSIS — J302 Other seasonal allergic rhinitis: Secondary | ICD-10-CM

## 2018-12-12 DIAGNOSIS — J0140 Acute pansinusitis, unspecified: Secondary | ICD-10-CM

## 2018-12-13 NOTE — Telephone Encounter (Signed)
Needs routine followup in 3 months

## 2018-12-17 ENCOUNTER — Ambulatory Visit (INDEPENDENT_AMBULATORY_CARE_PROVIDER_SITE_OTHER): Payer: Medicare HMO

## 2018-12-17 VITALS — Ht 69.0 in | Wt 226.8 lb

## 2018-12-17 DIAGNOSIS — Z780 Asymptomatic menopausal state: Secondary | ICD-10-CM

## 2018-12-17 DIAGNOSIS — Z0000 Encounter for general adult medical examination without abnormal findings: Secondary | ICD-10-CM | POA: Diagnosis not present

## 2018-12-17 DIAGNOSIS — Z1231 Encounter for screening mammogram for malignant neoplasm of breast: Secondary | ICD-10-CM

## 2018-12-17 NOTE — Patient Instructions (Signed)
Bethany Bowman , Thank you for taking time to come for your Medicare Wellness Visit. I appreciate your ongoing commitment to your health goals. Please review the following plan we discussed and let me know if I can assist you in the future.   Screening recommendations/referrals: Colonoscopy: done 01/15/18. Repeat in 2022. Mammogram: done 10/15/17. Please call [phone] to schedule your mammogram and bone density screening. Bone Density: done 10/09/16.  Recommended yearly ophthalmology/optometry visit for glaucoma screening and checkup Recommended yearly dental visit for hygiene and checkup  Vaccinations: Influenza vaccine: postponed Pneumococcal vaccine: done 04/03/14 Tdap vaccine: done 08/26/12 Shingles vaccine: Shingrix discussed. Please contact your pharmacy for coverage information.   Advanced directives: Please bring a copy of your health care power of attorney and living will to the office at your convenience once you have completed that paperwork.  Conditions/risks identified: Keep up the great work!  Next appointment: Please follow up in one year for your Medicare Annual Wellness visit.     Preventive Care 6 Years and Older, Female Preventive care refers to lifestyle choices and visits with your health care provider that can promote health and wellness. What does preventive care include?  A yearly physical exam. This is also called an annual well check.  Dental exams once or twice a year.  Routine eye exams. Ask your health care provider how often you should have your eyes checked.  Personal lifestyle choices, including:  Daily care of your teeth and gums.  Regular physical activity.  Eating a healthy diet.  Avoiding tobacco and drug use.  Limiting alcohol use.  Practicing safe sex.  Taking low-dose aspirin every day.  Taking vitamin and mineral supplements as recommended by your health care provider. What happens during an annual well check? The services and  screenings done by your health care provider during your annual well check will depend on your age, overall health, lifestyle risk factors, and family history of disease. Counseling  Your health care provider may ask you questions about your:  Alcohol use.  Tobacco use.  Drug use.  Emotional well-being.  Home and relationship well-being.  Sexual activity.  Eating habits.  History of falls.  Memory and ability to understand (cognition).  Work and work Statistician.  Reproductive health. Screening  You may have the following tests or measurements:  Height, weight, and BMI.  Blood pressure.  Lipid and cholesterol levels. These may be checked every 5 years, or more frequently if you are over 77 years old.  Skin check.  Lung cancer screening. You may have this screening every year starting at age 9 if you have a 30-pack-year history of smoking and currently smoke or have quit within the past 15 years.  Fecal occult blood test (FOBT) of the stool. You may have this test every year starting at age 65.  Flexible sigmoidoscopy or colonoscopy. You may have a sigmoidoscopy every 5 years or a colonoscopy every 10 years starting at age 18.  Hepatitis C blood test.  Hepatitis B blood test.  Sexually transmitted disease (STD) testing.  Diabetes screening. This is done by checking your blood sugar (glucose) after you have not eaten for a while (fasting). You may have this done every 1-3 years.  Bone density scan. This is done to screen for osteoporosis. You may have this done starting at age 63.  Mammogram. This may be done every 1-2 years. Talk to your health care provider about how often you should have regular mammograms. Talk with your health care provider  about your test results, treatment options, and if necessary, the need for more tests. Vaccines  Your health care provider may recommend certain vaccines, such as:  Influenza vaccine. This is recommended every year.   Tetanus, diphtheria, and acellular pertussis (Tdap, Td) vaccine. You may need a Td booster every 10 years.  Zoster vaccine. You may need this after age 85.  Pneumococcal 13-valent conjugate (PCV13) vaccine. One dose is recommended after age 39.  Pneumococcal polysaccharide (PPSV23) vaccine. One dose is recommended after age 53. Talk to your health care provider about which screenings and vaccines you need and how often you need them. This information is not intended to replace advice given to you by your health care provider. Make sure you discuss any questions you have with your health care provider. Document Released: 07/02/2015 Document Revised: 02/23/2016 Document Reviewed: 04/06/2015 Elsevier Interactive Patient Education  2017 [employer] Prevention in the Home Falls can cause injuries. They can happen to people of all ages. There are many things you can do to make your home safe and to help prevent falls. What can I do on the outside of my home?  Regularly fix the edges of walkways and driveways and fix any cracks.  Remove anything that might make you trip as you walk through a door, such as a raised step or threshold.  Trim any bushes or trees on the path to your home.  Use bright outdoor lighting.  Clear any walking paths of anything that might make someone trip, such as rocks or tools.  Regularly check to see if handrails are loose or broken. Make sure that both sides of any steps have handrails.  Any raised decks and porches should have guardrails on the edges.  Have any leaves, snow, or ice cleared regularly.  Use sand or salt on walking paths during winter.  Clean up any spills in your garage right away. This includes oil or grease spills. What can I do in the bathroom?  Use night lights.  Install grab bars by the toilet and in the tub and shower. Do not use towel bars as grab bars.  Use non-skid mats or decals in the tub or shower.  If you need to sit  down in the shower, use a plastic, non-slip stool.  Keep the floor dry. Clean up any water that spills on the floor as soon as it happens.  Remove soap buildup in the tub or shower regularly.  Attach bath mats securely with double-sided non-slip rug tape.  Do not have throw rugs and other things on the floor that can make you trip. What can I do in the bedroom?  Use night lights.  Make sure that you have a light by your bed that is easy to reach.  Do not use any sheets or blankets that are too big for your bed. They should not hang down onto the floor.  Have a firm chair that has side arms. You can use this for support while you get dressed.  Do not have throw rugs and other things on the floor that can make you trip. What can I do in the kitchen?  Clean up any spills right away.  Avoid walking on wet floors.  Keep items that you use a lot in easy-to-reach places.  If you need to reach something above you, use a strong step stool that has a grab bar.  Keep electrical cords out of the way.  Do not use floor polish or  wax that makes floors slippery. If you must use wax, use non-skid floor wax.  Do not have throw rugs and other things on the floor that can make you trip. What can I do with my stairs?  Do not leave any items on the stairs.  Make sure that there are handrails on both sides of the stairs and use them. Fix handrails that are broken or loose. Make sure that handrails are as long as the stairways.  Check any carpeting to make sure that it is firmly attached to the stairs. Fix any carpet that is loose or worn.  Avoid having throw rugs at the top or bottom of the stairs. If you do have throw rugs, attach them to the floor with carpet tape.  Make sure that you have a light switch at the top of the stairs and the bottom of the stairs. If you do not have them, ask someone to add them for you. What else can I do to help prevent falls?  Wear shoes that:  Do not have  high heels.  Have rubber bottoms.  Are comfortable and fit you well.  Are closed at the toe. Do not wear sandals.  If you use a stepladder:  Make sure that it is fully opened. Do not climb a closed stepladder.  Make sure that both sides of the stepladder are locked into place.  Ask someone to hold it for you, if possible.  Clearly mark and make sure that you can see:  Any grab bars or handrails.  First and last steps.  Where the edge of each step is.  Use tools that help you move around (mobility aids) if they are needed. These include:  Canes.  Walkers.  Scooters.  Crutches.  Turn on the lights when you go into a dark area. Replace any light bulbs as soon as they burn out.  Set up your furniture so you have a clear path. Avoid moving your furniture around.  If any of your floors are uneven, fix them.  If there are any pets around you, be aware of where they are.  Review your medicines with your doctor. Some medicines can make you feel dizzy. This can increase your chance of falling. Ask your doctor what other things that you can do to help prevent falls. This information is not intended to replace advice given to you by your health care provider. Make sure you discuss any questions you have with your health care provider. Document Released: 04/01/2009 Document Revised: 11/11/2015 Document Reviewed: 07/10/2014 Elsevier Interactive Patient Education  2017 [employer].

## 2018-12-17 NOTE — Progress Notes (Signed)
Subjective:   Bethany Bowman is a 68 y.o. female who presents for Medicare Annual (Subsequent) preventive examination.  Virtual Visit via Telephone Note  I connected with Bethany Bowman on 12/17/18 at  3:00 PM EDT by telephone and verified that I am speaking with the correct person using two identifiers.  Medicare Annual Wellness visit completed telephonically due to Covid-19 pandemic.   Location: Patient: home Provider: office   I discussed the limitations, risks, security and privacy concerns of performing an evaluation and management service by telephone and the availability of in person appointments. The patient expressed understanding and agreed to proceed.  Some vital signs may be absent or patient reported. Patient does not have an at home blood pressure monitor.   Clemetine Marker, LPN  Review of Systems:    Cardiac Risk Factors include: advanced age (>8men, >48 women);hypertension;dyslipidemia;obesity (BMI >30kg/m2)     Objective:     Vitals: Ht 5\' 9"  (1.753 m)   Wt 226 lb 12.8 oz (102.9 kg)   LMP  (LMP Unknown)   BMI 33.49 kg/m   Body mass index is 33.49 kg/m.  Advanced Directives 12/17/2018 01/15/2018 12/13/2017 03/15/2017 12/28/2016 08/24/2016 02/18/2016  Does Patient Have a Medical Advance Directive? No No No No No No No  Would patient like information on creating a medical advance directive? No - Patient declined No - Patient declined Yes (MAU/Ambulatory/Procedural Areas - Information given) - No - Patient declined - No - patient declined information    Tobacco Social History   Tobacco Use  Smoking Status Former Smoker  . Packs/day: 0.50  . Years: 12.00  . Pack years: 6.00  . Types: Cigarettes  . Quit date: 08/03/1981  . Years since quitting: 37.3  Smokeless Tobacco Never Used  Tobacco Comment   smoking cessation materials not required     Counseling given: Not Answered Comment: smoking cessation materials not required   Clinical Intake:  Pre-visit  preparation completed: Yes  Pain : No/denies pain     BMI - recorded: 33.49 Nutritional Status: BMI > 30  Obese Nutritional Risks: None Diabetes: No  How often do you need to have someone help you when you read instructions, pamphlets, or other written materials from your doctor or pharmacy?: 1 - Never  Interpreter Needed?: No  Information entered by :: Clemetine Marker LPN  Past Medical History:  Diagnosis Date  . Allergy   . GERD (gastroesophageal reflux disease)   . Hyperlipidemia   . Hypertension    Past Surgical History:  Procedure Laterality Date  . COLONOSCOPY WITH PROPOFOL N/A 12/28/2016   Procedure: COLONOSCOPY WITH PROPOFOL;  Surgeon: Jonathon Bellows, MD;  Location: [facility] ENDOSCOPY;  Service: Endoscopy;  Laterality: N/A;  . COLONOSCOPY WITH PROPOFOL N/A 01/15/2018   Procedure: COLONOSCOPY WITH PROPOFOL;  Surgeon: Jonathon Bellows, MD;  Location: [facility] ENDOSCOPY;  Service: Gastroenterology;  Laterality: N/A;   Family History  Problem Relation Age of Onset  . Colon cancer Mother   . Hypertension Mother   . Alcohol abuse Father   . Hypertension Father   . Hypertension Sister   . Hypertension Brother   . Prostate cancer Brother   . Bone cancer Brother   . Lung cancer Brother   . Hypertension Brother   . Hypertension Brother   . Breast cancer Neg Hx    Social History   Socioeconomic History  . Marital status: Married    Spouse name: Lavera Guise  . Number of children: 2  . Years of education:  some college  . Highest education level: 12th grade  Occupational History  . Not on file  Social Needs  . Financial resource strain: Not hard at all  . Food insecurity    Worry: Never true    Inability: Never true  . Transportation needs    Medical: No    Non-medical: No  Tobacco Use  . Smoking status: Former Smoker    Packs/day: 0.50    Years: 12.00    Pack years: 6.00    Types: Cigarettes    Quit date: 08/03/1981    Years since quitting: 37.3  . Smokeless tobacco: Never  Used  . Tobacco comment: smoking cessation materials not required  Substance and Sexual Activity  . Alcohol use: No    Alcohol/week: 0.0 standard drinks  . Drug use: No  . Sexual activity: Not Currently  Lifestyle  . Physical activity    Days per week: 2 days    Minutes per session: 30 min  . Stress: Only a little  Relationships  . Social connections    Talks on phone: More than three times a week    Gets together: More than three times a week    Attends religious service: More than 4 times per year    Active member of club or organization: No    Attends meetings of clubs or organizations: Never    Relationship status: Married  Other Topics Concern  . Not on file  Social History Narrative  . Not on file    Outpatient Encounter Medications as of 12/17/2018  Medication Sig  . aspirin EC 81 MG tablet Take 1 tablet (81 mg total) by mouth daily.  Marland Kitchen atenolol (TENORMIN) 50 MG tablet TAKE 1 TABLET EVERY DAY  . atorvastatin (LIPITOR) 40 MG tablet TAKE 1 TABLET EVERY DAY  . cholecalciferol (VITAMIN D) 1000 units tablet Take 1,000 Units by mouth daily.  . fluticasone (FLONASE) 50 MCG/ACT nasal spray USE 2 SPRAYS IN EACH NOSTRIL EVERY DAY  . lisinopril-hydrochlorothiazide (PRINZIDE,ZESTORETIC) 20-25 MG tablet TAKE 1 TABLET EVERY DAY  . potassium chloride (K-DUR,KLOR-CON) 10 MEQ tablet TAKE 1 TABLET EVERY DAY  . [DISCONTINUED] amoxicillin-clavulanate (AUGMENTIN) 875-125 MG tablet Take 1 tablet by mouth 2 (two) times daily.  . [DISCONTINUED] promethazine-dextromethorphan (PROMETHAZINE-DM) 6.25-15 MG/5ML syrup Take 2.5 mLs by mouth 4 (four) times daily as needed for cough.   No facility-administered encounter medications on file as of 12/17/2018.     Activities of Daily Living In your present state of health, do you have any difficulty performing the following activities: 12/17/2018 03/15/2018  Hearing? N N  Comment declines hearing aids -  Vision? N N  Comment wears glasses -  Difficulty  concentrating or making decisions? N N  Walking or climbing stairs? N N  Dressing or bathing? N N  Doing errands, shopping? N N  Preparing Food and eating ? N -  Using the Toilet? N -  In the past six months, have you accidently leaked urine? N -  Do you have problems with loss of bowel control? N -  Managing your Medications? N -  Managing your Finances? N -  Housekeeping or managing your Housekeeping? N -  Some recent data might be hidden    Patient Care Team: Lada, Satira Anis, MD as PCP - General (Family Medicine)    Assessment:   This is a routine wellness examination for Benson.  Exercise Activities and Dietary recommendations Current Exercise Habits: Home exercise routine, Type of exercise: walking, Time (  Minutes): 30, Frequency (Times/Week): 2, Weekly Exercise (Minutes/Week): 60, Intensity: Mild, Exercise limited by: None identified  Goals    . DIET - INCREASE WATER INTAKE     Recommend to drink at least 6-8 8oz glasses of water per day.       Fall Risk Fall Risk  12/17/2018 03/15/2018 12/13/2017 09/12/2017 03/15/2017  Falls in the past year? 0 No No No No  Number falls in past yr: 0 - - - -  Injury with Fall? 0 - - - -  Risk for fall due to : - - Impaired vision - -  Risk for fall due to: Comment - - wears eyeglasses - -  Follow up Falls prevention discussed - - - -   FALL RISK PREVENTION PERTAINING TO THE HOME:  Any stairs in or around the home? Yes  If so, do they handrails? Yes   Home free of loose throw rugs in walkways, pet beds, electrical cords, etc? Yes  Adequate lighting in your home to reduce risk of falls? Yes   ASSISTIVE DEVICES UTILIZED TO PREVENT FALLS:  Life alert? No  Use of a cane, walker or w/c? No  Grab bars in the bathroom? No  Shower chair or bench in shower? No  Elevated toilet seat or a handicapped toilet? Yes   DME ORDERS:  DME order needed?  No   TIMED UP AND GO:  Was the test performed? No . Telephonic visit.  Education: Fall  risk prevention has been discussed.  Intervention(s) required? No   Depression Screen PHQ 2/9 Scores 12/17/2018 03/15/2018 12/13/2017 09/12/2017  PHQ - 2 Score 0 0 0 0  PHQ- 9 Score - - 0 -     Cognitive Function     6CIT Screen 12/17/2018 12/13/2017  What Year? 0 points 0 points  What month? 0 points 0 points  What time? 0 points 0 points  Count back from 20 0 points 0 points  Months in reverse 0 points 0 points  Repeat phrase 0 points 0 points  Total Score 0 0    Immunization History  Administered Date(s) Administered  . Influenza Split 01/11/2010  . Influenza, High Dose Seasonal PF 02/24/2017  . Influenza, Seasonal, Injecte, Preservative Fre 12/19/2010, 01/28/2014  . Influenza-Unspecified 02/15/2015  . Pneumococcal Conjugate-13 04/03/2014  . Pneumococcal Polysaccharide-23 04/02/2013  . Tdap 08/26/2012  . Zoster 02/12/2012    Qualifies for Shingles Vaccine? Yes  Zostavax completed 2013. Due for Shingrix. Education has been provided regarding the importance of this vaccine. Pt has been advised to call insurance company to determine out of pocket expense. Advised may also receive vaccine at local pharmacy or Health Dept. Verbalized acceptance and understanding.  Tdap: Up to date  Flu Vaccine: Due for Flu vaccine. Does the patient want to receive this vaccine today?  No . Education has been provided regarding the importance of this vaccine but still declined. Advised may receive this vaccine at local pharmacy or Health Dept. Aware to provide a copy of the vaccination record if obtained from local pharmacy or Health Dept. Verbalized acceptance and understanding.  Pneumococcal Vaccine: Up to date   Screening Tests Health Maintenance  Topic Date Due  . PNA vac Low Risk Adult (2 of 2 - PPSV23) 04/02/2018  . MAMMOGRAM  10/16/2018  . INFLUENZA VACCINE  01/18/2019  . COLONOSCOPY  01/15/2021  . TETANUS/TDAP  08/27/2022  . DEXA SCAN  Completed  . Hepatitis C Screening  Completed     Cancer Screenings:  Colorectal Screening: Completed 01/15/18. Repeat every 3 years  Mammogram: Completed 10/15/17. Ordered today. Pt provided with contact information and advised to call to schedule appt.   Bone Density: Completed 10/09/16. Results reflect NORMAL. Repeat every 2 years. Ordered today. Pt provided with contact information and advised to call to schedule appt.   Lung Cancer Screening: (Low Dose CT Chest recommended if Age 24-80 years, 30 pack-year currently smoking OR have quit w/in 15years.) does not qualify.     Additional Screening:  Hepatitis C Screening: does qualify; Completed 08/24/16  Vision Screening: Recommended annual ophthalmology exams for early detection of glaucoma and other disorders of the eye. Is the patient up to date with their annual eye exam?  Yes  Who is the provider or what is the name of the office in which the pt attends annual eye exams? [facility]  Dental Screening: Recommended annual dental exams for proper oral hygiene  Community Resource Referral:  CRR required this visit?  No      Plan:     I have personally reviewed and addressed the Medicare Annual Wellness questionnaire and have noted the following in the patient's chart:  A. Medical and social history B. Use of alcohol, tobacco or illicit drugs  C. Current medications and supplements D. Functional ability and status E.  Nutritional status F.  Physical activity G. Advance directives H. List of other physicians I.  Hospitalizations, surgeries, and ER visits in previous 12 months J.  [place] such as hearing and vision if needed, cognitive and depression L. Referrals and appointments   In addition, I have reviewed and discussed with patient certain preventive protocols, quality metrics, and best practice recommendations. A written personalized care plan for preventive services as well as general preventive health recommendations were provided to patient.    Signed,  Clemetine Marker, LPN Nurse Health Advisor   Nurse Notes: pt doing well and appreciative of visit today

## 2019-01-03 ENCOUNTER — Other Ambulatory Visit: Payer: Self-pay | Admitting: Nurse Practitioner

## 2019-01-03 DIAGNOSIS — I10 Essential (primary) hypertension: Secondary | ICD-10-CM

## 2019-01-20 ENCOUNTER — Other Ambulatory Visit: Payer: Self-pay | Admitting: Nurse Practitioner

## 2019-01-20 DIAGNOSIS — E876 Hypokalemia: Secondary | ICD-10-CM

## 2019-02-18 ENCOUNTER — Ambulatory Visit
Admission: RE | Admit: 2019-02-18 | Discharge: 2019-02-18 | Disposition: A | Discharge status: Home / Self Care | Payer: Medicare HMO | Source: Ambulatory Visit | Attending: Family Medicine | Admitting: Family Medicine

## 2019-02-18 DIAGNOSIS — Z13820 Encounter for screening for osteoporosis: Secondary | ICD-10-CM | POA: Diagnosis not present

## 2019-02-18 DIAGNOSIS — Z780 Asymptomatic menopausal state: Secondary | ICD-10-CM

## 2019-02-18 DIAGNOSIS — Z1231 Encounter for screening mammogram for malignant neoplasm of breast: Secondary | ICD-10-CM | POA: Diagnosis not present

## 2019-03-19 ENCOUNTER — Encounter: Payer: Self-pay | Admitting: Family Medicine

## 2019-03-19 ENCOUNTER — Ambulatory Visit (INDEPENDENT_AMBULATORY_CARE_PROVIDER_SITE_OTHER): Payer: Medicare HMO | Admitting: Family Medicine

## 2019-03-19 ENCOUNTER — Other Ambulatory Visit: Payer: Self-pay

## 2019-03-19 DIAGNOSIS — E669 Obesity, unspecified: Secondary | ICD-10-CM

## 2019-03-19 DIAGNOSIS — I10 Essential (primary) hypertension: Secondary | ICD-10-CM | POA: Diagnosis not present

## 2019-03-19 DIAGNOSIS — E559 Vitamin D deficiency, unspecified: Secondary | ICD-10-CM

## 2019-03-19 DIAGNOSIS — E785 Hyperlipidemia, unspecified: Secondary | ICD-10-CM

## 2019-03-19 MED ORDER — ATORVASTATIN CALCIUM 40 MG PO TABS: 40.0000 mg | ORAL_TABLET | Freq: Every day | ORAL | 1 refills | Status: DC

## 2019-03-19 MED ORDER — ATENOLOL 50 MG PO TABS: 50.0000 mg | ORAL_TABLET | Freq: Every day | ORAL | 1 refills | Status: DC

## 2019-03-19 NOTE — Progress Notes (Signed)
Name: Bethany Bowman   MRN: [id number]    DOB: [birth year]   Date:03/19/2019       Progress Note  Subjective  Chief Complaint  Chief Complaint  Patient presents with  . Follow-up  . Hypertension  . Hyperlipidemia    I connected with  Donna Christen on 03/19/19 at  9:40 AM EDT by telephone and verified that I am speaking with the correct person using two identifiers.  I discussed the limitations, risks, security and privacy concerns of performing an evaluation and management service by telephone and the availability of in person appointments. Staff also discussed with the patient that there may be a patient responsible charge related to this service. Patient Location: Home Provider Location: Home Additional Individuals present: None  HPI  Hypertension: patient is rx atenolol 50mg  daily, lisinopril 20mg  and HCTZ 25mg  daily. Takes 31meq of potassium daily due to chronic hypokalemia due to diuretic use per patient.  Denies chest pain, shortness of breath, BLE edema, lightheadedness/dizziness, no headaches.  Hyperlipidemia: patient takes atorvastatin 40mg  at night; no chest pain, shortness of breath, or myalgias. On aspirin 81mg  daily.  Vitamin D Deficiency: Had recent bone density scan - normal.  Labs have been consistently normal.   Obesity: She walks about 15-94min four times a week.  She is eating a balanced diet - she changed her lifestyle in February 2020 and lost about 30lbs since then. Cut out fried foods, sweets, and breads.   Patient Active Problem List   Diagnosis Date Noted  . Restless legs 09/12/2017  . Obesity (BMI 35.0-39.9 without comorbidity) 03/15/2017  . History of colonoscopy with polypectomy 03/15/2017  . Hyperlipidemia LDL goal <100 02/18/2016  . Encounter for medication monitoring 02/18/2016  . Anxiety 11/25/2014  . Screening for depression 11/25/2014  . Hypertriglyceridemia 11/25/2014  . Cannot sleep 11/25/2014  . Dysmetabolic syndrome 99991111  .  Benign essential HTN 07/01/2007    Past Surgical History:  Procedure Laterality Date  . COLONOSCOPY WITH PROPOFOL N/A 12/28/2016   Procedure: COLONOSCOPY WITH PROPOFOL;  Surgeon: Jonathon Bellows, MD;  Location: [facility] ENDOSCOPY;  Service: Endoscopy;  Laterality: N/A;  . COLONOSCOPY WITH PROPOFOL N/A 01/15/2018   Procedure: COLONOSCOPY WITH PROPOFOL;  Surgeon: Jonathon Bellows, MD;  Location: [facility] ENDOSCOPY;  Service: Gastroenterology;  Laterality: N/A;    Family History  Problem Relation Age of Onset  . Colon cancer Mother   . Hypertension Mother   . Alcohol abuse Father   . Hypertension Father   . Hypertension Sister   . Hypertension Brother   . Prostate cancer Brother   . Bone cancer Brother   . Lung cancer Brother   . Hypertension Brother   . Hypertension Brother   . Breast cancer Neg Hx     Social History   Socioeconomic History  . Marital status: Married    Spouse name: Lavera Guise  . Number of children: 2  . Years of education: some college  . Highest education level: 12th grade  Occupational History  . Not on file  Social Needs  . Financial resource strain: Not hard at all  . Food insecurity    Worry: Never true    Inability: Never true  . Transportation needs    Medical: No    Non-medical: No  Tobacco Use  . Smoking status: Former Smoker    Packs/day: 0.50    Years: 12.00    Pack years: 6.00    Types: Cigarettes    Quit date: 08/03/1981  Years since quitting: 37.6  . Smokeless tobacco: Never Used  . Tobacco comment: smoking cessation materials not required  Substance and Sexual Activity  . Alcohol use: No    Alcohol/week: 0.0 standard drinks  . Drug use: No  . Sexual activity: Not Currently  Lifestyle  . Physical activity    Days per week: 2 days    Minutes per session: 30 min  . Stress: Only a little  Relationships  . Social connections    Talks on phone: More than three times a week    Gets together: More than three times a week    Attends religious  service: More than 4 times per year    Active member of club or organization: No    Attends meetings of clubs or organizations: Never    Relationship status: Married  . Intimate partner violence    Fear of current or ex partner: No    Emotionally abused: No    Physically abused: No    Forced sexual activity: No  Other Topics Concern  . Not on file  Social History Narrative  . Not on file     Current Outpatient Medications:  .  aspirin EC 81 MG tablet, Take 1 tablet (81 mg total) by mouth daily., Disp: , Rfl:  .  atenolol (TENORMIN) 50 MG tablet, TAKE 1 TABLET EVERY DAY, Disp: 90 tablet, Rfl: 0 .  atorvastatin (LIPITOR) 40 MG tablet, TAKE 1 TABLET EVERY DAY, Disp: 90 tablet, Rfl: 0 .  cholecalciferol (VITAMIN D) 1000 units tablet, Take 1,000 Units by mouth daily., Disp: , Rfl:  .  fluticasone (FLONASE) 50 MCG/ACT nasal spray, USE 2 SPRAYS IN EACH NOSTRIL EVERY DAY, Disp: 48 g, Rfl: 1 .  lisinopril-hydrochlorothiazide (ZESTORETIC) 20-25 MG tablet, TAKE 1 TABLET EVERY DAY, Disp: 90 tablet, Rfl: 1 .  potassium chloride (K-DUR) 10 MEQ tablet, TAKE 1 TABLET EVERY DAY, Disp: 90 tablet, Rfl: 1  No Known Allergies  I personally reviewed active problem list, medication list, notes from last encounter, lab results with the patient/caregiver today.   ROS  Constitutional: Negative for fever or unintentional weight change.  Respiratory: Negative for cough and shortness of breath.   Cardiovascular: Negative for chest pain or palpitations.  Gastrointestinal: Negative for abdominal pain, no bowel changes.  Musculoskeletal: Negative for gait problem or joint swelling.  Skin: Negative for rash.  Neurological: Negative for dizziness or headache.  No other specific complaints in a complete review of systems (except as listed in HPI above).  Objective  Virtual encounter, vitals not obtained.  There is no height or weight on file to calculate BMI.  Physical Exam  Pulmonary/Chest: Effort  normal. No respiratory distress. Speaking in complete sentences Neurological: Pt is alert and oriented to person, place, and time. Speech is normal Psychiatric: Patient has a normal mood and affect. behavior is normal. Judgment and thought content normal.  No results found for this or any previous visit (from the past 72 hour(s)).  PHQ2/9: Depression screen [facility] 2/9 03/19/2019 12/17/2018 03/15/2018 12/13/2017 09/12/2017  Decreased Interest 0 0 0 0 0  Down, Depressed, Hopeless 0 0 0 0 0  PHQ - 2 Score 0 0 0 0 0  Altered sleeping 0 - - 0 -  Tired, decreased energy 0 - - 0 -  Change in appetite 0 - - 0 -  Feeling bad or failure about yourself  0 - - 0 -  Trouble concentrating 0 - - 0 -  Moving slowly or  fidgety/restless 0 - - 0 -  Suicidal thoughts 0 - - 0 -  PHQ-9 Score 0 - - 0 -  Difficult doing work/chores Not difficult at all - - Not difficult at all -   PHQ-2/9 Result is negative.    Fall Risk: Fall Risk  03/19/2019 12/17/2018 03/15/2018 12/13/2017 09/12/2017  Falls in the past year? 0 0 No No No  Number falls in past yr: 0 0 - - -  Injury with Fall? 0 0 - - -  Risk for fall due to : - - - Impaired vision -  Risk for fall due to: Comment - - - wears eyeglasses -  Follow up - Falls prevention discussed - - -    Assessment & Plan  1. Benign essential HTN - Will come in for flu shot and vital signs to check BP; otherwise doing very well - atenolol (TENORMIN) 50 MG tablet; Take 1 tablet (50 mg total) by mouth daily.  Dispense: 90 tablet; Refill: 1 - COMPLETE METABOLIC PANEL WITH GFR  2. Hyperlipidemia LDL goal <100 - Doing well on current regimen - atorvastatin (LIPITOR) 40 MG tablet; Take 1 tablet (40 mg total) by mouth daily.  Dispense: 90 tablet; Refill: 1 - Lipid panel  3. Vitamin D deficiency - Taking supplement - advised to continue to prevent osteoporosis. - VITAMIN D 25 Hydroxy (Vit-D Deficiency, Fractures)  4. Obesity (BMI 35.0-39.9 without comorbidity) - Doing great  with healthy diet and routine exercise. - Discussed importance of 150 minutes of physical activity weekly, eat two servings of fish weekly, eat one serving of tree nuts ( cashews, pistachios, pecans, almonds.Marland Kitchen) every other day, eat 6 servings of fruit/vegetables daily and drink plenty of water and avoid sweet beverages.  - Lipid panel - COMPLETE METABOLIC PANEL WITH GFR   I discussed the assessment and treatment plan with the patient. The patient was provided an opportunity to ask questions and all were answered. The patient agreed with the plan and demonstrated an understanding of the instructions.   The patient was advised to call back or seek an in-person evaluation if the symptoms worsen or if the condition fails to improve as anticipated.  I provided 20 minutes of non-face-to-face time during this encounter.  Hubbard Hartshorn, FNP

## 2019-05-09 ENCOUNTER — Other Ambulatory Visit: Payer: Self-pay

## 2019-05-09 DIAGNOSIS — J0140 Acute pansinusitis, unspecified: Secondary | ICD-10-CM

## 2019-05-09 DIAGNOSIS — J302 Other seasonal allergic rhinitis: Secondary | ICD-10-CM

## 2019-05-09 MED ORDER — FLUTICASONE PROPIONATE 50 MCG/ACT NA SUSP: 2.0000 | Freq: Every day | NASAL | 1 refills | Status: DC

## 2019-05-09 NOTE — Telephone Encounter (Signed)
Tried contacting pt no answer, please schedule appt. Refill was sent in but need appt for additional refills

## 2019-05-09 NOTE — Telephone Encounter (Signed)
Refill request for general medication. Flonase  Last office visit 03/19/2019   No follow-ups on file.

## 2019-05-13 ENCOUNTER — Ambulatory Visit: Payer: Self-pay

## 2019-05-13 NOTE — Telephone Encounter (Signed)
  Pt c/o productive cough and low grade fever to 99.9 started Sunday with fever yesterday to 99. Pt also c/o loss of appetite. Today pt is not running fever. Pt with productive cough with yellow phlegm in the morning that turns clear as the day goes on.  Pt stated that she has these same symptoms twice a year in the Spring and Fall- pt stated she gets abx and cough medicine and it helps her.  Pt stated she was tested for covid last week and was negative. No other sx. No appts available at PCP so advised pt to go to [facility].  Advised pt to continue monitoring her symptoms. Drink warm fluids and suck on cough drps. Advised pt to eat a bland diet and work her way up to regular diet. Advised pt to call 911 for SOB or respiratory difficulty or if unable to keep fluids down. Pt verbalized understanding. Called Ronda at office and no appts available all week.       Reason for Disposition . [1] HIGH RISK patient (e.g., age > 64 years, diabetes, heart or lung disease, weak immune system) AND [2] new or worsening symptoms  Answer Assessment - Initial Assessment Questions 1. COVID-19 DIAGNOSIS: "Who made your Coronavirus (COVID-19) diagnosis?" "Was it confirmed by a positive lab test?" If not diagnosed by a HCP, ask "Are there lots of cases (community spread) where you live?" (See public health department website, if unsure)     n/a 2. COVID-19 EXPOSURE: "Was there any known exposure to COVID before the symptoms began?" CDC Definition of close contact: within 6 feet (2 meters) for a total of 15 minutes or more over a 24-hour period.     no 3. ONSET: "When did the COVID-19 symptoms start?"      11 /22/20 raked leaves had similar symptoms 4. WORST SYMPTOM: "What is your worst symptom?" (e.g., cough, fever, shortness of breath, muscle aches)  cough 5. COUGH: "Do you have a cough?" If so, ask: "How bad is the cough?"       Yes-productive-yellow first in the am then clears up as the day goes on 6. FEVER: "Do you  have a fever?" If so, ask: "What is your temperature, how was it measured, and when did it start?"     no 7. RESPIRATORY STATUS: "Describe your breathing?" (e.g., shortness of breath, wheezing, unable to speak)      no 8. BETTER-SAME-WORSE: "Are you getting better, staying the same or getting worse compared to yesterday?"  If getting worse, ask, "In what way?"     same 9. HIGH RISK DISEASE: "Do you have any chronic medical problems?" (e.g., asthma, heart or lung disease, weak immune system, obesity, etc.)     HTN, obesity 10. PREGNANCY: "Is there any chance you are pregnant?" "When was your last menstrual period?"       n/a 11. OTHER SYMPTOMS: "Do you have any other symptoms?"  (e.g., chills, fatigue, headache, loss of smell or taste, muscle pain, sore throat; new loss of smell or taste especially support the diagnosis of COVID-19)       Loss of appetite, cough phlegm, Sunday 99.9 no higher than 97 yesterday and today.  Protocols used: CORONAVIRUS (COVID-19) DIAGNOSED OR SUSPECTED-A-AH

## 2019-05-14 NOTE — Telephone Encounter (Signed)
Pt states feels better today.  I did recommend her not being around anyone tomorrow due to those being covid symptoms.  I did offer her a virtual appointment.  She will call back to schedule if not better by end of week .

## 2019-05-30 ENCOUNTER — Telehealth: Payer: Self-pay

## 2019-05-30 DIAGNOSIS — I10 Essential (primary) hypertension: Secondary | ICD-10-CM

## 2019-05-30 MED ORDER — LISINOPRIL-HYDROCHLOROTHIAZIDE 20-25 MG PO TABS: 1.0000 | ORAL_TABLET | Freq: Every day | ORAL | 0 refills | Status: DC

## 2019-06-02 NOTE — Telephone Encounter (Signed)
Pt is scheduled °

## 2019-06-11 ENCOUNTER — Other Ambulatory Visit: Payer: Self-pay | Admitting: Emergency Medicine

## 2019-06-11 DIAGNOSIS — E876 Hypokalemia: Secondary | ICD-10-CM

## 2019-06-11 DIAGNOSIS — T502X5A Adverse effect of carbonic-anhydrase inhibitors, benzothiadiazides and other diuretics, initial encounter: Secondary | ICD-10-CM

## 2019-06-11 MED ORDER — POTASSIUM CHLORIDE CRYS ER 10 MEQ PO TBCR: 10.0000 meq | EXTENDED_RELEASE_TABLET | Freq: Every day | ORAL | 0 refills | Status: DC

## 2019-07-02 ENCOUNTER — Ambulatory Visit (INDEPENDENT_AMBULATORY_CARE_PROVIDER_SITE_OTHER): Payer: Medicare HMO | Admitting: Family Medicine

## 2019-07-02 ENCOUNTER — Other Ambulatory Visit: Payer: Self-pay

## 2019-07-02 ENCOUNTER — Encounter: Payer: Self-pay | Admitting: Family Medicine

## 2019-07-02 VITALS — BP 150/94 | HR 63 | Temp 97.6°F | Resp 14 | Ht 69.0 in | Wt 226.9 lb

## 2019-07-02 DIAGNOSIS — E785 Hyperlipidemia, unspecified: Secondary | ICD-10-CM | POA: Diagnosis not present

## 2019-07-02 DIAGNOSIS — Z5181 Encounter for therapeutic drug level monitoring: Secondary | ICD-10-CM | POA: Diagnosis not present

## 2019-07-02 DIAGNOSIS — I10 Essential (primary) hypertension: Secondary | ICD-10-CM

## 2019-07-02 DIAGNOSIS — G2581 Restless legs syndrome: Secondary | ICD-10-CM

## 2019-07-02 DIAGNOSIS — E559 Vitamin D deficiency, unspecified: Secondary | ICD-10-CM

## 2019-07-02 DIAGNOSIS — E876 Hypokalemia: Secondary | ICD-10-CM

## 2019-07-02 LAB — LIPID PANEL
Cholesterol: 163 mg/dL (ref <200)
HDL: 51 mg/dL (ref >=50)
LDL Cholesterol (Calc): 94 mg/dL (calc)
Non-HDL Cholesterol (Calc): 112 mg/dL (calc) (ref <130)
Total CHOL/HDL Ratio: 3.2 (calc) (ref <5.0)
Triglycerides: 86 mg/dL (ref <150)

## 2019-07-02 LAB — COMPLETE METABOLIC PANEL WITH GFR
AG Ratio: 1.4 (calc) (ref 1.0–2.5)
ALT: 17 U/L (ref 6–29)
AST: 18 U/L (ref 10–35)
Albumin: 4.2 g/dL (ref 3.6–5.1)
Alkaline phosphatase (APISO): 45 U/L (ref 37–153)
BUN: 21 mg/dL (ref 7–25)
CO2: 32 mmol/L (ref 20–32)
Calcium: 10.4 mg/dL (ref 8.6–10.4)
Chloride: 103 mmol/L (ref 98–110)
Creat: 0.87 mg/dL (ref 0.60–0.93)
GFR, Est African American: 78 mL/min/{1.73_m2} (ref >=60)
GFR, Est Non African American: 67 mL/min/{1.73_m2} (ref >=60)
Globulin: 3 g/dL (calc) (ref 1.9–3.7)
Glucose, Bld: 99 mg/dL (ref 65–99)
Potassium: 3.6 mmol/L (ref 3.5–5.3)
Sodium: 142 mmol/L (ref 135–146)
Total Bilirubin: 0.7 mg/dL (ref 0.2–1.2)
Total Protein: 7.2 g/dL (ref 6.1–8.1)

## 2019-07-02 LAB — CBC WITH DIFFERENTIAL/PLATELET
Absolute Monocytes: 439 cells/uL (ref 200–950)
Basophils Absolute: 61 cells/uL (ref 0–200)
Basophils Relative: 1.2 %
Eosinophils Absolute: 230 cells/uL (ref 15–500)
Eosinophils Relative: 4.5 %
HCT: 40.9 % (ref 35.0–45.0)
Hemoglobin: 13.5 g/dL (ref 11.7–15.5)
Lymphs Abs: 2275 cells/uL (ref 850–3900)
MCH: 27.1 pg (ref 27.0–33.0)
MCHC: 33 g/dL (ref 32.0–36.0)
MCV: 82.1 fL (ref 80.0–100.0)
MPV: 9.6 fL (ref 7.5–12.5)
Monocytes Relative: 8.6 %
Neutro Abs: 2096 cells/uL (ref 1500–7800)
Neutrophils Relative %: 41.1 %
Platelets: 257 10*3/uL (ref 140–400)
RBC: 4.98 10*6/uL (ref 3.80–5.10)
RDW: 14.6 % (ref 11.0–15.0)
Total Lymphocyte: 44.6 %
WBC: 5.1 10*3/uL (ref 3.8–10.8)

## 2019-07-02 LAB — VITAMIN D 25 HYDROXY (VIT D DEFICIENCY, FRACTURES): Vit D, 25-Hydroxy: 42 ng/mL (ref 30–100)

## 2019-07-02 NOTE — Progress Notes (Signed)
Name: Bethany Bowman   MRN: [id number]    DOB: 10-03-1950   Date:07/02/2019       Progress Note  Chief Complaint  Patient presents with  . Follow-up  . Medication Refill    She is unsure which medciations she needs refilled  . Hypertension  . Hyperlipidemia     Subjective:   [name] is a 69 y.o. female, presents to clinic for routine follow-up and med refill, she is new to me, last seen by my colleagues about 3 to 4 months ago for routine conditions, last labs were done on 03/19/2019  She presents with elevated blood pressure today, initially 150/90, then 140/96 with repeat BP  Hypertension:  Currently managed on atenolol and lisinopril HCTZ 20-25 Pt reports med compliance and denies any SE.  No lightheadedness, hypotension, syncope. Blood pressure today is uncontrolled and unstable will monitor and adjust medication.  She states that she gets very nervous at the doctor's office and that she tends to have high blood pressure.  I reviewed flow sheets over several years of office visits from what was available in the EMR her blood pressure does tend to vary from well-controlled to uncontrolled 120s to 160s SBP BP Readings from Last 3 Encounters:  07/02/19 (!) 150/94  03/15/18 130/76  01/15/18 140/84   Pt denies CP, SOB, exertional sx, LE edema, palpitation, Ha's, visual disturbances  Hyperlipidemia: Current Medication Regimen:   Lipitor 40 mg she is compliant no missed doses Last Lipids: Lab Results  Component Value Date   CHOL 185 03/15/2018   HDL 45 (L) 03/15/2018   LDLCALC 117 (H) 03/15/2018   TRIG 120 03/15/2018   CHOLHDL 4.1 03/15/2018   - Current Diet: Significant diet lifestyle improvement see below - Denies: Chest pain, shortness of breath, myalgias. - Documented aortic atherosclerosis? No - Risk factors for atherosclerosis: hypercholesterolemia and hypertension  Cut out all fried food, cut out bread and sweets, was trying to walk several times a week, hasn't  been able to do that lately, not since Christmas   RLS have resolved    Patient Active Problem List   Diagnosis Date Noted  . Restless legs 09/12/2017  . Obesity (BMI 35.0-39.9 without comorbidity) 03/15/2017  . History of colonoscopy with polypectomy 03/15/2017  . Hyperlipidemia LDL goal <100 02/18/2016  . Encounter for medication monitoring 02/18/2016  . Screening for depression 11/25/2014  . Hypertriglyceridemia 11/25/2014  . Insomnia 11/25/2014  . Dysmetabolic syndrome 99991111  . Benign essential HTN 07/01/2007    Past Surgical History:  Procedure Laterality Date  . COLONOSCOPY WITH PROPOFOL N/A 12/28/2016   Procedure: COLONOSCOPY WITH PROPOFOL;  Surgeon: Jonathon Bellows, MD;  Location[facility] ENDOSCOPY;  Service: Endoscopy;  Laterality: N/A;  . COLONOSCOPY WITH PROPOFOL N/A 01/15/2018   Procedure: COLONOSCOPY WITH PROPOFOL;  Surgeon: Jonathon Bellows, MD;  Location: [facility] ENDOSCOPY;  Service: Gastroenterology;  Laterality: N/A;    Family History  Problem Relation Age of Onset  . Colon cancer Mother   . Hypertension Mother   . Alcohol abuse Father   . Hypertension Father   . Hypertension Sister   . Hypertension Brother   . Prostate cancer Brother   . Bone cancer Brother   . Lung cancer Brother   . Hypertension Brother   . Hypertension Brother   . Breast cancer Neg Hx     Social History   Socioeconomic History  . Marital status: Married    Spouse name: Lavera Guise  . Number of children: 2  .  Years of education: some college  . Highest education level: 12th grade  Occupational History  . Not on file  Tobacco Use  . Smoking status: Former Smoker    Packs/day: 0.50    Years: 12.00    Pack years: 6.00    Types: Cigarettes    Quit date: 08/03/1981    Years since quitting: 37.9  . Smokeless tobacco: Never Used  . Tobacco comment: smoking cessation materials not required  Substance and Sexual Activity  . Alcohol use: No    Alcohol/week: 0.0 standard drinks  . Drug use:  No  . Sexual activity: Not Currently  Other Topics Concern  . Not on file  Social History Narrative  . Not on file   Social Determinants of Health   Financial Resource Strain:   . Difficulty of Paying Living Expenses: Not on file  Food Insecurity:   . Worried About Charity fundraiser in the Last Year: Not on file  . Ran Out of Food in the Last Year: Not on file  Transportation Needs:   . Lack of Transportation (Medical): Not on file  . Lack of Transportation (Non-Medical): Not on file  Physical Activity: Insufficiently Active  . Days of Exercise per Week: 2 days  . Minutes of Exercise per Session: 30 min  Stress: No Stress Concern Present  . Feeling of Stress : Only a little  Social Connections: Unknown  . Frequency of Communication with Friends and Family: More than three times a week  . Frequency of Social Gatherings with Friends and Family: More than three times a week  . Attends Religious Services: More than 4 times per year  . Active Member of Clubs or Organizations: No  . Attends Archivist Meetings: Never  . Marital Status: Not on file  Intimate Partner Violence:   . Fear of Current or Ex-Partner: Not on file  . Emotionally Abused: Not on file  . Physically Abused: Not on file  . Sexually Abused: Not on file     Current Outpatient Medications:  .  aspirin EC 81 MG tablet, Take 1 tablet (81 mg total) by mouth daily., Disp: , Rfl:  .  atenolol (TENORMIN) 50 MG tablet, Take 1 tablet (50 mg total) by mouth daily., Disp: 90 tablet, Rfl: 1 .  atorvastatin (LIPITOR) 40 MG tablet, Take 1 tablet (40 mg total) by mouth daily., Disp: 90 tablet, Rfl: 1 .  cholecalciferol (VITAMIN D) 1000 units tablet, Take 1,000 Units by mouth daily., Disp: , Rfl:  .  fluticasone (FLONASE) 50 MCG/ACT nasal spray, Place 2 sprays into both nostrils daily., Disp: 48 g, Rfl: 1 .  lisinopril-hydrochlorothiazide (ZESTORETIC) 20-25 MG tablet, Take 1 tablet by mouth daily., Disp: 30 tablet,  Rfl: 0 .  potassium chloride (KLOR-CON) 10 MEQ tablet, Take 1 tablet (10 mEq total) by mouth daily., Disp: 30 tablet, Rfl: 0 .  FLUZONE HIGH-DOSE QUADRIVALENT 0.7 ML SUSY, , Disp: , Rfl:   No Known Allergies  Chart Review Today: I personally reviewed active problem list, medication list, allergies, family history, social history, health maintenance, notes from last encounter, lab results, imaging with the patient/caregiver today. Last labs and encounters reviewed today, flowsheet of average vital signs over the past several years reviewed today  Review of Systems  Constitutional: Negative.   HENT: Negative.   Eyes: Negative.   Respiratory: Negative.   Cardiovascular: Negative.   Gastrointestinal: Negative.   Endocrine: Negative.   Genitourinary: Negative.   Musculoskeletal: Negative.  Skin: Negative.   Allergic/Immunologic: Negative.   Neurological: Negative.   Hematological: Negative.   Psychiatric/Behavioral: Negative.   All other systems reviewed and are negative.    Objective:    Vitals:   07/02/19 0807  BP: (!) 150/94  Pulse: 63  Resp: 14  Temp: 97.6 F (36.4 C)  TempSrc: Temporal  SpO2: 96%  Weight: 226 lb 14.4 oz (102.9 kg)  Height: 5\' 9"  (1.753 m)    Body mass index is 33.51 kg/m.  Physical Exam Vitals and nursing note reviewed.  Constitutional:      General: She is not in acute distress.    Appearance: Normal appearance. She is well-developed. She is obese. She is not ill-appearing, toxic-appearing or diaphoretic.     Interventions: Face mask in place.     Comments: Pleasant well appearing female   HENT:     Head: Normocephalic and atraumatic.     Right Ear: External ear normal.     Left Ear: External ear normal.  Eyes:     General: Lids are normal. No scleral icterus.       Right eye: No discharge.        Left eye: No discharge.     Conjunctiva/sclera: Conjunctivae normal.  Neck:     Trachea: Phonation normal. No tracheal deviation.   Cardiovascular:     Rate and Rhythm: Normal rate and regular rhythm.     Pulses: Normal pulses.          Radial pulses are 2+ on the right side and 2+ on the left side.       Posterior tibial pulses are 2+ on the right side and 2+ on the left side.     Heart sounds: Normal heart sounds. No murmur. No friction rub. No gallop.   Pulmonary:     Effort: Pulmonary effort is normal. No respiratory distress.     Breath sounds: Normal breath sounds. No stridor. No wheezing, rhonchi or rales.  Chest:     Chest wall: No tenderness.  Abdominal:     General: Bowel sounds are normal. There is no distension.     Palpations: Abdomen is soft.     Tenderness: There is no abdominal tenderness. There is no guarding or rebound.  Musculoskeletal:        General: No deformity. Normal range of motion.     Cervical back: Normal range of motion and neck supple.     Right lower leg: No edema.     Left lower leg: No edema.  Lymphadenopathy:     Cervical: No cervical adenopathy.  Skin:    General: Skin is warm and dry.     Capillary Refill: Capillary refill takes less than 2 seconds.     Coloration: Skin is not jaundiced or pale.     Findings: No rash.  Neurological:     Mental Status: She is alert and oriented to person, place, and time.     Motor: No abnormal muscle tone.     Gait: Gait normal.  Psychiatric:        Mood and Affect: Mood normal.        Speech: Speech normal.        Behavior: Behavior normal.        PHQ2/9: Depression screen [facility] 2/9 07/02/2019 03/19/2019 12/17/2018 03/15/2018 12/13/2017  Decreased Interest 0 0 0 0 0  Down, Depressed, Hopeless 0 0 0 0 0  PHQ - 2 Score 0 0 0 0 0  Altered  sleeping 1 0 - - 0  Tired, decreased energy 1 0 - - 0  Change in appetite 0 0 - - 0  Feeling bad or failure about yourself  0 0 - - 0  Trouble concentrating 0 0 - - 0  Moving slowly or fidgety/restless 0 0 - - 0  Suicidal thoughts 0 0 - - 0  PHQ-9 Score 2 0 - - 0  Difficult doing work/chores Not  difficult at all Not difficult at all - - Not difficult at all    phq 9 is reviewed, neg, score less than 4, sleeping more poorly since she started working on healthy diet.  Fall Risk: Fall Risk  07/02/2019 03/19/2019 12/17/2018 03/15/2018 12/13/2017  Falls in the past year? 1 0 0 No No  Number falls in past yr: 0 0 0 - -  Injury with Fall? 0 0 0 - -  Risk for fall due to : - - - - Impaired vision  Risk for fall due to: Comment - - - - wears eyeglasses  Follow up - - Falls prevention discussed - -    Functional Status Survey: Is the patient deaf or have difficulty hearing?: No Does the patient have difficulty seeing, even when wearing glasses/contacts?: No Does the patient have difficulty concentrating, remembering, or making decisions?: No Does the patient have difficulty walking or climbing stairs?: No Does the patient have difficulty dressing or bathing?: No Does the patient have difficulty doing errands alone such as visiting a doctor's office or shopping?: No   Assessment & Plan:     ICD-10-CM   1. Benign essential HTN  I10 CMP w GFR   uncontrolled - BP elevated today, check labs, monitor BP at home and f/up in 2 weeks, if average is high will adjust meds, if home BP average at goal no change  2. Hyperlipidemia LDL goal <100  E78.5 CMP w GFR    Lipid Panel   Compliant with meds, no myalgias, recheck labs, discussed statin benefits and ASCVD risk and overall lifestyle changes that decrease cardiovascular risk  3. Vitamin D deficiency  E55.9 Vit D   Not on any supplements, recheck vitamin D deficiency  4. Restless legs  G25.81    Has been improved since her diet improved, not on medications to manage  5. Hypokalemia  E87.6 CMP w GFR   We will recheck potassium level with supplements and blood pressure medicines  6. Encounter for medication monitoring  Z51.81 CBC w/ Diff    CMP w GFR    Lipid Panel     Return for 2 week BP recheck .   Delsa Grana, PA-C 07/02/19 8:17 AM

## 2019-07-02 NOTE — Patient Instructions (Signed)
Blood Pressure Record Sheet To take your blood pressure, you will need a blood pressure machine. You can buy a blood pressure machine (blood pressure monitor) at your clinic, drug store, or online. When choosing one, consider:  An automatic monitor that has an arm cuff.  A cuff that wraps snugly around your upper arm. You should be able to fit only one finger between your arm and the cuff.  A device that stores blood pressure reading results.  Do not choose a monitor that measures your blood pressure from your wrist or finger. Follow your health care provider's instructions for how to take your blood pressure. To use this form:  Get one reading in the morning (a.m.) before you take any medicines.  Get one reading in the evening (p.m.) before supper.  Take at least 2 readings with each blood pressure check. This makes sure the results are correct. Wait 1-2 minutes between measurements.  Write down the results in the spaces on this form.  Repeat this once a week, or as told by your health care provider.  Make a follow-up appointment with your health care provider to discuss the results.  Blood pressure log Date: _______________________  a.m. _____________________(1st reading) _____________________(2nd reading)  p.m. _____________________(1st reading) _____________________(2nd reading) Date: _______________________  a.m. _____________________(1st reading) _____________________(2nd reading)  p.m. _____________________(1st reading) _____________________(2nd reading) Date: _______________________  a.m. _____________________(1st reading) _____________________(2nd reading)  p.m. _____________________(1st reading) _____________________(2nd reading) Date: _______________________  a.m. _____________________(1st reading) _____________________(2nd reading)  p.m. _____________________(1st reading) _____________________(2nd reading) Date: _______________________  a.m.  _____________________(1st reading) _____________________(2nd reading)  p.m. _____________________(1st reading) _____________________(2nd reading) Date: _______________________  a.m. _____________________(1st reading) _____________________(2nd reading)  p.m. _____________________(1st reading) _____________________(2nd reading) Date: _______________________  a.m. _____________________(1st reading) _____________________(2nd reading)  p.m. _____________________(1st reading) _____________________(2nd reading) Date: _______________________  a.m. _____________________(1st reading) _____________________(2nd reading)  p.m. _____________________(1st reading) _____________________(2nd reading) Date: _______________________  a.m. _____________________(1st reading) _____________________(2nd reading)  p.m. _____________________(1st reading) _____________________(2nd reading) Date: _______________________  a.m. _____________________(1st reading) _____________________(2nd reading)  p.m. _____________________(1st reading) _____________________(2nd reading) This information is not intended to replace advice given to you by your health care provider. Make sure you discuss any questions you have with your health care provider. Document Revised: 08/03/2017 Document Reviewed: 06/05/2017 Elsevier Patient Education  [place].

## 2019-07-07 ENCOUNTER — Other Ambulatory Visit: Payer: Self-pay | Admitting: Family Medicine

## 2019-07-07 DIAGNOSIS — T502X5A Adverse effect of carbonic-anhydrase inhibitors, benzothiadiazides and other diuretics, initial encounter: Secondary | ICD-10-CM

## 2019-07-07 DIAGNOSIS — E876 Hypokalemia: Secondary | ICD-10-CM

## 2019-07-16 ENCOUNTER — Other Ambulatory Visit: Payer: Self-pay

## 2019-07-16 ENCOUNTER — Ambulatory Visit: Payer: Medicare HMO

## 2019-07-16 VITALS — BP 142/90 | HR 82

## 2019-07-16 DIAGNOSIS — I10 Essential (primary) hypertension: Secondary | ICD-10-CM

## 2019-08-13 ENCOUNTER — Other Ambulatory Visit: Payer: Self-pay | Admitting: Family Medicine

## 2019-08-13 DIAGNOSIS — I10 Essential (primary) hypertension: Secondary | ICD-10-CM

## 2019-08-13 DIAGNOSIS — E785 Hyperlipidemia, unspecified: Secondary | ICD-10-CM

## 2019-09-22 ENCOUNTER — Other Ambulatory Visit: Payer: Self-pay | Admitting: Family Medicine

## 2019-09-22 DIAGNOSIS — J0140 Acute pansinusitis, unspecified: Secondary | ICD-10-CM

## 2019-09-22 DIAGNOSIS — J302 Other seasonal allergic rhinitis: Secondary | ICD-10-CM

## 2019-10-14 ENCOUNTER — Other Ambulatory Visit: Payer: Self-pay | Admitting: Family Medicine

## 2019-10-14 DIAGNOSIS — I10 Essential (primary) hypertension: Secondary | ICD-10-CM

## 2019-11-12 ENCOUNTER — Telehealth: Payer: Self-pay | Admitting: Family Medicine

## 2019-11-12 ENCOUNTER — Other Ambulatory Visit: Payer: Self-pay | Admitting: Family Medicine

## 2019-11-12 DIAGNOSIS — E876 Hypokalemia: Secondary | ICD-10-CM

## 2019-11-12 NOTE — Chronic Care Management (AMB) (Signed)
  Chronic Care Management   Outreach Note  11/12/2019 Name: Bethany Bowman MRN: [id number] DOB: May 31, 1951  Bethany Bowman is a 69 y.o. year old female who is a primary care patient of Delsa Grana, [place]. I reached out to Donna Christen by phone today in response to a referral sent by [name]'s health plan.     An unsuccessful telephone outreach was attempted today. The patient was referred to the case management team for assistance with care management and care coordination.   Follow Up Plan: A HIPPA compliant phone message was left for the patient providing contact information and requesting a return call.  The care management team will reach out to the patient again over the next 7 days.  If patient returns call to provider office, please advise to call [facility] at [place], [place], [place], [place] [address] Direct Dial: [phone] [email or web address] Website: [email or web address]

## 2019-11-18 ENCOUNTER — Telehealth: Payer: Self-pay | Admitting: Family Medicine

## 2019-11-18 NOTE — Chronic Care Management (AMB) (Signed)
  Chronic Care Management   Note  11/18/2019 Name: Bethany Bowman MRN: [id number] DOB: 04-Aug-1950  Bethany Bowman is a 69 y.o. year old female who is a primary care patient of Delsa Grana, [place]. I reached out to Donna Christen by phone today in response to a referral sent by [name]'s health plan.     [name] was given information about Chronic Care Management services today including:  1. CCM service includes personalized support from designated clinical staff supervised by her physician, including individualized plan of care and coordination with other care providers 2. 24/7 contact phone numbers for assistance for urgent and routine care needs. 3. Service will only be billed when office clinical staff spend 20 minutes or more in a month to coordinate care. 4. Only one practitioner may furnish and bill the service in a calendar month. 5. The patient may stop CCM services at any time (effective at the end of the month) by phone call to the office staff. 6. The patient will be responsible for cost sharing (co-pay) of up to 20% of the service fee (after annual deductible is met).  Patient did not agree to enrollment in care management services and does not wish to consider at this time.  Follow up plan: The patient has been provided with contact information for the care management team and has been advised to call with any health related questions or concerns.   Noreene Larsson, [place], [place], [place] [address] Direct Dial: [phone] [name]: [email or web address]

## 2019-11-18 NOTE — Telephone Encounter (Signed)
Left message for patient to schedule Annual Wellness Visit.  Please schedule with Nurse Health Advisor KASEY UTHUS, RN at [facility].   ° °

## 2019-12-13 DIAGNOSIS — Z0100 Encounter for examination of eyes and vision without abnormal findings: Secondary | ICD-10-CM | POA: Diagnosis not present

## 2019-12-13 DIAGNOSIS — H524 Presbyopia: Secondary | ICD-10-CM | POA: Diagnosis not present

## 2020-01-08 ENCOUNTER — Encounter: Payer: Self-pay | Admitting: Family Medicine

## 2020-01-08 ENCOUNTER — Other Ambulatory Visit: Payer: Self-pay

## 2020-01-08 ENCOUNTER — Ambulatory Visit (INDEPENDENT_AMBULATORY_CARE_PROVIDER_SITE_OTHER): Payer: Medicare HMO | Admitting: Family Medicine

## 2020-01-08 VITALS — BP 128/84 | HR 74 | Temp 97.9°F | Resp 18 | Ht 69.0 in | Wt 251.0 lb

## 2020-01-08 DIAGNOSIS — E669 Obesity, unspecified: Secondary | ICD-10-CM | POA: Diagnosis not present

## 2020-01-08 DIAGNOSIS — G2581 Restless legs syndrome: Secondary | ICD-10-CM | POA: Diagnosis not present

## 2020-01-08 DIAGNOSIS — J324 Chronic pansinusitis: Secondary | ICD-10-CM

## 2020-01-08 DIAGNOSIS — Z1231 Encounter for screening mammogram for malignant neoplasm of breast: Secondary | ICD-10-CM

## 2020-01-08 DIAGNOSIS — I10 Essential (primary) hypertension: Secondary | ICD-10-CM

## 2020-01-08 DIAGNOSIS — E876 Hypokalemia: Secondary | ICD-10-CM

## 2020-01-08 DIAGNOSIS — Z5181 Encounter for therapeutic drug level monitoring: Secondary | ICD-10-CM | POA: Diagnosis not present

## 2020-01-08 DIAGNOSIS — E785 Hyperlipidemia, unspecified: Secondary | ICD-10-CM | POA: Diagnosis not present

## 2020-01-08 DIAGNOSIS — Z7189 Other specified counseling: Secondary | ICD-10-CM

## 2020-01-08 DIAGNOSIS — Z7185 Encounter for immunization safety counseling: Secondary | ICD-10-CM

## 2020-01-08 LAB — COMPLETE METABOLIC PANEL WITH GFR
AG Ratio: 1.3 (calc) (ref 1.0–2.5)
ALT: 21 U/L (ref 6–29)
AST: 20 U/L (ref 10–35)
Albumin: 4.1 g/dL (ref 3.6–5.1)
Alkaline phosphatase (APISO): 55 U/L (ref 37–153)
BUN: 14 mg/dL (ref 7–25)
CO2: 32 mmol/L (ref 20–32)
Calcium: 10.3 mg/dL (ref 8.6–10.4)
Chloride: 101 mmol/L (ref 98–110)
Creat: 0.92 mg/dL (ref 0.60–0.93)
GFR, Est African American: 73 mL/min/{1.73_m2} (ref >=60)
GFR, Est Non African American: 63 mL/min/{1.73_m2} (ref >=60)
Globulin: 3.1 g/dL (calc) (ref 1.9–3.7)
Glucose, Bld: 95 mg/dL (ref 65–99)
Potassium: 4.1 mmol/L (ref 3.5–5.3)
Sodium: 142 mmol/L (ref 135–146)
Total Bilirubin: 0.7 mg/dL (ref 0.2–1.2)
Total Protein: 7.2 g/dL (ref 6.1–8.1)

## 2020-01-08 MED ORDER — ATORVASTATIN CALCIUM 40 MG PO TABS: 40.0000 mg | ORAL_TABLET | Freq: Every day | ORAL | 3 refills | Status: DC

## 2020-01-08 MED ORDER — AZELASTINE HCL 0.1 % NA SOLN: 2.0000 | Freq: Two times a day (BID) | NASAL | 12 refills | Status: DC

## 2020-01-08 MED ORDER — ATENOLOL 50 MG PO TABS: 50.0000 mg | ORAL_TABLET | Freq: Every day | ORAL | 3 refills | Status: DC

## 2020-01-08 MED ORDER — AMOXICILLIN-POT CLAVULANATE 875-125 MG PO TABS: 1.0000 | ORAL_TABLET | Freq: Two times a day (BID) | ORAL | 0 refills | Status: AC

## 2020-01-08 NOTE — Patient Instructions (Addendum)
Health Maintenance  Topic Date Due   COVID-19 Vaccine (1) Never done   PNA vac Low Risk Adult (2 of 2 - PPSV23) 04/02/2018   INFLUENZA VACCINE  01/18/2020   MAMMOGRAM  02/18/2020   COLONOSCOPY  01/15/2021   TETANUS/TDAP  08/27/2022   DEXA SCAN  Completed   Hepatitis C Screening  Completed   We recommend getting the COVID vaccination  It has been proven to be safe and effective - I recommend getting Pfizer or Moderna   COVID-19 Vaccine Information can be found at: ShippingScam.co.uk For questions related to vaccine distribution or appointments, please email vaccine@[email or web address] .[email or web address] or call [phone])[phone].   You can call and schedule your mammogram: [facility] at [facility] [place],  [place]  [address] Get Driving Directions Main: [phone]  Get and take daily at bedtime a antihistamine like zyrtec, claritin, or allegra or generic equivalent  Let me know if you need to see the ENT specialist

## 2020-01-08 NOTE — Progress Notes (Signed)
Name: Bethany Bowman   MRN: [id number]    DOB: [birth year]   Date:01/08/2020       Progress Note  Chief Complaint  Patient presents with  . Hypertension    follow up, medication refills  . Hyperlipidemia     Subjective:   Bethany Bowman is a 69 y.o. female, presents to clinic for routine f/up  Hypertension:  Currently managed on atenolol and lisinopril-HCTZ Pt reports good med compliance and denies any SE.  No lightheadedness, hypotension, syncope. Blood pressure today is well controlled. BP Readings from Last 3 Encounters:  01/08/20 128/84  07/16/19 (!) 142/90  07/02/19 (!) 150/94  She is checking BP at home tends to run 130's/80's Changed her diet, cut out fried foods, decreased salt content in foods Pt denies CP, SOB, exertional sx, LE edema, palpitation, Ha's, visual disturbances   Hyperlipidemia: Currently treated with lipitor 40 mg, pt reports good med compliance Last Lipids: Lab Results  Component Value Date   CHOL 163 07/02/2019   HDL 51 07/02/2019   LDLCALC 94 07/02/2019   TRIG 86 07/02/2019   CHOLHDL 3.2 07/02/2019   - Denies: Chest pain, shortness of breath, myalgias, claudication   RLS - it has gotten better, not waking her up, rarely sx wake her up   Current Outpatient Medications:  .  aspirin EC 81 MG tablet, Take 1 tablet (81 mg total) by mouth daily., Disp: , Rfl:  .  atenolol (TENORMIN) 50 MG tablet, TAKE 1 TABLET EVERY DAY, Disp: 90 tablet, Rfl: 1 .  atorvastatin (LIPITOR) 40 MG tablet, TAKE 1 TABLET EVERY DAY, Disp: 90 tablet, Rfl: 2 .  cholecalciferol (VITAMIN D) 1000 units tablet, Take 1,000 Units by mouth daily., Disp: , Rfl:  .  fluticasone (FLONASE) 50 MCG/ACT nasal spray, USE 2 SPRAYS IN EACH NOSTRIL EVERY DAY, Disp: 48 g, Rfl: 2 .  lisinopril-hydrochlorothiazide (ZESTORETIC) 20-25 MG tablet, TAKE 1 TABLET EVERY DAY, Disp: 90 tablet, Rfl: 3 .  potassium chloride (KLOR-CON) 10 MEQ tablet, TAKE 1 TABLET EVERY DAY, Disp: 90 tablet, Rfl: 1 .   FLUZONE HIGH-DOSE QUADRIVALENT 0.7 ML SUSY, , Disp: , Rfl:   Patient Active Problem List   Diagnosis Date Noted  . Restless legs 09/12/2017  . Obesity (BMI 35.0-39.9 without comorbidity) 03/15/2017  . History of colonoscopy with polypectomy 03/15/2017  . Hyperlipidemia LDL goal <100 02/18/2016  . Encounter for medication monitoring 02/18/2016  . Screening for depression 11/25/2014  . Hypertriglyceridemia 11/25/2014  . Insomnia 11/25/2014  . Dysmetabolic syndrome 32/35/5732  . Benign essential HTN 07/01/2007    Past Surgical History:  Procedure Laterality Date  . COLONOSCOPY WITH PROPOFOL N/A 12/28/2016   Procedure: COLONOSCOPY WITH PROPOFOL;  Surgeon: Jonathon Bellows, MD;  [facility] END[facility]COPY;  Service: Endoscopy;  Laterality: N/A;  . COLONOSCOPY WITH PROPOFOL N/A 01/15/2018   Procedure: COLONOSCOPY WITH PROPOFOL;  Surgeon: Jonathon Bellows, MD;  Location: [facility] ENDOSCOPY;  Service: Gastroenterology;  Laterality: N/A;    Family History  Problem Relation Age of Onset  . Colon cancer Mother   . Hypertension Mother   . Alcohol abuse Father   . Hypertension Father   . Hypertension Sister   . Hypertension Brother   . Prostate cancer Brother   . Bone cancer Brother   . Lung cancer Brother   . Hypertension Brother   . Hypertension Brother   . Breast cancer Neg Hx     Social History   Tobacco Use  . Smoking status: Former Smoker  Packs/day: 0.50    Years: 12.00    Pack years: 6.00    Types: Cigarettes    Quit date: 08/03/1981    Years since quitting: 38.4  . Smokeless tobacco: Never Used  . Tobacco comment: smoking cessation materials not required  Vaping Use  . Vaping Use: Never used  Substance Use Topics  . Alcohol use: No    Alcohol/week: 0.0 standard drinks  . Drug use: No     No Known Allergies  Health Maintenance  Topic Date Due  . COVID-19 Vaccine (1) Never done  . PNA vac Low Risk Adult (2 of 2 - PPSV23) 04/02/2018  . INFLUENZA VACCINE  01/18/2020  .  MAMMOGRAM  02/18/2020  . COLONOSCOPY  01/15/2021  . TETANUS/TDAP  08/27/2022  . DEXA SCAN  Completed  . Hepatitis C Screening  Completed    Chart Review Today: I personally reviewed active problem list, medication list, allergies, family history, social history, health maintenance, notes from last encounter, lab results, imaging with the patient/caregiver today.   Review of Systems  10 Systems reviewed and are negative for acute change except as noted in the HPI.  Objective:   Vitals:   01/08/20 1037  BP: 128/84  Pulse: 74  Resp: 18  Temp: 97.9 F (36.6 C)  TempSrc: Temporal  SpO2: 99%  Weight: (!) 251 lb (113.9 kg)  Height: 5\' 9"  (1.753 m)    Body mass index is 37.07 kg/m.  Physical Exam Vitals and nursing note reviewed.  Constitutional:      General: She is not in acute distress.    Appearance: Normal appearance. She is well-developed. She is obese. She is not ill-appearing, toxic-appearing or diaphoretic.     Interventions: Face mask in place.  HENT:     Head: Normocephalic and atraumatic.     Right Ear: Hearing, tympanic membrane, ear canal and external ear normal.     Left Ear: Hearing, tympanic membrane, ear canal and external ear normal.     Nose: Mucosal edema, congestion and rhinorrhea present. Rhinorrhea is clear.     Right Turbinates: Enlarged and swollen.     Left Turbinates: Enlarged and swollen.     Right Sinus: Maxillary sinus tenderness and frontal sinus tenderness present.     Left Sinus: Maxillary sinus tenderness and frontal sinus tenderness present.     Mouth/Throat:     Mouth: Mucous membranes are not pale.     Pharynx: Uvula midline. No oropharyngeal exudate, posterior oropharyngeal erythema or uvula swelling.     Tonsils: No tonsillar abscesses.  Eyes:     General: Lids are normal. No scleral icterus.       Right eye: No discharge.        Left eye: No discharge.     Conjunctiva/sclera: Conjunctivae normal.     Pupils: Pupils are equal,  round, and reactive to light.  Neck:     Trachea: Phonation normal. No tracheal deviation.  Cardiovascular:     Rate and Rhythm: Normal rate and regular rhythm.     Pulses: Normal pulses.          Radial pulses are 2+ on the right side and 2+ on the left side.       Posterior tibial pulses are 2+ on the right side and 2+ on the left side.     Heart sounds: Normal heart sounds. No murmur heard.  No friction rub. No gallop.   Pulmonary:     Effort: Pulmonary effort is  normal. No respiratory distress.     Breath sounds: Normal breath sounds. No stridor. No wheezing, rhonchi or rales.  Chest:     Chest wall: No tenderness.  Abdominal:     General: Bowel sounds are normal. There is no distension.     Palpations: Abdomen is soft.     Tenderness: There is no abdominal tenderness. There is no guarding or rebound.  Musculoskeletal:        General: No deformity. Normal range of motion.     Cervical back: Normal range of motion and neck supple.     Right lower leg: No edema.     Left lower leg: No edema.  Lymphadenopathy:     Cervical: No cervical adenopathy.  Skin:    General: Skin is warm and dry.     Capillary Refill: Capillary refill takes less than 2 seconds.     Coloration: Skin is not jaundiced or pale.     Findings: No rash.  Neurological:     Mental Status: She is alert and oriented to person, place, and time.     Motor: No abnormal muscle tone.     Coordination: Coordination normal.     Gait: Gait normal.  Psychiatric:        Speech: Speech normal.        Behavior: Behavior normal.         Assessment & Plan:     ICD-10-CM   1. Benign essential HTN  I10 atenolol (TENORMIN) 50 MG tablet    COMPLETE METABOLIC PANEL WITH GFR   stable, well controlled, check renal function and electrolytes, currently no SE or concerns with meds, continue diet/lifestyle efforts  2. Hyperlipidemia LDL goal <100  E78.5 atorvastatin (LIPITOR) 40 MG tablet    COMPLETE METABOLIC PANEL WITH GFR    compliant with statin, no SE or concerns  3. Obesity (BMI 35.0-39.9 without comorbidity)  E66.9    with DM, HTN, HLD, RLS, continue diet/lifestyle, exercise efforts   4. Restless legs  G25.81    pt sx have improved and are more rare now, no longer affecting her most night or waking her frequently  5. Hypokalemia  C00.3 COMPLETE METABOLIC PANEL WITH GFR   abnormal labs, recheck  6. Encounter for screening mammogram for malignant neoplasm of breast  Z12.31 MM 3D SCREEN BREAST BILATERAL  7. Chronic pansinusitis  J32.4 azelastine (ASTELIN) 0.1 % nasal spray    amoxicillin-clavulanate (AUGMENTIN) 875-125 MG tablet   chronic sx, encouraged OTC antihistamines and rx nasal spray, augmentin for prolonged severe sx, discussed ABS, med SE, encouraged pt to f/up if not improving  8. Encounter for medication monitoring  Z51.81   9. Vaccine counseling  Z71.89    covid vaccine counseling and discussion today - pt given info to refer to, encouraged to get     Return in about 6 months (around 07/10/2020) for Routine follow-up.   Delsa Grana, PA-C 01/08/20 10:51 AM

## 2020-01-27 ENCOUNTER — Telehealth: Payer: Self-pay | Admitting: Family Medicine

## 2020-01-27 NOTE — Telephone Encounter (Signed)
Copied from [place] [id number]. Topic: Medicare AWV >> Jan 27, 2020  2:30 PM Cher Nakai R wrote: Reason for CRM:   Left message for patient to call back and schedule the Medicare Annual Wellness Visit (AWV) in office or virtual  Last AWV 12/17/2018  Please schedule at anytime with [place].  40 minute appointment  Any questions, please contact me at [phone]

## 2020-02-02 ENCOUNTER — Encounter: Payer: Self-pay | Admitting: Family Medicine

## 2020-02-18 ENCOUNTER — Telehealth: Payer: Self-pay | Admitting: Family Medicine

## 2020-02-18 NOTE — Telephone Encounter (Signed)
Copied from [place] [id number]. Topic: Medicare AWV >> Feb 18, 2020  2:39 PM Cher Nakai R wrote: Reason for CRM:  Left message for patient to call back and schedule the Medicare Annual Wellness Visit (AWV) in office or virtual  Last AWV 12/17/2018  Please schedule at anytime with [place].  40 minute appointment  Any questions, please contact me at [phone]

## 2020-04-06 ENCOUNTER — Telehealth: Payer: Self-pay | Admitting: Family Medicine

## 2020-04-06 NOTE — Telephone Encounter (Signed)
Copied from [place] [id number]. Topic: Medicare AWV >> Apr 06, 2020  2:13 PM Cher Nakai R wrote: Reason for CRM:  Left message for patient to call back and schedule the Medicare Annual Wellness Visit (AWV) in office or virtual  Last AWV   12/17/2018  Please schedule at anytime with [place].  40 minute appointment  Any questions, please contact me at [phone]

## 2020-04-20 ENCOUNTER — Other Ambulatory Visit: Payer: Self-pay | Admitting: Emergency Medicine

## 2020-04-20 DIAGNOSIS — J0140 Acute pansinusitis, unspecified: Secondary | ICD-10-CM

## 2020-04-20 DIAGNOSIS — J302 Other seasonal allergic rhinitis: Secondary | ICD-10-CM

## 2020-04-20 MED ORDER — FLUTICASONE PROPIONATE 50 MCG/ACT NA SUSP: 2.0000 | Freq: Every day | NASAL | 2 refills | Status: DC

## 2020-05-03 ENCOUNTER — Ambulatory Visit
Admission: RE | Admit: 2020-05-03 | Discharge: 2020-05-03 | Disposition: A | Discharge status: Home / Self Care | Payer: Medicare HMO | Source: Ambulatory Visit | Attending: Family Medicine | Admitting: Family Medicine

## 2020-05-03 ENCOUNTER — Other Ambulatory Visit: Payer: Self-pay

## 2020-05-03 DIAGNOSIS — Z1231 Encounter for screening mammogram for malignant neoplasm of breast: Secondary | ICD-10-CM | POA: Diagnosis not present

## 2020-07-13 ENCOUNTER — Encounter: Payer: Self-pay | Admitting: Family Medicine

## 2020-07-13 ENCOUNTER — Ambulatory Visit (INDEPENDENT_AMBULATORY_CARE_PROVIDER_SITE_OTHER): Payer: Medicare HMO | Admitting: Family Medicine

## 2020-07-13 ENCOUNTER — Other Ambulatory Visit: Payer: Self-pay

## 2020-07-13 VITALS — BP 124/78 | HR 80 | Temp 98.4°F | Resp 16 | Ht 69.0 in | Wt 267.0 lb

## 2020-07-13 DIAGNOSIS — I10 Essential (primary) hypertension: Secondary | ICD-10-CM | POA: Diagnosis not present

## 2020-07-13 DIAGNOSIS — Z23 Encounter for immunization: Secondary | ICD-10-CM

## 2020-07-13 DIAGNOSIS — E669 Obesity, unspecified: Secondary | ICD-10-CM

## 2020-07-13 DIAGNOSIS — Z5181 Encounter for therapeutic drug level monitoring: Secondary | ICD-10-CM

## 2020-07-13 DIAGNOSIS — E785 Hyperlipidemia, unspecified: Secondary | ICD-10-CM | POA: Diagnosis not present

## 2020-07-13 NOTE — Progress Notes (Signed)
**Note De-Identified Bethany Obfuscation** Name: NUHA DEGNER   MRN: [id number]    DOB: 12-14-1950   Date:07/13/2020       Progress Note  Chief Complaint  Patient presents with  . Follow-up  . Hypertension  . Hyperlipidemia     Subjective:   Bethany Bowman is a 70 y.o. female, presents to clinic for   Hyperlipidemia: Currently treated with Lipitor, pt reports good med compliance Last Lipids: Lab Results  Component Value Date   CHOL 163 07/02/2019   HDL 51 07/02/2019   LDLCALC 94 07/02/2019   TRIG 86 07/02/2019   CHOLHDL 3.2 07/02/2019   - Denies: Chest pain, shortness of breath, myalgias, claudication  Hypertension:  Currently managed on Atenolol and Lisinopril/HCTZ Pt reports good med compliance and denies any SE.   Blood pressure today is well controlled. BP Readings from Last 3 Encounters:  07/13/20 124/78  01/08/20 128/84  07/16/19 (!) 142/90   Pt denies CP, SOB, exertional sx, LE edema, palpitation, Ha's, visual disturbances, lightheadedness, hypotension, syncope.    Current Outpatient Medications:  .  aspirin EC 81 MG tablet, Take 1 tablet (81 mg total) by mouth daily., Disp: , Rfl:  .  atenolol (TENORMIN) 50 MG tablet, Take 1 tablet (50 mg total) by mouth daily., Disp: 90 tablet, Rfl: 3 .  atorvastatin (LIPITOR) 40 MG tablet, Take 1 tablet (40 mg total) by mouth daily., Disp: 90 tablet, Rfl: 3 .  azelastine (ASTELIN) 0.1 % nasal spray, Place 2 sprays into both nostrils 2 (two) times daily. Use in each nostril as directed, Disp: 30 mL, Rfl: 12 .  cholecalciferol (VITAMIN D) 1000 units tablet, Take 1,000 Units by mouth daily., Disp: , Rfl:  .  fluticasone (FLONASE) 50 MCG/ACT nasal spray, Place 2 sprays into both nostrils daily., Disp: 48 g, Rfl: 2 .  lisinopril-hydrochlorothiazide (ZESTORETIC) 20-25 MG tablet, TAKE 1 TABLET EVERY DAY, Disp: 90 tablet, Rfl: 3 .  potassium chloride (KLOR-CON) 10 MEQ tablet, TAKE 1 TABLET EVERY DAY, Disp: 90 tablet, Rfl: 1  Patient Active Problem List   Diagnosis Date  Noted  . Restless legs 09/12/2017  . Obesity (BMI 35.0-39.9 without comorbidity) 03/15/2017  . History of colonoscopy with polypectomy 03/15/2017  . Hyperlipidemia LDL goal <100 02/18/2016  . Encounter for medication monitoring 02/18/2016  . Screening for depression 11/25/2014  . Hypertriglyceridemia 11/25/2014  . Insomnia 11/25/2014  . Dysmetabolic syndrome 13/24/4010  . Benign essential HTN 07/01/2007    Past Surgical History:  Procedure Laterality Date  . COLONOSCOPY WITH PROPOFOL N/A 12/28/2016   Procedure: COLONOSCOPY WITH PROPOFOL;  Surgeon: Jonathon Bellows, MD;  Location: [facility] ENDOSCOPY;  Service: Endoscopy;  Laterality: N/A;  . COLONOSCOPY WITH PROPOFOL N/A 01/15/2018   Procedure: COLONOSCOPY WITH PROPOFOL;  Surgeon: Jonathon Bellows, MD;  Location: [facility] ENDOSCOPY;  Service: Gastroenterology;  Laterality: N/A;    Family History  Problem Relation Age of Onset  . Colon cancer Mother   . Hypertension Mother   . Alcohol abuse Father   . Hypertension Father   . Hypertension Sister   . Hypertension Brother   . Prostate cancer Brother   . Bone cancer Brother   . Lung cancer Brother   . Hypertension Brother   . Hypertension Brother   . Breast cancer Neg Hx     Social History   Tobacco Use  . Smoking status: Former Smoker    Packs/day: 0.50    Years: 12.00    Pack years: 6.00    Types: Cigarettes  Quit date: 08/03/1981    Years since quitting: 38.9  . Smokeless tobacco: Never Used  . Tobacco comment: smoking cessation materials not required  Vaping Use  . Vaping Use: Never used  Substance Use Topics  . Alcohol use: No    Alcohol/week: 0.0 standard drinks  . Drug use: No     No Known Allergies  Health Maintenance  Topic Date Due  . COVID-19 Vaccine (1) Never done  . PNA vac Low Risk Adult (2 of 2 - PPSV23) 02/01/2021 (Originally 04/02/2018)  . COLONOSCOPY (Pts 45-78yrs Insurance coverage will need to be confirmed)  01/15/2021  . MAMMOGRAM  05/03/2021  .  TETANUS/TDAP  08/27/2022  . INFLUENZA VACCINE  Completed  . DEXA SCAN  Completed  . Hepatitis C Screening  Completed    Chart Review Today: I personally reviewed active problem list, medication list, allergies, family history, social history, health maintenance, notes from last encounter, lab results, imaging with the patient/caregiver today.   Review of Systems  10 Systems reviewed and are negative for acute change except as noted in the HPI.  Objective:   Vitals:   07/13/20 1113  BP: 124/78  Pulse: 80  Resp: 16  Temp: 98.4 F (36.9 C)  SpO2: 99%  Weight: 267 lb (121.1 kg)  Height: 5\' 9"  (1.753 m)    Body mass index is 39.43 kg/m.  Physical Exam Vitals and nursing note reviewed.  Constitutional:      General: She is not in acute distress.    Appearance: Normal appearance. She is well-developed. She is obese. She is not ill-appearing, toxic-appearing or diaphoretic.     Interventions: Face mask in place.  HENT:     Head: Normocephalic and atraumatic.     Right Ear: External ear normal.     Left Ear: External ear normal.  Eyes:     General: Lids are normal. No scleral icterus.       Right eye: No discharge.        Left eye: No discharge.     Conjunctiva/sclera: Conjunctivae normal.  Neck:     Trachea: Phonation normal. No tracheal deviation.  Cardiovascular:     Rate and Rhythm: Normal rate and regular rhythm.     Pulses: Normal pulses.          Radial pulses are 2+ on the right side and 2+ on the left side.       Posterior tibial pulses are 2+ on the right side and 2+ on the left side.     Heart sounds: Normal heart sounds. No murmur heard. No friction rub. No gallop.   Pulmonary:     Effort: Pulmonary effort is normal. No respiratory distress.     Breath sounds: Normal breath sounds. No stridor. No wheezing, rhonchi or rales.  Chest:     Chest wall: No tenderness.  Abdominal:     General: Bowel sounds are normal. There is no distension.     Palpations:  Abdomen is soft.  Musculoskeletal:     Right lower leg: No edema.     Left lower leg: No edema.  Skin:    General: Skin is warm and dry.     Coloration: Skin is not jaundiced or pale.     Findings: No rash.  Neurological:     Mental Status: She is alert.     Motor: No abnormal muscle tone.     Gait: Gait normal.  Psychiatric:        Mood  and Affect: Mood normal.        Speech: Speech normal.        Behavior: Behavior normal.         Assessment & Plan:     ICD-10-CM   1. Benign essential HTN  99991111 COMPLETE METABOLIC PANEL WITH GFR   well controlled, stable, BP at goal today, continue meds and diet/lifestyle efforts  2. Hyperlipidemia LDL goal <100  Compliant with meds, no SE, no myalgias, fatigue or jaundice Due for FLP and recheck CMP Diet and exercise recommendations for HLD reviewed   E78.5 Lipid panel    COMPLETE METABOLIC PANEL WITH GFR  3. Obesity (BMI 35.0-39.9 without comorbidity)  E66.9   4. Encounter for medication monitoring  Z51.81 Lipid panel    COMPLETE METABOLIC PANEL WITH GFR  5. Need for influenza vaccination  Z23 Flu Vaccine QUAD High Dose(Fluad)    CANCELED: Flu vaccine HIGH DOSE PF (Fluzone High dose)     Return in about 6 months (around 01/10/2021) for Routine follow-up.   Delsa Grana, PA-C 07/13/20 11:32 AM

## 2020-07-13 NOTE — Patient Instructions (Addendum)
Health Maintenance  Topic Date Due  . COVID-19 Vaccine (1) Never done  . Pneumonia vaccines (2 of 2 - PPSV23) 02/01/2021*  . Colon Cancer Screening  01/15/2021  . Mammogram  05/03/2021  . Tetanus Vaccine  08/27/2022  . Flu Shot  Completed  . DEXA scan (bone density measurement)  Completed  .  Hepatitis C: One time screening is recommended by Center for Disease Control  (CDC) for  adults born from [birth year] through 1967.   Completed  *Topic was postponed. The date shown is not the original due date.     Potassium Content of Foods  Potassium is a mineral found in many foods and drinks. It affects how the heart works, and helps keep fluids and minerals balanced in the body. The amount of potassium you need each day depends on your age and any medical conditions you may have. Talk to your health care provider or dietitian about how much potassium you need. The following lists of foods provide the general serving size for foods and the approximate amount of potassium in each serving, listed in milligrams (mg). Actual values may vary depending on the product and how it is processed. High in potassium The following foods and beverages have 200 mg or more of potassium per serving:  Apricots (raw) - 2 have 200 mg of potassium.  Apricots (dry) - 5 have 200 mg of potassium.  Artichoke - 1 medium has 345 mg of potassium.  Avocado -  fruit has 245 mg of potassium.  Banana - 1 medium fruit has 425 mg of potassium.  [place] or baked beans (canned) -  cup has 280 mg of potassium.  White beans (canned) -  cup has 595 mg potassium.  Beef roast - 3 oz has 320 mg of potassium.  Ground beef - 3 oz has 270 mg of potassium.  Beets (raw or cooked) -  cup has 260 mg of potassium.  Bran muffin - 2 oz has 300 mg of potassium.  Broccoli (cooked) -  cup has 230 mg of potassium.  Brussels sprouts -  cup has 250 mg of potassium.  Cantaloupe -  cup has 215 mg of potassium.  Cereal, 100% bran -  cup  has 200-400 mg of potassium.  Cheeseburger -1 single fast food burger has 225-400 mg of potassium.  Chicken - 3 oz has 220 mg of potassium.  Clams (canned) - 3 oz has 535 mg of potassium.  Crab - 3 oz has 225 mg of potassium.  Dates - 5 have 270 mg of potassium.  Dried beans and peas -  cup has 300-475 mg of potassium.  Figs (dried) - 2 have 260 mg of potassium.  Fish (halibut, tuna, cod, snapper) - 3 oz has 480 mg of potassium.  Fish (salmon, haddock, swordfish, perch) - 3 oz has 300 mg of potassium.  Fish (tuna, canned) - 3 oz has 200 mg of potassium.  [place] fries (fast food) - 3 oz has 470 mg of potassium.  Granola with fruit and nuts -  cup has 200 mg of potassium.  Grapefruit juice -  cup has 200 mg of potassium.  Honeydew melon -  cup has 200 mg of potassium.  Kale (raw) - 1 cup has 300 mg of potassium.  Kiwi - 1 medium fruit has 240 mg of potassium.  Kohlrabi, rutabaga, parsnips -  cup has 280 mg of potassium.  Lentils -  cup has 365 mg of potassium.  Mango - 1 each  has 325 mg of potassium.  Milk (nonfat, low-fat, whole, buttermilk) - 1 cup has 350-380 mg of potassium.  Milk (chocolate) - 1 cup has 420 mg of potassium  Molasses - 1 Tbsp has 295 mg of potassium.  Mushrooms -  cup has 280 mg of potassium.  Nectarine - 1 each has 275 mg of potassium.  Nuts (almonds, peanuts, hazelnuts, [place], cashew, mixed) - 1 oz has 200 mg of potassium.  Nuts (pistachios) - 1 oz has 295 mg of potassium.  Orange - 1 fruit has 240 mg of potassium.  Orange juice -  cup has 235 mg of potassium.  Papaya -  medium fruit has 390 mg of potassium.  Peanut butter (chunky) - 2 Tbsp has 240 mg of potassium.  Peanut butter (smooth) - 2 Tbsp has 210 mg of potassium.  Pear - 1 medium (200 mg) of potassium.  Pomegranate - 1 whole fruit has 400 mg of potassium.  Pomegranate juice -  cup has 215 mg of potassium.  Pork - 3 oz has 350 mg of potassium.  Potato  chips (salted) - 1 oz has 465 mg of potassium.  Potato (baked with skin) - 1 medium has 925 mg of potassium.  Potato (boiled) -  cup has 255 mg of potassium.  Potato (Mashed) -  cup has 330 mg of potassium.  Prune juice -  cup has 370 mg of potassium.  Prunes - 5 have 305 mg of potassium.  Pudding (chocolate) -  cup has 230 mg of potassium.  Pumpkin (canned) -  cup has 250 mg of potassium.  Raisins (seedless) -  cup has 270 mg of potassium.  Seeds (sunflower or pumpkin) - 1 oz has 240 mg of potassium.  Soy milk - 1 cup has 300 mg of potassium.  Spinach (cooked) - 1/2 cup has 420 mg of potassium.  Spinach (canned) -  cup has 370 mg of potassium.  Sweet potato (baked with skin) - 1 medium has 450 mg of potassium.  Swiss chard -  cup has 480 mg of potassium.  Tomato or vegetable juice -  cup has 275 mg of potassium.  Tomato (sauce or puree) -  cup has 400-550 mg of potassium.  Tomato (raw) - 1 medium has 290 mg of potassium.  Tomato (canned) -  cup has 200-300 mg of potassium.  [place] - 3 oz has 250 mg of potassium.  Wheat germ - 1 oz has 250 mg of potassium.  Winter squash -  cup has 250 mg of potassium.  Yogurt (plain or fruited) - 6 oz has 260-435 mg of potassium.  Zucchini -  cup has 220 mg of potassium. Moderate in potassium The following foods and beverages have 50-200 mg of potassium per serving:  Apple - 1 fruit has 150 mg of potassium  Apple juice -  cup has 150 mg of potassium  Applesauce -  cup has 90 mg of potassium  Apricot nectar -  cup has 140 mg of potassium  Asparagus (small spears) -  cup has 155 mg of potassium  Asparagus (large spears) - 6 have 155 mg of potassium  Bagel (cinnamon raisin) - 1 four-inch bagel has 130 mg of potassium  Bagel (egg or plain) - 1 four- inch bagel has 70 mg of potassium  Beans (green) -  cup has 90 mg of potassium  Beans (yellow) -  cup has 190 mg of potassium  Beer, regular - 12 oz  has 100 mg of  potassium  Beets (canned) -  cup has 125 mg of potassium  Blackberries -  cup has 115 mg of potassium  Blueberries -  cup has 60 mg of potassium  Bread (whole wheat) - 1 slice has 70 mg of potassium  Broccoli (raw) -  cup has 145 mg of potassium  Cabbage -  cup has 150 mg of potassium  Carrots (cooked or raw) -  cup has 180 mg of potassium  Cauliflower (raw) -  cup has 150 mg of potassium  Celery (raw) -  cup has 155 mg of potassium  Cereal, bran flakes -  cup has 120-150 mg of potassium  Cheese (cottage) -  cup has 110 mg of potassium  Cherries - 10 have 150 mg of potassium  Chocolate - 1 oz bar has 165 mg of potassium  Coffee (brewed) - 6 oz has 90 mg of potassium  Corn -  cup or 1 ear has 195 mg of potassium  Cucumbers -  cup has 80 mg of potassium  Egg - 1 large egg has 60 mg of potassium  Eggplant -  cup has 60 mg of potassium  Endive (raw) -  cup has 80 mg of potassium  English muffin - 1 has 65 mg of potassium  Fish (ocean perch) - 3 oz has 192 mg of potassium  Frankfurter, beef or pork - 1 has 75 mg of potassium  Fruit cocktail -  cup has 115 mg of potassium  Grape juice -  cup has 170 mg of potassium  Grapefruit -  fruit has 175 mg of potassium  Grapes -  cup has 155 mg of potassium  Greens: kale, turnip, collard -  cup has 110-150 mg of potassium  Ice cream or frozen yogurt (chocolate) -  cup has 175 mg of potassium  Ice cream or frozen yogurt (vanilla) -  cup has 120-150 mg of potassium  Lemons, limes - 1 each has 80 mg of potassium  Lettuce - 1 cup has 100 mg of potassium  Mixed vegetables -  cup has 150 mg of potassium  Mushrooms, raw -  cup has 110 mg of potassium  Nuts (walnuts, pecans, or macadamia) - 1 oz has 125 mg of potassium  Oatmeal -  cup has 80 mg of potassium  Okra -  cup has 110 mg of potassium  Onions -  cup has 120 mg of potassium  Peach - 1 has 185 mg of  potassium  Peaches (canned) -  cup has 120 mg of potassium  Pears (canned) -  cup has 120 mg of potassium  Peas, green (frozen) -  cup has 90 mg of potassium  Peppers (Green) -  cup has 130 mg of potassium  Peppers (Red) -  cup has 160 mg of potassium  Pineapple juice -  cup has 165 mg of potassium  Pineapple (fresh or canned) -  cup has 100 mg of potassium  Plums - 1 has 105 mg of potassium  Pudding, vanilla -  cup has 150 mg of potassium  Raspberries -  cup has 90 mg of potassium  Rhubarb -  cup has 115 mg of potassium  Rice, wild -  cup has 80 mg of potassium  Shrimp - 3 oz has 155 mg of potassium  Spinach (raw) - 1 cup has 170 mg of potassium  Strawberries -  cup has 125 mg of potassium  Summer squash -  cup has 175-200 mg of potassium  Swiss  chard (raw) - 1 cup has 135 mg of potassium  Tangerines - 1 fruit has 140 mg of potassium  Tea, brewed - 6 oz has 65 mg of potassium  Turnips -  cup has 140 mg of potassium  Watermelon -  cup has 85 mg of potassium  Wine (Red, table) - 5 oz has 180 mg of potassium  Wine (White, table) - 5 oz 100 mg of potassium Low in potassium The following foods and beverages have less than 50 mg of potassium per serving.  Bread (white) - 1 slice has 30 mg of potassium  Carbonated beverages - 12 oz has less than 5 mg of potassium  Cheese - 1 oz has 20-30 mg of potassium  Cranberries -  cup has 45 mg of potassium  Cranberry juice cocktail -  cup has 20 mg of potassium  Fats and oils - 1 Tbsp has less than 5 mg of potassium  Hummus - 1 Tbsp has 32 mg of potassium  Nectar (papaya, mango, or pear) -  cup has 35 mg of potassium  Rice (white or brown) -  cup has 50 mg of potassium  Spaghetti or macaroni (cooked) -  cup has 30 mg of potassium  T[place]illa, flour or corn - 1 has 50 mg of potassium  Waffle - 1 four-inch waffle has 50 mg of potassium  Water chestnuts -  cup has 40 mg of  potassium Summary  Potassium is a mineral found in many foods and drinks. It affects how the heart works, and helps keep fluids and minerals balanced in the body.  The amount of potassium you need each day depends on your age and any existing medical conditions you may have. Your health care provider or dietitian may recommend an amount of potassium that you should have each day. This information is not intended to replace advice given to you by your health care provider. Make sure you discuss any questions you have with your health care provider. Document Revised: 05/18/2017 Document Reviewed: 08/30/2016 Elsevier Patient Education  [place].

## 2020-07-14 LAB — COMPLETE METABOLIC PANEL WITH GFR
AG Ratio: 1.4 (calc) (ref 1.0–2.5)
ALT: 32 U/L — ABNORMAL HIGH (ref 6–29)
AST: 26 U/L (ref 10–35)
Albumin: 4.2 g/dL (ref 3.6–5.1)
Alkaline phosphatase (APISO): 50 U/L (ref 37–153)
BUN/Creatinine Ratio: 17 (calc) (ref 6–22)
BUN: 18 mg/dL (ref 7–25)
CO2: 32 mmol/L (ref 20–32)
Calcium: 10.4 mg/dL (ref 8.6–10.4)
Chloride: 103 mmol/L (ref 98–110)
Creat: 1.09 mg/dL — ABNORMAL HIGH (ref 0.60–0.93)
GFR, Est African American: 59 mL/min/{1.73_m2} — ABNORMAL LOW (ref >=60)
GFR, Est Non African American: 51 mL/min/{1.73_m2} — ABNORMAL LOW (ref >=60)
Globulin: 2.9 g/dL (calc) (ref 1.9–3.7)
Glucose, Bld: 92 mg/dL (ref 65–99)
Potassium: 3.8 mmol/L (ref 3.5–5.3)
Sodium: 145 mmol/L (ref 135–146)
Total Bilirubin: 0.4 mg/dL (ref 0.2–1.2)
Total Protein: 7.1 g/dL (ref 6.1–8.1)

## 2020-07-14 LAB — LIPID PANEL
Cholesterol: 146 mg/dL (ref <200)
HDL: 50 mg/dL (ref >=50)
LDL Cholesterol (Calc): 77 mg/dL (calc)
Non-HDL Cholesterol (Calc): 96 mg/dL (calc) (ref <130)
Total CHOL/HDL Ratio: 2.9 (calc) (ref <5.0)
Triglycerides: 102 mg/dL (ref <150)

## 2020-08-04 ENCOUNTER — Other Ambulatory Visit: Payer: Self-pay | Admitting: Family Medicine

## 2020-08-04 DIAGNOSIS — I10 Essential (primary) hypertension: Secondary | ICD-10-CM

## 2020-10-19 ENCOUNTER — Other Ambulatory Visit: Payer: Self-pay | Admitting: Family Medicine

## 2020-10-19 DIAGNOSIS — E785 Hyperlipidemia, unspecified: Secondary | ICD-10-CM

## 2020-10-19 DIAGNOSIS — I10 Essential (primary) hypertension: Secondary | ICD-10-CM

## 2020-11-12 ENCOUNTER — Other Ambulatory Visit: Payer: Self-pay | Admitting: Family Medicine

## 2020-11-12 DIAGNOSIS — E785 Hyperlipidemia, unspecified: Secondary | ICD-10-CM

## 2020-11-25 ENCOUNTER — Telehealth: Payer: Self-pay | Admitting: Family Medicine

## 2020-11-25 NOTE — Telephone Encounter (Signed)
Copied from [place] [id number]. Topic: Medicare AWV >> Nov 25, 2020  2:50 PM Cher Nakai R wrote: Reason for CRM:  Left message for patient to call back and schedule Medicare Annual Wellness Visit (AWV) in office.   If unable to come into the office for AWV,  please offer to do virtually or by telephone.  Last AWV: 12/17/2018  Please schedule at anytime with [place].  40 minute appointment  Any questions, please contact me at [phone]

## 2020-12-07 ENCOUNTER — Other Ambulatory Visit: Payer: Self-pay | Admitting: Family Medicine

## 2020-12-07 DIAGNOSIS — J302 Other seasonal allergic rhinitis: Secondary | ICD-10-CM

## 2020-12-07 DIAGNOSIS — J0140 Acute pansinusitis, unspecified: Secondary | ICD-10-CM

## 2020-12-29 ENCOUNTER — Other Ambulatory Visit: Payer: Self-pay | Admitting: Family Medicine

## 2020-12-29 DIAGNOSIS — I10 Essential (primary) hypertension: Secondary | ICD-10-CM

## 2021-01-11 ENCOUNTER — Ambulatory Visit: Payer: Medicare HMO | Admitting: Family Medicine

## 2021-01-21 ENCOUNTER — Telehealth: Payer: Self-pay | Admitting: Family Medicine

## 2021-01-21 NOTE — Telephone Encounter (Signed)
Copied from [place] [id number]. Topic: Medicare AWV >> Jan 21, 2021  2:42 PM Cher Nakai R wrote: Reason for CRM:   Left message for patient to call back and schedule Medicare Annual Wellness Visit (AWV) in office.   If unable to come into the office for AWV,  please offer to do virtually or by telephone.  Last AWV:  12/17/2018  Please schedule at anytime with [place].  40 minute appointment  Any questions, please contact me at [phone]

## 2021-01-28 ENCOUNTER — Ambulatory Visit (INDEPENDENT_AMBULATORY_CARE_PROVIDER_SITE_OTHER): Payer: Medicare HMO | Admitting: Family Medicine

## 2021-01-28 ENCOUNTER — Encounter: Payer: Self-pay | Admitting: Family Medicine

## 2021-01-28 ENCOUNTER — Other Ambulatory Visit: Payer: Self-pay

## 2021-01-28 VITALS — BP 128/84 | HR 69 | Temp 98.4°F | Resp 16 | Ht 69.0 in | Wt 254.1 lb

## 2021-01-28 DIAGNOSIS — E876 Hypokalemia: Secondary | ICD-10-CM

## 2021-01-28 DIAGNOSIS — Z6837 Body mass index (BMI) 37.0-37.9, adult: Secondary | ICD-10-CM

## 2021-01-28 DIAGNOSIS — Z1231 Encounter for screening mammogram for malignant neoplasm of breast: Secondary | ICD-10-CM | POA: Diagnosis not present

## 2021-01-28 DIAGNOSIS — J309 Allergic rhinitis, unspecified: Secondary | ICD-10-CM | POA: Insufficient documentation

## 2021-01-28 DIAGNOSIS — I10 Essential (primary) hypertension: Secondary | ICD-10-CM | POA: Diagnosis not present

## 2021-01-28 DIAGNOSIS — E785 Hyperlipidemia, unspecified: Secondary | ICD-10-CM | POA: Diagnosis not present

## 2021-01-28 DIAGNOSIS — N179 Acute kidney failure, unspecified: Secondary | ICD-10-CM | POA: Diagnosis not present

## 2021-01-28 LAB — COMPLETE METABOLIC PANEL WITH GFR
AG Ratio: 1.4 (calc) (ref 1.0–2.5)
ALT: 27 U/L (ref 6–29)
AST: 22 U/L (ref 10–35)
Albumin: 4.1 g/dL (ref 3.6–5.1)
Alkaline phosphatase (APISO): 48 U/L (ref 37–153)
BUN: 12 mg/dL (ref 7–25)
CO2: 37 mmol/L — ABNORMAL HIGH (ref 20–32)
Calcium: 10.4 mg/dL (ref 8.6–10.4)
Chloride: 101 mmol/L (ref 98–110)
Creat: 0.99 mg/dL (ref 0.60–1.00)
Globulin: 3 g/dL (calc) (ref 1.9–3.7)
Glucose, Bld: 89 mg/dL (ref 65–99)
Potassium: 3.8 mmol/L (ref 3.5–5.3)
Sodium: 143 mmol/L (ref 135–146)
Total Bilirubin: 0.7 mg/dL (ref 0.2–1.2)
Total Protein: 7.1 g/dL (ref 6.1–8.1)
eGFR: 61 mL/min/{1.73_m2} (ref >=60)

## 2021-01-28 NOTE — Patient Instructions (Signed)
[facility] at [facility] [place],  [place]  [address] Get Driving Directions Main: [phone]  Health Maintenance  Topic Date Due   Flu Shot  01/17/2021   Pneumonia vaccines (2 of 2 - PPSV23) 02/01/2021*   COVID-19 Vaccine (1) 02/16/2021*   Colon Cancer Screening  03/18/2021*   Zoster (Shingles) Vaccine (1 of 2) 04/30/2021*   Mammogram  05/03/2021   Tetanus Vaccine  08/27/2022   DEXA scan (bone density measurement)  Completed   Hepatitis C Screening: USPSTF Recommendation to screen - Ages 68-79 yo.  Completed   HPV Vaccine  Aged Out  *Topic was postponed. The date shown is not the original due date.   Call to get your mammogram scheduled in November  We can send in the shingrix if you like

## 2021-01-28 NOTE — Progress Notes (Signed)
Name: Bethany Bowman   MRN: [id number]    DOB: Jul 27, 1950   Date:01/28/2021       Progress Note  Chief Complaint  Patient presents with   Hypertension   Hyperlipidemia     Subjective:   Bethany Bowman is a 70 y.o. female, presents to clinic for routine f/up   HLD on lipitor 40 mg Lab Results  Component Value Date   CHOL 146 07/13/2020   HDL 50 07/13/2020   LDLCALC 77 07/13/2020   TRIG 102 07/13/2020   CHOLHDL 2.9 07/13/2020  Ldl at goal, good compliance, my myalgias, SE or concerns, no CP, claudication type sx  Hypertension:  Currently managed on atenolol, lisinopril-HCTZ with hypokalemia on supplement prn Pt reports good med compliance and denies any SE.   Blood pressure today is well controlled. BP Readings from Last 3 Encounters:  01/28/21 128/84  07/13/20 124/78  01/08/20 128/84  Pt denies CP, SOB, exertional sx, LE edema, palpitation, Ha's, visual disturbances, lightheadedness, hypotension, syncope.   CKD?  AKI?  Last labs showed decrease in GFR relative to pts normal - recheck today  Renal function recent labs:  Lab Results  Component Value Date   GFRAA 59 (L) 07/13/2020   GFRAA 73 01/08/2020   GFRAA 78 07/02/2019    Lab Results  Component Value Date   CREATININE 1.09 (H) 07/13/2020   BUN 18 07/13/2020   NA 145 07/13/2020   K 3.8 07/13/2020   CL 103 07/13/2020   CO2 32 07/13/2020    Post nasal drip and seasonal allergies are worse than normal and lasting longer not on her claritin but continued flonase  Raspy voice, no sore throat Has sinus infections cough SOB  Insomnia - never been a good sleeper  Pt did not do medicare well visit - reviewed screenings briefly and HM and immunizations today Due for mammogram Offered shingrix Brief cognitive eval no concerns with memory or cognitive function No recent fall Mood good, denies depression - phq neg and reviewed today Depression screen [facility] 2/9 01/28/2021 07/13/2020 01/08/2020  Decreased Interest 0 0  0  Down, Depressed, Hopeless 0 0 0  PHQ - 2 Score 0 0 0  Altered sleeping - 0 0  Tired, decreased energy - 0 0  Change in appetite - 0 0  Feeling bad or failure about yourself  - 0 0  Trouble concentrating - 0 0  Moving slowly or fidgety/restless - 0 0  Suicidal thoughts - 0 0  PHQ-9 Score - 0 0  Difficult doing work/chores - Not difficult at all Not difficult at all      Current Outpatient Medications:    aspirin EC 81 MG tablet, Take 1 tablet (81 mg total) by mouth daily., Disp: , Rfl:    atenolol (TENORMIN) 50 MG tablet, TAKE 1 TABLET EVERY DAY, Disp: 90 tablet, Rfl: 1   atorvastatin (LIPITOR) 40 MG tablet, TAKE 1 TABLET (40 MG TOTAL) BY MOUTH DAILY., Disp: 90 tablet, Rfl: 0   azelastine (ASTELIN) 0.1 % nasal spray, Place 2 sprays into both nostrils 2 (two) times daily. Use in each nostril as directed, Disp: 30 mL, Rfl: 12   cholecalciferol (VITAMIN D) 1000 units tablet, Take 1,000 Units by mouth daily., Disp: , Rfl:    fluticasone (FLONASE) 50 MCG/ACT nasal spray, USE 2 SPRAYS IN EACH NOSTRIL EVERY DAY, Disp: 48 g, Rfl: 2   lisinopril-hydrochlorothiazide (ZESTORETIC) 20-25 MG tablet, TAKE 1 TABLET EVERY DAY, Disp: 90 tablet, Rfl: 3  potassium chloride (KLOR-CON) 10 MEQ tablet, TAKE 1 TABLET EVERY DAY (Patient not taking: Reported on 01/28/2021), Disp: 90 tablet, Rfl: 1  Patient Active Problem List   Diagnosis Date Noted   Restless legs 09/12/2017   Obesity (BMI 35.0-39.9 without comorbidity) 03/15/2017   History of colonoscopy with polypectomy 03/15/2017   Hyperlipidemia LDL goal <100 02/18/2016   Encounter for medication monitoring 02/18/2016   Screening for depression 11/25/2014   Hypertriglyceridemia 11/25/2014   Insomnia 99991111   Dysmetabolic syndrome 99991111   Benign essential HTN 07/01/2007    Past Surgical History:  Procedure Laterality Date   COLONOSCOPY WITH PROPOFOL N/A 12/28/2016   Procedure: COLONOSCOPY WITH PROPOFOL;  Surgeon: Jonathon Bellows, MD;   Location[facility] ENDOSCOPY;  Service: Endoscopy;  Laterality: N/A;   COLONOSCOPY WITH PROPOFOL N/A 01/15/2018   Procedure: COLONOSCOPY WITH PROPOFOL;  Surgeon: Jonathon Bellows, MD;  Location: [facility] ENDOSCOPY;  Service: Gastroenterology;  Laterality: N/A;    Family History  Problem Relation Age of Onset   Colon cancer Mother    Hypertension Mother    Alcohol abuse Father    Hypertension Father    Hypertension Sister    Hypertension Brother    Prostate cancer Brother    Bone cancer Brother    Lung cancer Brother    Hypertension Brother    Hypertension Brother    Breast cancer Neg Hx     Social History   Tobacco Use   Smoking status: Former    Packs/day: 0.50    Years: 12.00    Pack years: 6.00    Types: Cigarettes    Quit date: 08/03/1981    Years since quitting: 39.5   Smokeless tobacco: Never   Tobacco comments:    smoking cessation materials not required  Vaping Use   Vaping Use: Never used  Substance Use Topics   Alcohol use: No    Alcohol/week: 0.0 standard drinks   Drug use: No     No Known Allergies  Health Maintenance  Topic Date Due   INFLUENZA VACCINE  01/17/2021   PNA vac Low Risk Adult (2 of 2 - PPSV23) 02/01/2021 (Originally 04/02/2018)   COVID-19 Vaccine (1) 02/16/2021 (Originally 10/15/1955)   COLONOSCOPY (Pts 45-94yr Insurance coverage will need to be confirmed)  03/18/2021 (Originally 01/15/2021)   Zoster Vaccines- Shingrix (1 of 2) 04/30/2021 (Originally 10/16/1998)   MAMMOGRAM  05/03/2021   TETANUS/TDAP  08/27/2022   DEXA SCAN  Completed   Hepatitis C Screening  Completed   HPV VACCINES  Aged Out    Chart Review Today: I personally reviewed active problem list, medication list, allergies, family history, social history, health maintenance, notes from last encounter, lab results, imaging with the patient/caregiver today.   Review of Systems  Constitutional: Negative.   HENT:  Positive for postnasal drip and voice change. Negative for congestion,  ear pain, rhinorrhea, sinus pressure, sinus pain, sneezing, sore throat and trouble swallowing.   Eyes: Negative.   Respiratory: Negative.  Negative for choking, chest tightness and shortness of breath.   Cardiovascular: Negative.  Negative for chest pain.  Gastrointestinal: Negative.   Endocrine: Negative.   Genitourinary: Negative.   Musculoskeletal: Negative.   Skin: Negative.   Allergic/Immunologic: Negative.   Neurological: Negative.   Hematological: Negative.   Psychiatric/Behavioral: Negative.    All other systems reviewed and are negative.   Objective:   Vitals:   01/28/21 0933  BP: 128/84  Pulse: 69  Resp: 16  Temp: 98.4 F (36.9 C)  TempSrc: Oral  SpO2: 97%  Weight: 254 lb 1.6 oz (115.3 kg)  Height: '5\' 9"'$  (1.753 m)    Body mass index is 37.52 kg/m.  Physical Exam Vitals and nursing note reviewed.  Constitutional:      General: She is not in acute distress.    Appearance: Normal appearance. She is well-developed. She is obese. She is not ill-appearing, toxic-appearing or diaphoretic.     Interventions: Face mask in place.  HENT:     Head: Normocephalic and atraumatic.     Right Ear: Tympanic membrane, ear canal and external ear normal. There is no impacted cerumen.     Left Ear: Tympanic membrane, ear canal and external ear normal. There is no impacted cerumen.     Nose: Congestion and rhinorrhea present.     Mouth/Throat:     Mouth: Mucous membranes are moist.     Pharynx: Oropharynx is clear. No oropharyngeal exudate or posterior oropharyngeal erythema.  Eyes:     General: Lids are normal. No scleral icterus.       Right eye: No discharge.        Left eye: No discharge.     Conjunctiva/sclera: Conjunctivae normal.  Neck:     Trachea: Phonation normal. No tracheal deviation.  Cardiovascular:     Rate and Rhythm: Normal rate and regular rhythm.     Pulses: Normal pulses.          Radial pulses are 2+ on the right side and 2+ on the left side.        Posterior tibial pulses are 2+ on the right side and 2+ on the left side.     Heart sounds: Normal heart sounds. No murmur heard.   No friction rub. No gallop.  Pulmonary:     Effort: Pulmonary effort is normal. No respiratory distress.     Breath sounds: Normal breath sounds. No stridor. No wheezing, rhonchi or rales.  Chest:     Chest wall: No tenderness.  Abdominal:     General: Bowel sounds are normal. There is no distension.     Palpations: Abdomen is soft.  Musculoskeletal:     Right lower leg: No edema.     Left lower leg: No edema.  Skin:    General: Skin is warm and dry.     Coloration: Skin is not jaundiced or pale.     Findings: No rash.  Neurological:     Mental Status: She is alert.     Motor: No abnormal muscle tone.     Gait: Gait normal.  Psychiatric:        Mood and Affect: Mood normal.        Speech: Speech normal.        Behavior: Behavior normal.    Results for orders placed or performed in visit on 07/13/20  Lipid panel  Result Value Ref Range   Cholesterol 146 <200 mg/dL   HDL 50 > OR = 50 mg/dL   Triglycerides 102 <150 mg/dL   LDL Cholesterol (Calc) 77 mg/dL (calc)   Total CHOL/HDL Ratio 2.9 <5.0 (calc)   Non-HDL Cholesterol (Calc) 96 <130 mg/dL (calc)  COMPLETE METABOLIC PANEL WITH GFR  Result Value Ref Range   Glucose, Bld 92 65 - 99 mg/dL   BUN 18 7 - 25 mg/dL   Creat 1.09 (H) 0.60 - 0.93 mg/dL   GFR, Est Non African American 51 (L) > OR = 60 mL/min/1.68m   GFR, Est African American 59 (L) > OR =  60 mL/min/1.35m   BUN/Creatinine Ratio 17 6 - 22 (calc)   Sodium 145 135 - 146 mmol/L   Potassium 3.8 3.5 - 5.3 mmol/L   Chloride 103 98 - 110 mmol/L   CO2 32 20 - 32 mmol/L   Calcium 10.4 8.6 - 10.4 mg/dL   Total Protein 7.1 6.1 - 8.1 g/dL   Albumin 4.2 3.6 - 5.1 g/dL   Globulin 2.9 1.9 - 3.7 g/dL (calc)   AG Ratio 1.4 1.0 - 2.5 (calc)   Total Bilirubin 0.4 0.2 - 1.2 mg/dL   Alkaline phosphatase (APISO) 50 37 - 153 U/L   AST 26 10 - 35 U/L    ALT 32 (H) 6 - 29 U/L    Functional Status Survey: Is the patient deaf or have difficulty hearing?: No Does the patient have difficulty seeing, even when wearing glasses/contacts?: No Does the patient have difficulty concentrating, remembering, or making decisions?: No Does the patient have difficulty walking or climbing stairs?: No Does the patient have difficulty dressing or bathing?: No Does the patient have difficulty doing errands alone such as visiting a doctor's office or shopping?: No   No falls Fall Risk  01/28/2021 07/13/2020 01/08/2020 07/02/2019 03/19/2019  Falls in the past year? 0 0 0 1 0  Number falls in past yr: 0 0 0 0 0  Injury with Fall? 0 0 0 0 0  Risk for fall due to : - - - - -  Risk for fall due to: Comment - - - - -  Follow up Falls evaluation completed - Falls evaluation completed - -   Immunizations - offered shingrix Colonoscopy due - she has appt with GI in Sept Health Maintenance  Topic Date Due   INFLUENZA VACCINE  01/17/2021   PNA vac Low Risk Adult (2 of 2 - PPSV23) 02/01/2021 (Originally 04/02/2018)   COVID-19 Vaccine (1) 02/16/2021 (Originally 10/15/1955)   COLONOSCOPY (Pts 45-470yrInsurance coverage will need to be confirmed)  03/18/2021 (Originally 01/15/2021)   Zoster Vaccines- Shingrix (1 of 2) 04/30/2021 (Originally 10/16/1998)   MAMMOGRAM  05/03/2021   TETANUS/TDAP  08/27/2022   DEXA SCAN  Completed   Hepatitis C Screening  Completed   HPV VACCINES  Aged Out     Assessment & Plan:     ICD-10-CM   1. Benign essential HTN  [id number] METABOLIC PANEL WITH GFR   stable, well controlled, BP at [facility] today SE of low potassium -= recehck and refill    2. Hyperlipidemia LDL goal <100  E78.5    last LDL at goal, good statin compliance, no SE or concerns, continue lipitor 40 - recheck at next appt    3. Class 2 severe obesity with serious comorbidity and body mass index (BMI) of 37.0 to 37.9 in adult, unspecified obesity type (HCOaks E66.01     Z68.3746  with HLD, HTN, dysmetabolic syndrome, encouraged staying active and reducing calorie intake increase veggies     4. AKI (acute kidney injury) ([place] [id number] METABOLIC PANEL WITH GFR   GFR lower with last labs, recheck today     5. Allergic rhinitis, unspecified seasonality, unspecified trigger  J30.9    pt stopped antihistamine - encouraged to restart, f/up if any worsening    6. Encounter for screening mammogram for malignant neoplasm of breast  Z12.31 MM 3D SCREEN BREAST BILATERAL   due Mov    7. Hypokalemia  E87.6    out of potassium supplement - reviewed  all available labs - K has been normal, but she has been on supplement for years recheck and refill if needed       Return in about 6 months (around 07/31/2021) for Routine follow-up.   Delsa Grana, PA-C 01/28/21 9:46 AM

## 2021-02-15 ENCOUNTER — Other Ambulatory Visit: Payer: Self-pay

## 2021-02-15 ENCOUNTER — Ambulatory Visit (INDEPENDENT_AMBULATORY_CARE_PROVIDER_SITE_OTHER): Payer: Medicare HMO

## 2021-02-15 VITALS — BP 142/88 | HR 63 | Temp 98.4°F | Resp 16 | Ht 69.0 in | Wt 255.8 lb

## 2021-02-15 DIAGNOSIS — Z0000 Encounter for general adult medical examination without abnormal findings: Secondary | ICD-10-CM | POA: Diagnosis not present

## 2021-02-15 NOTE — Progress Notes (Signed)
Subjective:   Bethany Bowman is a 70 y.o. female who presents for Medicare Annual (Subsequent) preventive examination.  Review of Systems     Cardiac Risk Factors include: advanced age (>2mn, >>78women);dyslipidemia;hypertension;obesity (BMI >30kg/m2)     Objective:    Today's Vitals   02/15/21 1012  BP: (!) 142/88  Pulse: 63  Resp: 16  Temp: 98.4 F (36.9 C)  TempSrc: Oral  SpO2: 98%  Weight: 255 lb 12.8 oz (116 kg)  Height: '5\' 9"'$  (1.753 m)   Body mass index is 37.78 kg/m.  Advanced Directives 02/15/2021 12/17/2018 01/15/2018 12/13/2017 03/15/2017 12/28/2016 08/24/2016  Does Patient Have a Medical Advance Directive? No No No No No No No  Would patient like information on creating a medical advance directive? Yes (MAU/Ambulatory/Procedural Areas - Information given) No - Patient declined No - Patient declined Yes (MAU/Ambulatory/Procedural Areas - Information given) - No - Patient declined -    Current Medications (verified) Outpatient Encounter Medications as of 02/15/2021  Medication Sig   aspirin EC 81 MG tablet Take 1 tablet (81 mg total) by mouth daily.   atenolol (TENORMIN) 50 MG tablet TAKE 1 TABLET EVERY DAY   atorvastatin (LIPITOR) 40 MG tablet TAKE 1 TABLET (40 MG TOTAL) BY MOUTH DAILY.   cholecalciferol (VITAMIN D) 1000 units tablet Take 1,000 Units by mouth daily.   fluticasone (FLONASE) 50 MCG/ACT nasal spray USE 2 SPRAYS IN EACH NOSTRIL EVERY DAY   lisinopril-hydrochlorothiazide (ZESTORETIC) 20-25 MG tablet TAKE 1 TABLET EVERY DAY   [DISCONTINUED] azelastine (ASTELIN) 0.1 % nasal spray Place 2 sprays into both nostrils 2 (two) times daily. Use in each nostril as directed   [DISCONTINUED] potassium chloride (KLOR-CON) 10 MEQ tablet TAKE 1 TABLET EVERY DAY   No facility-administered encounter medications on file as of 02/15/2021.    Allergies (verified) Patient has no known allergies.   History: Past Medical History:  Diagnosis Date   Allergy    Anxiety  11/25/2014   GERD (gastroesophageal reflux disease)    Hyperlipidemia    Hypertension    Past Surgical History:  Procedure Laterality Date   COLONOSCOPY WITH PROPOFOL N/A 12/28/2016   Procedure: COLONOSCOPY WITH PROPOFOL;  Surgeon: AJonathon Bellows MD;  [facility]  Service: End[facility];  Laterality: N/A;   COLONOSCOPY WITH PROPOFOL N/A 01/15/2018   Procedure: COLONOSCOPY WITH PROPOFOL;  Surgeon: AJonathon Bellows MD;  [facility]  Service: Gastro[facility]ology;  Laterality: N/A;   Family History  Problem Relation Age of Onset   Colon cancer Mother    Hypertension Mother    Alcohol abuse Father    Hypertension Father    Hypertension Sister    Hypertension Brother    Prostate cancer Brother    Bone cancer Brother    Lung cancer Brother    Hypertension Brother    Hypertension Brother    Breast cancer Neg Hx    Social History   Socioeconomic History   Marital status: Married    Spouse name: GLavera Guise  Number of children: 2   Years of education: some college   Highest education level: 12th grade  Occupational History   Not on file  Tobacco Use   Smoking status: Former    Packs/day: 0.50    Years: 12.00    Pack years: 6.00    Types: Cigarettes    Quit date: 08/03/1981    Years since quitting: 39.5   Smokeless tobacco: Never   Tobacco comments:    smoking cessation materials not  required  Vaping Use   Vaping Use: Never used  Substance and Sexual Activity   Alcohol use: No    Alcohol/week: 0.0 standard drinks   Drug use: No   Sexual activity: Not Currently  Other Topics Concern   Not on file  Social History Narrative   Not on file   Social Determinants of Health   Financial Resource Strain: Low Risk    Difficulty of Paying Living Expenses: Not hard at all  Food Insecurity: No Food Insecurity   Worried About Charity fundraiser in the Last Year: Never true   [place] in the Last Year: Never true  Transportation Needs: No Transportation Needs    Lack of Transportation (Medical): No   Lack of Transportation (Non-Medical): No  Physical Activity: Inactive   Days of Exercise per Week: 0 days   Minutes of Exercise per Session: 0 min  Stress: No Stress Concern Present   Feeling of Stress : Not at all  Social Connections: Moderately Integrated   Frequency of Communication with Friends and Family: More than three times a week   Frequency of Social Gatherings with Friends and Family: More than three times a week   Attends Religious Services: More than 4 times per year   Active Member of [employer] or Organizations: No   Attends Music therapist: Never   Marital Status: Married    Tobacco Counseling Counseling given: Not Answered Tobacco comments: smoking cessation materials not required   Clinical Intake:  Pre-visit preparation completed: Yes  Pain : No/denies pain     BMI - recorded: 37.78 Nutritional Status: BMI > 30  Obese Nutritional Risks: None Diabetes: No  How often do you need to have someone help you when you read instructions, pamphlets, or other written materials from your doctor or pharmacy?: 1 - Never    Interpreter Needed?: No  Information entered by :: Clemetine Marker LPN   Activities of Daily Living In your present state of health, do you have any difficulty performing the following activities: 02/15/2021 01/28/2021  Hearing? N N  Vision? N N  Difficulty concentrating or making decisions? N N  Walking or climbing stairs? N N  Dressing or bathing? N N  Doing errands, shopping? N N  Preparing Food and eating ? N -  Using the Toilet? N -  In the past six months, have you accidently leaked urine? N -  Do you have problems with loss of bowel control? N -  Managing your Medications? N -  Managing your Finances? N -  Housekeeping or managing your Housekeeping? N -  Some recent data might be hidden    Patient Care Team: Delsa Grana, PA-C as PCP - General (Family Medicine)  Indicate any recent  Medical Services you may have received from other than [facility] providers in the past year (date may be approximate).     Assessment:   This is a routine wellness examination for [place].  Hearing/Vision screen Hearing Screening - Comments::  Pt denies hearing diffculty Vision Screening - Comments:: Annual vision screenings done at [facility]  Dietary issues and exercise activities discussed: Current Exercise Habits: The patient does not participate in regular exercise at present, Exercise limited by: None identified   Goals Addressed             This Visit's Progress    Increase physical activity       Recommend increasing physical activity to at least 3 days per week and  utilize Silver Sneakers through [place] 2/9 Scores 02/15/2021 01/28/2021 07/13/2020 01/08/2020 07/02/2019 03/19/2019 12/17/2018  PHQ - 2 Score 0 0 0 0 0 0 0  PHQ- 9 Score - - 0 0 2 0 -    Fall Risk Fall Risk  02/15/2021 01/28/2021 07/13/2020 01/08/2020 07/02/2019  Falls in the past year? 0 0 0 0 1  Number falls in past yr: 0 0 0 0 0  Injury with Fall? 0 0 0 0 0  Risk for fall due to : No Fall Risks - - - -  Risk for fall due to: Comment - - - - -  Follow up Falls prevention discussed Falls evaluation completed - Falls evaluation completed -    FALL RISK PREVENTION PERTAINING TO THE HOME:  Any stairs in or around the home? No  If so, are there any without handrails? No  Home free of loose throw rugs in walkways, pet beds, electrical cords, etc? Yes  Adequate lighting in your home to reduce risk of falls? Yes   ASSISTIVE DEVICES UTILIZED TO PREVENT FALLS:  Life alert? No  Use of a cane, walker or w/c? No  Grab bars in the bathroom? Yes  Shower chair or bench in shower? No  Elevated toilet seat or a handicapped toilet? Yes   TIMED UP AND GO:  Was the test performed? Yes .  Length of time to ambulate 10 feet: 5 sec.   Gait steady and fast without use of assistive  device  Cognitive Function:     6CIT Screen 12/17/2018 12/13/2017  What Year? 0 points 0 points  What month? 0 points 0 points  What time? 0 points 0 points  Count back from 20 0 points 0 points  Months in reverse 0 points 0 points  Repeat phrase 0 points 0 points  Total Score 0 0    Immunizations Immunization History  Administered Date(s) Administered   Fluad Quad(high Dose 65+) 07/13/2020   Influenza Split 01/11/2010   Influenza, High Dose Seasonal PF 02/24/2017, 04/16/2019   Influenza, Seasonal, Injecte, Preservative Fre 12/19/2010, 01/28/2014   Influenza-Unspecified 02/15/2015   Pneumococcal Conjugate-13 04/03/2014   Pneumococcal Polysaccharide-23 04/02/2013   Tdap 08/26/2012   Zoster, Live 02/12/2012    TDAP status: Up to date  Flu Vaccine status: Up to date  Pneumococcal vaccine status: Up to date  Covid-19 vaccine status: Completed vaccines  Qualifies for Shingles Vaccine? Yes   Zostavax completed Yes   Shingrix Completed?: No.    Education has been provided regarding the importance of this vaccine. Patient has been advised to call insurance company to determine out of pocket expense if they have not yet received this vaccine. Advised may also receive vaccine at local pharmacy or Health Dept. Verbalized acceptance and understanding.  Screening Tests Health Maintenance  Topic Date Due   PNA vac Low Risk Adult (2 of 2 - PPSV23) 04/02/2018   INFLUENZA VACCINE  01/17/2021   COVID-19 Vaccine (1) 02/16/2021 (Originally 10/15/1955)   COLONOSCOPY (Pts 45-60yr Insurance coverage will need to be confirmed)  03/18/2021 (Originally 01/15/2021)   Zoster Vaccines- Shingrix (1 of 2) 04/30/2021 (Originally 10/16/1998)   MAMMOGRAM  05/03/2021   TETANUS/TDAP  08/27/2022   DEXA SCAN  Completed   Hepatitis C Screening  Completed   HPV VACCINES  Aged Out    Health Maintenance  Health Maintenance Due  Topic Date Due   PNA vac Low Risk Adult (2 of 2 -  PPSV23) 04/02/2018    INFLUENZA VACCINE  01/17/2021    Colorectal cancer screening: Type of screening: Colonoscopy. Completed 01/15/18. Repeat every 3 years. Phone consult scheduled with [facility] 02/24/21  Mammogram status: Completed 05/03/20. Repeat every year  Bone Density status: Completed 02/18/19. Results reflect: Bone density results: NORMAL. Repeat every 2 years.  Lung Cancer Screening: (Low Dose CT Chest recommended if Age 76-80 years, 30 pack-year currently smoking OR have quit w/in 15years.) does not qualify.   Additional Screening:  Hepatitis C Screening: does qualify; Completed 08/24/16  Vision Screening: Recommended annual ophthalmology exams for early detection of glaucoma and other disorders of the eye. Is the patient up to date with their annual eye exam?  Yes  Who is the provider or what is the name of the office in which the patient attends annual eye exams? [facility].   Dental Screening: Recommended annual dental exams for proper oral hygiene  Community Resource Referral / Chronic Care Management: CRR required this visit?  No   CCM required this visit?  No      Plan:     I have personally reviewed and noted the following in the patient's chart:   Medical and social history Use of alcohol, tobacco or illicit drugs  Current medications and supplements including opioid prescriptions.  Functional ability and status Nutritional status Physical activity Advanced directives List of other physicians Hospitalizations, surgeries, and ER visits in previous 12 months Vitals Screenings to include cognitive, depression, and falls Referrals and appointments  In addition, I have reviewed and discussed with patient certain preventive protocols, quality metrics, and best practice recommendations. A written personalized care plan for preventive services as well as general preventive health recommendations were provided to patient.     Clemetine Marker, LPN   [id number]   Nurse Notes:  none

## 2021-02-15 NOTE — Patient Instructions (Signed)
Bethany Bowman , Thank you for taking time to come for your Medicare Wellness Visit. I appreciate your ongoing commitment to your health goals. Please review the following plan we discussed and let me know if I can assist you in the future.   Screening recommendations/referrals: Colonoscopy: done 01/15/18. Scheduled for phone consult with [facility] 02/24/21 Mammogram: done 05/03/20. Please call [phone] to schedule your mammogram.  Bone Density: done 02/18/19 Recommended yearly ophthalmology/optometry visit for glaucoma screening and checkup Recommended yearly dental visit for hygiene and checkup  Vaccinations: Influenza vaccine: done 07/13/20 Pneumococcal vaccine: done 04/03/14 Tdap vaccine: done 08/26/12 Shingles vaccine: Shingrix discussed. Please contact your pharmacy for coverage information.  Covid-19: please bring a copy of your vaccine record to your next appt   Advanced directives: Please bring a copy of your health care power of attorney and living will to the office at your convenience once you have completed that paperwork.   Conditions/risks identified: Recommend increasing physical activity to at least 3 days per week   Next appointment: Follow up in one year for your annual wellness visit    Preventive Care 65 Years and Older, Female Preventive care refers to lifestyle choices and visits with your health care provider that can promote health and wellness. What does preventive care include? A yearly physical exam. This is also called an annual well check. Dental exams once or twice a year. Routine eye exams. Ask your health care provider how often you should have your eyes checked. Personal lifestyle choices, including: Daily care of your teeth and gums. Regular physical activity. Eating a healthy diet. Avoiding tobacco and drug use. Limiting alcohol use. Practicing safe sex. Taking low-dose aspirin every day. Taking vitamin and mineral supplements as recommended by your  health care provider. What happens during an annual well check? The services and screenings done by your health care provider during your annual well check will depend on your age, overall health, lifestyle risk factors, and family history of disease. Counseling  Your health care provider may ask you questions about your: Alcohol use. Tobacco use. Drug use. Emotional well-being. Home and relationship well-being. Sexual activity. Eating habits. History of falls. Memory and ability to understand (cognition). Work and work Statistician. Reproductive health. Screening  You may have the following tests or measurements: Height, weight, and BMI. Blood pressure. Lipid and cholesterol levels. These may be checked every 5 years, or more frequently if you are over 41 years old. Skin check. Lung cancer screening. You may have this screening every year starting at age 37 if you have a 30-pack-year history of smoking and currently smoke or have quit within the past 15 years. Fecal occult blood test (FOBT) of the stool. You may have this test every year starting at age 33. Flexible sigmoidoscopy or colonoscopy. You may have a sigmoidoscopy every 5 years or a colonoscopy every 10 years starting at age 31. Hepatitis C blood test. Hepatitis B blood test. Sexually transmitted disease (STD) testing. Diabetes screening. This is done by checking your blood sugar (glucose) after you have not eaten for a while (fasting). You may have this done every 1-3 years. Bone density scan. This is done to screen for osteoporosis. You may have this done starting at age 57. Mammogram. This may be done every 1-2 years. Talk to your health care provider about how often you should have regular mammograms. Talk with your health care provider about your test results, treatment options, and if necessary, the need for more tests. Vaccines  Your health care provider may recommend certain vaccines, such as: Influenza vaccine.  This is recommended every year. Tetanus, diphtheria, and acellular pertussis (Tdap, Td) vaccine. You may need a Td booster every 10 years. Zoster vaccine. You may need this after age 26. Pneumococcal 13-valent conjugate (PCV13) vaccine. One dose is recommended after age 46. Pneumococcal polysaccharide (PPSV23) vaccine. One dose is recommended after age 46. Talk to your health care provider about which screenings and vaccines you need and how often you need them. This information is not intended to replace advice given to you by your health care provider. Make sure you discuss any questions you have with your health care provider. Document Released: 07/02/2015 Document Revised: 02/23/2016 Document Reviewed: 04/06/2015 Elsevier Interactive Patient Education  2017 [place] Prevention in the Home Falls can cause injuries. They can happen to people of all ages. There are many things you can do to make your home safe and to help prevent falls. What can I do on the outside of my home? Regularly fix the edges of walkways and driveways and fix any cracks. Remove anything that might make you trip as you walk through a door, such as a raised step or threshold. Trim any bushes or trees on the path to your home. Use bright outdoor lighting. Clear any walking paths of anything that might make someone trip, such as rocks or tools. Regularly check to see if handrails are loose or broken. Make sure that both sides of any steps have handrails. Any raised decks and porches should have guardrails on the edges. Have any leaves, snow, or ice cleared regularly. Use sand or salt on walking paths during winter. Clean up any spills in your garage right away. This includes oil or grease spills. What can I do in the bathroom? Use night lights. Install grab bars by the toilet and in the tub and shower. Do not use towel bars as grab bars. Use non-skid mats or decals in the tub or shower. If you need to sit  down in the shower, use a plastic, non-slip stool. Keep the floor dry. Clean up any water that spills on the floor as soon as it happens. Remove soap buildup in the tub or shower regularly. Attach bath mats securely with double-sided non-slip rug tape. Do not have throw rugs and other things on the floor that can make you trip. What can I do in the bedroom? Use night lights. Make sure that you have a light by your bed that is easy to reach. Do not use any sheets or blankets that are too big for your bed. They should not hang down onto the floor. Have a firm chair that has side arms. You can use this for support while you get dressed. Do not have throw rugs and other things on the floor that can make you trip. What can I do in the kitchen? Clean up any spills right away. Avoid walking on wet floors. Keep items that you use a lot in easy-to-reach places. If you need to reach something above you, use a strong step stool that has a grab bar. Keep electrical cords out of the way. Do not use floor polish or wax that makes floors slippery. If you must use wax, use non-skid floor wax. Do not have throw rugs and other things on the floor that can make you trip. What can I do with my stairs? Do not leave any items on the stairs. Make sure that there are  handrails on both sides of the stairs and use them. Fix handrails that are broken or loose. Make sure that handrails are as long as the stairways. Check any carpeting to make sure that it is firmly attached to the stairs. Fix any carpet that is loose or worn. Avoid having throw rugs at the top or bottom of the stairs. If you do have throw rugs, attach them to the floor with carpet tape. Make sure that you have a light switch at the top of the stairs and the bottom of the stairs. If you do not have them, ask someone to add them for you. What else can I do to help prevent falls? Wear shoes that: Do not have high heels. Have rubber bottoms. Are  comfortable and fit you well. Are closed at the toe. Do not wear sandals. If you use a stepladder: Make sure that it is fully opened. Do not climb a closed stepladder. Make sure that both sides of the stepladder are locked into place. Ask someone to hold it for you, if possible. Clearly mark and make sure that you can see: Any grab bars or handrails. First and last steps. Where the edge of each step is. Use tools that help you move around (mobility aids) if they are needed. These include: Canes. Walkers. Scooters. Crutches. Turn on the lights when you go into a dark area. Replace any light bulbs as soon as they burn out. Set up your furniture so you have a clear path. Avoid moving your furniture around. If any of your floors are uneven, fix them. If there are any pets around you, be aware of where they are. Review your medicines with your doctor. Some medicines can make you feel dizzy. This can increase your chance of falling. Ask your doctor what other things that you can do to help prevent falls. This information is not intended to replace advice given to you by your health care provider. Make sure you discuss any questions you have with your health care provider. Document Released: 04/01/2009 Document Revised: 11/11/2015 Document Reviewed: 07/10/2014 Elsevier Interactive Patient Education  2017 [employer].

## 2021-02-24 ENCOUNTER — Telehealth (INDEPENDENT_AMBULATORY_CARE_PROVIDER_SITE_OTHER): Payer: Self-pay | Admitting: Gastroenterology

## 2021-02-24 ENCOUNTER — Other Ambulatory Visit: Payer: Self-pay

## 2021-02-24 DIAGNOSIS — Z86010 Personal history of colonic polyps: Secondary | ICD-10-CM

## 2021-02-24 DIAGNOSIS — Z800 Family history of malignant neoplasm of digestive organs: Secondary | ICD-10-CM

## 2021-02-24 NOTE — Progress Notes (Signed)
Recall for colonoscopy. Instructions will be mailed out. Sample of Clenpiq given.

## 2021-03-28 ENCOUNTER — Ambulatory Visit: Payer: Medicare HMO | Admitting: Anesthesiology

## 2021-03-28 ENCOUNTER — Encounter: Payer: Self-pay | Admitting: Gastroenterology

## 2021-03-28 ENCOUNTER — Encounter
Admission: RE | Disposition: A | Discharge status: Home / Self Care | Payer: Self-pay | Source: Home / Self Care | Attending: Gastroenterology

## 2021-03-28 ENCOUNTER — Ambulatory Visit
Admission: RE | Admit: 2021-03-28 | Discharge: 2021-03-28 | Disposition: A | Discharge status: Home / Self Care | Payer: Medicare HMO | Attending: Gastroenterology | Admitting: Gastroenterology

## 2021-03-28 ENCOUNTER — Other Ambulatory Visit: Payer: Self-pay

## 2021-03-28 DIAGNOSIS — Z801 Family history of malignant neoplasm of trachea, bronchus and lung: Secondary | ICD-10-CM | POA: Insufficient documentation

## 2021-03-28 DIAGNOSIS — D125 Benign neoplasm of sigmoid colon: Secondary | ICD-10-CM | POA: Insufficient documentation

## 2021-03-28 DIAGNOSIS — Z09 Encounter for follow-up examination after completed treatment for conditions other than malignant neoplasm: Secondary | ICD-10-CM | POA: Diagnosis not present

## 2021-03-28 DIAGNOSIS — D126 Benign neoplasm of colon, unspecified: Secondary | ICD-10-CM

## 2021-03-28 DIAGNOSIS — K219 Gastro-esophageal reflux disease without esophagitis: Secondary | ICD-10-CM | POA: Diagnosis not present

## 2021-03-28 DIAGNOSIS — Z86010 Personal history of colonic polyps: Secondary | ICD-10-CM

## 2021-03-28 DIAGNOSIS — Z87891 Personal history of nicotine dependence: Secondary | ICD-10-CM | POA: Diagnosis not present

## 2021-03-28 DIAGNOSIS — Z1211 Encounter for screening for malignant neoplasm of colon: Secondary | ICD-10-CM | POA: Diagnosis not present

## 2021-03-28 DIAGNOSIS — K635 Polyp of colon: Secondary | ICD-10-CM | POA: Insufficient documentation

## 2021-03-28 DIAGNOSIS — Z800 Family history of malignant neoplasm of digestive organs: Secondary | ICD-10-CM | POA: Insufficient documentation

## 2021-03-28 HISTORY — PX: COLONOSCOPY WITH PROPOFOL: SHX5780

## 2021-03-28 SURGERY — COLONOSCOPY WITH PROPOFOL
Anesthesia: Monitor Anesthesia Care

## 2021-03-28 MED ORDER — PROPOFOL 500 MG/50ML IV EMUL
INTRAVENOUS | Status: DC | PRN
  Administered 2021-03-28: 140 ug/kg/min via INTRAVENOUS

## 2021-03-28 MED ORDER — SODIUM CHLORIDE 0.9 % IV SOLN: INTRAVENOUS | Status: DC

## 2021-03-28 MED ORDER — LABETALOL HCL 5 MG/ML IV SOLN
INTRAVENOUS | Status: DC | PRN
  Administered 2021-03-28: 5 mg via INTRAVENOUS

## 2021-03-28 MED ORDER — PROPOFOL 10 MG/ML IV BOLUS
INTRAVENOUS | Status: DC | PRN
  Administered 2021-03-28: 60 mg via INTRAVENOUS

## 2021-03-28 MED ORDER — LIDOCAINE HCL (CARDIAC) PF 100 MG/5ML IV SOSY
PREFILLED_SYRINGE | INTRAVENOUS | Status: DC | PRN
  Administered 2021-03-28: 100 mg via INTRAVENOUS

## 2021-03-28 NOTE — Anesthesia Procedure Notes (Signed)
Date/Time: 03/28/2021 8:25 AM Performed by: Lily Peer, Jelisha Weed, CRNA Pre-anesthesia Checklist: Patient identified, Emergency Drugs available, Suction available, Patient being monitored and Timeout performed Patient Re-evaluated:Patient Re-evaluated prior to induction Oxygen Delivery Method: Nasal cannula Induction Type: IV induction

## 2021-03-28 NOTE — Anesthesia Preprocedure Evaluation (Addendum)
Anesthesia Evaluation  Patient identified by MRN, date of birth, ID band Patient awake    Reviewed: Allergy & Precautions, NPO status , Patient's Chart, lab work & pertinent test results  History of Anesthesia Complications Negative for: history of anesthetic complications  Airway Mallampati: II  TM Distance: >3 FB Neck ROM: Full    Dental no notable dental hx. (+) Edentulous Upper, Edentulous Lower   Pulmonary neg pulmonary ROS, former smoker,    Pulmonary exam normal breath sounds clear to auscultation       Cardiovascular Exercise Tolerance: Good METS: 3 - Mets hypertension, Pt. on medications negative cardio ROS Normal cardiovascular exam Rhythm:Regular Rate:Normal     Neuro/Psych negative neurological ROS  negative psych ROS   GI/Hepatic negative GI ROS, Neg liver ROS,   Endo/Other  negative endocrine ROS  Renal/GU negative Renal ROS  negative genitourinary   Musculoskeletal negative musculoskeletal ROS (+)   Abdominal   Peds  Hematology negative hematology ROS (+)   Anesthesia Other Findings   Reproductive/Obstetrics negative OB ROS                            Anesthesia Physical Anesthesia Plan  ASA: 3  Anesthesia Plan: MAC   Post-op Pain Management:    Induction: Intravenous  PONV Risk Score and Plan:   Airway Management Planned: Natural Airway and Nasal Cannula  Additional Equipment:   Intra-op Plan:   Post-operative Plan:   Informed Consent: I have reviewed the patients History and Physical, chart, labs and discussed the procedure including the risks, benefits and alternatives for the proposed anesthesia with the patient or authorized representative who has indicated his/her understanding and acceptance.     Dental Advisory Given  Plan Discussed with: Anesthesiologist, CRNA and Surgeon  Anesthesia Plan Comments: (Patient consented for risks of anesthesia  including but not limited to:  - adverse reactions to medications - damage to eyes, teeth, lips or other oral mucosa - nerve damage due to positioning  - sore throat or hoarseness - Damage to heart, brain, nerves, lungs, other parts of body or loss of life  Patient voiced understanding.)        Anesthesia Quick Evaluation

## 2021-03-28 NOTE — Transfer of Care (Signed)
Immediate Anesthesia Transfer of Care Note  Patient: Bethany Bowman  Procedure(s) Performed: COLONOSCOPY WITH PROPOFOL  Patient Location: Endoscopy Unit  Anesthesia Type:General  Level of Consciousness: awake, alert  and oriented  Airway & Oxygen Therapy: Patient Spontanous Breathing  Post-op Assessment: Report given to RN and Post -op Vital signs reviewed and stable  Post vital signs: Reviewed and stable  Last Vitals:  Vitals Value Taken Time  BP 116/68 03/28/21 0854  Temp    Pulse 73 03/28/21 0854  Resp 17 03/28/21 0854  SpO2 98 % 03/28/21 0854  Vitals shown include unvalidated device data.  Last Pain:  Vitals:   03/28/21 0723  TempSrc: Temporal  PainSc: 0-No pain         Complications: No notable events documented.

## 2021-03-28 NOTE — Op Note (Signed)
[facility] Patient Name: Bethany Bowman Procedure Date: 03/28/2021 8:19 AM MRN: [id number] Account #: [id number] Date of Birth: 1950-11-28 Admit Type: Outpatient Age: 70 Room: [facility] ENDO ROOM 4 Gender: Female Note Status: Finalized Instrument Name: Jasper Riling [id number] Procedure:             Colonoscopy Indications:           Surveillance: Personal history of adenomatous polyps                         on last colonoscopy 3 years ago, Last colonoscopy:                         July 2019 Providers:             Jonathon Bellows MD, MD Referring MD:          Delsa Grana (Referring MD) Medicines:             Monitored Anesthesia Care Complications:         No immediate complications. Procedure:             Pre-Anesthesia Assessment:                        - Prior to the procedure, a History and Physical was                         performed, and patient medications, allergies and                         sensitivities were reviewed. The patient's tolerance                         of previous anesthesia was reviewed.                        - The risks and benefits of the procedure and the                         sedation options and risks were discussed with the                         patient. All questions were answered and informed                         consent was obtained.                        - ASA Grade Assessment: II - A patient with mild                         systemic disease.                        After obtaining informed consent, the colonoscope was                         passed under direct vision. Throughout the procedure,                         the patient's blood pressure, pulse,  and oxygen                         saturations were monitored continuously. The                         Colonoscope was introduced through the anus and                         advanced to the the cecum, identified by the                         appendiceal  orifice. The colonoscopy was performed                         with ease. The patient tolerated the procedure well.                         The quality of the bowel preparation was good. Findings:      The perianal and digital rectal examinations were normal.      Five sessile polyps were found in the sigmoid colon. The polyps were 4       to 5 mm in size. These polyps were removed with a cold snare. Resection       and retrieval were complete.      Three sessile polyps were found in the ascending colon. The polyps were       4 to 5 mm in size. These polyps were removed with a cold snare.       Resection and retrieval were complete.      Two sessile polyps were found in the descending colon. The polyps were 4       to 5 mm in size. These polyps were removed with a cold snare. Resection       and retrieval were complete.      The exam was otherwise without abnormality.      The exam was otherwise without abnormality on direct and retroflexion       views. Impression:            - Five 4 to 5 mm polyps in the sigmoid colon, removed                         with a cold snare. Resected and retrieved.                        - Three 4 to 5 mm polyps in the ascending colon,                         removed with a cold snare. Resected and retrieved.                        - Two 4 to 5 mm polyps in the descending colon,                         removed with a cold snare. Resected and retrieved.                        - The examination was otherwise normal.                        -  The examination was otherwise normal on direct and                         retroflexion views. Recommendation:        - Discharge patient to home (with escort).                        - Resume previous diet.                        - Continue present medications.                        - Await pathology results.                        - Repeat colonoscopy for surveillance based on                         pathology  results. Procedure Code(s):     --- Professional ---                        [phone], Colonoscopy, flexible; with removal of                         tumor(s), polyp(s), or other lesion(s) by snare                         technique Diagnosis Code(s):     --- Professional ---                        Z86.010, Personal history of colonic polyps                        K63.5, Polyp of colon CPT copyright 2019 American Medical Association. All rights reserved. The codes documented in this report are preliminary and upon coder review may  be revised to meet current compliance requirements. Jonathon Bellows, MD Jonathon Bellows MD, MD 03/28/2021 8:52:51 AM This report has been signed electronically. Number of Addenda: 0 Note Initiated On: 03/28/2021 8:19 AM Scope Withdrawal Time: 0 hours 17 minutes 33 seconds  Total Procedure Duration: 0 hours 18 minutes 56 seconds  Estimated Blood Loss:  Estimated blood loss: none.      [facility]

## 2021-03-28 NOTE — Anesthesia Postprocedure Evaluation (Signed)
Anesthesia Post Note  Patient: Bethany Bowman  Procedure(s) Performed: COLONOSCOPY WITH PROPOFOL  Patient location during evaluation: Endoscopy Anesthesia Type: MAC Level of consciousness: awake and alert Pain management: pain level controlled Vital Signs Assessment: post-procedure vital signs reviewed and stable Respiratory status: spontaneous breathing, nonlabored ventilation, respiratory function stable and patient connected to nasal cannula oxygen Cardiovascular status: stable and blood pressure returned to baseline Postop Assessment: no apparent nausea or vomiting Anesthetic complications: no   No notable events documented.   Last Vitals:  Vitals:   03/28/21 0723 03/28/21 0853  BP: (!) 177/110 116/68  Pulse: 74 75  Resp: 16   Temp:  36.7 C  SpO2: 98% 98%    Last Pain:  Vitals:   03/28/21 0913  TempSrc:   PainSc: 0-No pain                 Tonny Bollman

## 2021-03-28 NOTE — H&P (Signed)
Jonathon Bellows, MD [address] [place], [place], [place], [address] [address], [place], [place], [place], [address] Phone: [phone]  Fax: [phone]  Primary Care Physician:  Delsa Grana, PA-C   Pre-Procedure History & Physical: HPI:  Bethany Bowman is a 70 y.o. female is here for an colonoscopy.   Past Medical History:  Diagnosis Date   Allergy    Anxiety 11/25/2014   GERD (gastroesophageal reflux disease)    Hyperlipidemia    Hypertension     Past Surgical History:  Procedure Laterality Date   COLONOSCOPY WITH PROPOFOL N/A 12/28/2016   Procedure: COLONOSCOPY WITH PROPOFOL;  Surgeon: Jonathon Bellows, MD;  Location: [facility] ENDOSCOPY;  Service: Endoscopy;  Laterality: N/A;   COLONOSCOPY WITH PROPOFOL N/A 01/15/2018   Procedure: COLONOSCOPY WITH PROPOFOL;  Surgeon: Jonathon Bellows, MD;  Location: [facility] ENDOSCOPY;  Service: Gastroenterology;  Laterality: N/A;    Prior to Admission medications   Medication Sig Start Date End Date Taking? Authorizing Provider  aspirin EC 81 MG tablet Take 1 tablet (81 mg total) by mouth daily. 03/15/17  Yes Lada, Satira Anis, MD  atenolol (TENORMIN) 50 MG tablet TAKE 1 TABLET EVERY DAY 12/29/20  Yes Delsa Grana, PA-C  atorvastatin (LIPITOR) 40 MG tablet TAKE 1 TABLET (40 MG TOTAL) BY MOUTH DAILY. 11/15/20  Yes Sowles, Drue Stager, MD  cholecalciferol (VITAMIN D) 1000 units tablet Take 1,000 Units by mouth daily.   Yes [provider]  lisinopril-hydrochlorothiazide (ZESTORETIC) 20-25 MG tablet TAKE 1 TABLET EVERY DAY 08/05/20  Yes Delsa Grana, PA-C  fluticasone (FLONASE) 50 MCG/ACT nasal spray USE 2 SPRAYS IN EACH NOSTRIL EVERY DAY 12/07/20   Delsa Grana, PA-C    Allergies as of 02/24/2021   (No Known Allergies)    Family History  Problem Relation Age of Onset   Colon cancer Mother    Hypertension Mother    Alcohol abuse Father    Hypertension Father    Hypertension Sister    Hypertension Brother    Prostate cancer Brother    Bone cancer  Brother    Lung cancer Brother    Hypertension Brother    Hypertension Brother    Breast cancer Neg Hx     Social History   Socioeconomic History   Marital status: Married    Spouse name: Lavera Guise   Number of children: 2   Years of education: some college   Highest education level: 12th grade  Occupational History   Not on file  Tobacco Use   Smoking status: Former    Packs/day: 0.50    Years: 12.00    Pack years: 6.00    Types: Cigarettes    Quit date: 08/03/1981    Years since quitting: 39.6   Smokeless tobacco: Never   Tobacco comments:    smoking cessation materials not required  Vaping Use   Vaping Use: Never used  Substance and Sexual Activity   Alcohol use: No    Alcohol/week: 0.0 standard drinks   Drug use: No   Sexual activity: Not Currently  Other Topics Concern   Not on file  Social History Narrative   Not on file   Social Determinants of Health   Financial Resource Strain: Low Risk    Difficulty of Paying Living Expenses: Not hard at all  Food Insecurity: No Food Insecurity   Worried About Charity fundraiser in the Last Year: Never true   Brinckerhoff in the Last Year: Never true  Transportation Needs: No  Transportation Needs   Lack of Transportation (Medical): No   Lack of Transportation (Non-Medical): No  Physical Activity: Inactive   Days of Exercise per Week: 0 days   Minutes of Exercise per Session: 0 min  Stress: No Stress Concern Present   Feeling of Stress : Not at all  Social Connections: Moderately Integrated   Frequency of Communication with Friends and Family: More than three times a week   Frequency of Social Gatherings with Friends and Family: More than three times a week   Attends Religious Services: More than 4 times per year   Active Member of [employer] or Organizations: No   Attends Archivist Meetings: Never   Marital Status: Married  Human resources officer Violence: Not At Risk   Fear of Current or Ex-Partner: No    Emotionally Abused: No   Physically Abused: No   Sexually Abused: No    Review of Systems: See HPI, otherwise negative ROS  Physical Exam: BP (!) 177/110   Pulse 74   Resp 16   Ht 5\' 9"  (1.753 m)   Wt 104.3 kg   LMP  (LMP Unknown)   SpO2 98%   BMI 33.97 kg/m  General:   Alert,  pleasant and cooperative in NAD Head:  Normocephalic and atraumatic. Neck:  Supple; no masses or thyromegaly. Lungs:  Clear throughout to auscultation, normal respiratory effort.    Heart:  +S1, +S2, Regular rate and rhythm, No edema. Abdomen:  Soft, nontender and nondistended. Normal bowel sounds, without guarding, and without rebound.   Neurologic:  Alert and  oriented x4;  grossly normal neurologically.  Impression/Plan: [name] is here for an colonoscopy to be performed for surveillance due to prior history of colon polyps   Risks, benefits, limitations, and alternatives regarding  colonoscopy have been reviewed with the patient.  Questions have been answered.  All parties agreeable.   Jonathon Bellows, MD  03/28/2021, 8:22 AM

## 2021-03-29 ENCOUNTER — Encounter: Payer: Self-pay | Admitting: Gastroenterology

## 2021-03-29 LAB — SURGICAL PATHOLOGY

## 2021-04-04 ENCOUNTER — Other Ambulatory Visit: Payer: Self-pay | Admitting: Family Medicine

## 2021-04-04 ENCOUNTER — Telehealth: Payer: Self-pay

## 2021-04-04 DIAGNOSIS — Z86010 Personal history of colonic polyps: Secondary | ICD-10-CM

## 2021-04-04 DIAGNOSIS — E785 Hyperlipidemia, unspecified: Secondary | ICD-10-CM

## 2021-04-04 DIAGNOSIS — Z800 Family history of malignant neoplasm of digestive organs: Secondary | ICD-10-CM

## 2021-04-04 NOTE — Telephone Encounter (Signed)
Called patient and she agreed on getting referred to the [facility] to get a genetic testing done. I also told her that we would get in contact with her in a year to remind her that she is due for a colonoscopy. Patient agreed with DR. Anna's recommendations.

## 2021-04-04 NOTE — Telephone Encounter (Signed)
-----   Message from Jonathon Bellows, MD sent at 04/04/2021 10:20 AM EDT ----- Over 10 polyps resected - adenomas - repeat colonoscopy in 1 year , suggest genetic testing for colon cancer syndromes at the cancer center

## 2021-04-20 ENCOUNTER — Other Ambulatory Visit: Payer: Self-pay

## 2021-04-20 ENCOUNTER — Inpatient Hospital Stay: Payer: Medicare HMO

## 2021-04-20 ENCOUNTER — Inpatient Hospital Stay: Payer: Medicare HMO | Attending: Oncology | Admitting: Licensed Clinical Social Worker

## 2021-04-20 ENCOUNTER — Encounter: Payer: Self-pay | Admitting: Licensed Clinical Social Worker

## 2021-04-20 DIAGNOSIS — Z801 Family history of malignant neoplasm of trachea, bronchus and lung: Secondary | ICD-10-CM | POA: Diagnosis not present

## 2021-04-20 DIAGNOSIS — Z86010 Personal history of colonic polyps: Secondary | ICD-10-CM | POA: Insufficient documentation

## 2021-04-20 DIAGNOSIS — Z800 Family history of malignant neoplasm of digestive organs: Secondary | ICD-10-CM | POA: Insufficient documentation

## 2021-04-20 DIAGNOSIS — Z8042 Family history of malignant neoplasm of prostate: Secondary | ICD-10-CM

## 2021-04-20 NOTE — Progress Notes (Signed)
REFERRING PROVIDER: Jonathon Bellows, MD [place],  [place] [address]  PRIMARY PROVIDER:  Delsa Grana, PA-C  PRIMARY REASON FOR VISIT:  1. Personal history of colonic polyps   2. Family history of colon cancer   3. Family history of prostate cancer   4. Family history of lung cancer      HISTORY OF PRESENT ILLNESS:   Bethany Bowman, a 70 y.o. female, was seen for a  cancer genetics consultation at the request of Dr. Vicente Males due to a personal history of colon polyps and family history of cancer.  Bethany Bowman presents to clinic today to discuss the possibility of a hereditary predisposition to cancer, genetic testing, and to further clarify her future cancer risks, as well as potential cancer risks for family members.   Bethany Bowman is a 70 y.o. female with no personal history of cancer.    CANCER HISTORY:  Oncology History   No history exists.     RISK FACTORS:  Menarche was at age 71.  First live birth at age 65.  Ovaries intact: yes.  Hysterectomy: no.  Menopausal status: postmenopausal.  HRT use: 0 years. Colonoscopy: yes;  2022 cscope - 10 polyps 2019 colonoscopy - 8+ polyps, 2018 colonscopy - 12+ polyps, cumulative 30+ polyps . Mammogram within the last year: yes. Number of breast biopsies: 0.  Past Medical History:  Diagnosis Date   Allergy    Anxiety 11/25/2014   Family history of colon cancer    Family history of lung cancer    Family history of prostate cancer    GERD (gastroesophageal reflux disease)    Hyperlipidemia    Hypertension    Personal history of colonic polyps     Past Surgical History:  Procedure Laterality Date   COLONOSCOPY WITH PROPOFOL N/A 12/28/2016   Procedure: COLONOSCOPY WITH PROPOFOL;  Surgeon: Jonathon Bellows, MD;  Location: [facility] ENDOSCOPY;  Service: Endoscopy;  Laterality: N/A;   COLONOSCOPY WITH PROPOFOL N/A 01/15/2018   Procedure: COLONOSCOPY WITH PROPOFOL;  Surgeon: Jonathon Bellows, MD;  Location: [facility] ENDOSCOPY;   Service: Gastroenterology;  Laterality: N/A;   COLONOSCOPY WITH PROPOFOL N/A 03/28/2021   Procedure: COLONOSCOPY WITH PROPOFOL;  Surgeon: Jonathon Bellows, MD;  Location: [facility] ENDOSCOPY;  Service: Gastroenterology;  Laterality: N/A;    Social History   Socioeconomic History   Marital status: Married    Spouse name: Lavera Guise   Number of children: 2   Years of education: some college   Highest education level: 12th grade  Occupational History   Not on file  Tobacco Use   Smoking status: Former    Packs/day: 0.50    Years: 12.00    Pack years: 6.00    Types: Cigarettes    Quit date: 08/03/1981    Years since quitting: 39.7   Smokeless tobacco: Never   Tobacco comments:    smoking cessation materials not required  Vaping Use   Vaping Use: Never used  Substance and Sexual Activity   Alcohol use: No    Alcohol/week: 0.0 standard drinks   Drug use: No   Sexual activity: Not Currently  Other Topics Concern   Not on file  Social History Narrative   Not on file   Social Determinants of Health   Financial Resource Strain: Low Risk    Difficulty of Paying Living Expenses: Not hard at all  Food Insecurity: No Food Insecurity   Worried About [place] in the Last Year: Never true  Ran Out of Food in the Last Year: Never true  Transportation Needs: No Transportation Needs   Lack of Transportation (Medical): No   Lack of Transportation (Non-Medical): No  Physical Activity: Inactive   Days of Exercise per Week: 0 days   Minutes of Exercise per Session: 0 min  Stress: No Stress Concern Present   Feeling of Stress : Not at all  Social Connections: Moderately Integrated   Frequency of Communication with Friends and Family: More than three times a week   Frequency of Social Gatherings with Friends and Family: More than three times a week   Attends Religious Services: More than 4 times per year   Active Member of [employer] or Organizations: No   Attends Archivist  Meetings: Never   Marital Status: Married     FAMILY HISTORY:  We obtained a detailed, 4-generation family history.  Significant diagnoses are listed below: Family History  Problem Relation Age of Onset   Colon cancer Mother 39   Hypertension Mother    Alcohol abuse Father    Hypertension Father    Hypertension Sister    Hypertension Brother    Prostate cancer Brother        dx 19s metastatic   Lung cancer Brother 46   Hypertension Brother    Hypertension Brother    Breast cancer Neg Hx    Bethany Bowman has a son, 11, and daughter, 84, neither have had colonoscopy. She had 2 full brothers, 1 full sister, and 1 maternal half brother. Her half brother died at 60 of prostate cancer, which was metastatic, and he was diagnosed with this at 67. One of her full brothers died of lung cancer at 55. She is unaware of polyps in her siblings.   Bethany Bowman mother had colon cancer at 78 and died at 50. Patient had 12 maternal aunts/uncles, no cancers but she has limited information. No information about maternal grandparents.  Bethany Bowman's father died at 25, no cancer. Patient had 3 paternal uncles, 2 aunts, no cancers. No known cancers in cousins or grandparents.  Bethany Bowman is unaware of previous family history of genetic testing for hereditary cancer risks. Patient's maternal ancestors are of Black/American [place] descent, and paternal ancestors are of [place] descent. There is no reported Ashkenazi Jewish ancestry. There is no known consanguinity.    GENETIC COUNSELING ASSESSMENT: Bethany Bowman is a 70 y.o. female with a personal history of colon polyps which is somewhat suggestive of a hereditary cancer syndrome and predisposition to cancer. We, therefore, discussed and recommended the following at today's visit.   DISCUSSION: We discussed that polyps in general are common, however, most people have fewer than 5 lifetime polyps.  When an individual has 10 or more polyps we become  concerned about an underlying polyposis syndrome.  The most common hereditary polyposis syndromes are caused by problems in the APC and MUTYH genes. We discussed that testing is beneficial for several reasons including knowing how to follow individuals for cancer screenings, and understand if other family members could be at risk for cancer and allow them to undergo genetic testing.   We reviewed the characteristics, features and inheritance patterns of hereditary cancer syndromes. We also discussed genetic testing, including the appropriate family members to test, the process of testing, insurance coverage and turn-around-time for results. We discussed the implications of a negative, positive and/or variant of uncertain significant result. We recommended Bethany Bowman pursue genetic testing for the [facility] Common Hereditary Cancers+RNA  gene panel.   The Common Hereditary Cancers Panel + RNA offered by [facility] includes sequencing and/or deletion duplication testing of the following 47 genes: APC, ATM, AXIN2, BARD1, BMPR1A, BRCA1, BRCA2, BRIP1, CDH1, CDKN2A (p14ARF), CDKN2A (p16INK4a), CKD4, CHEK2, CTNNA1, DICER1, EPCAM (Deletion/duplication testing only), GREM1 (promoter region deletion/duplication testing only), KIT, MEN1, MLH1, MSH2, MSH3, MSH6, MUTYH, NBN, NF1, NHTL1, PALB2, PDGFRA, PMS2, POLD1, POLE, PTEN, RAD50, RAD51C, RAD51D, SDHB, SDHC, SDHD, SMAD4, SMARCA4. STK11, TP53, TSC1, TSC2, and VHL.  The following genes were evaluated for sequence changes only: SDHA and HOXB13 c.251G>A variant only.  Based on Bethany Bowman's personal history of polyps she meets medical criteria for genetic testing. Despite that she meets criteria, she may still have an out of pocket cost. We discussed that if her out of pocket cost for testing is over $100, the laboratory will call and confirm whether she wants to proceed with testing.  If the out of pocket cost of testing is less than $100 she will be billed by the genetic testing  laboratory.   We discussed that some people do not want to undergo genetic testing due to fear of genetic discrimination.  A federal law called the Genetic Information Non-Discrimination Act (GINA) of 2008 helps protect individuals against genetic discrimination based on their genetic test results.  It impacts both health insurance and employment.  For health insurance, it protects against increased premiums, being kicked off insurance or being forced to take a test in order to be insured.  For employment it protects against hiring, firing and promoting decisions based on genetic test results.  Health status due to a cancer diagnosis is not protected under GINA.  This law does not protect life insurance, disability insurance, or other types of insurance.   PLAN: After considering the risks, benefits, and limitations, Bethany Bowman provided informed consent to pursue genetic testing and the blood sample was sent to [facility] for analysis of the Common Hereditary Cancers+RNA panel. Results should be available within approximately 2-3 weeks' time, at which point they will be disclosed by telephone to Bethany Bowman, as will any additional recommendations warranted by these results. Bethany Bowman will receive a summary of her genetic counseling visit and a copy of her results once available. This information will also be available in Epic.   Bethany Bowman questions were answered to her satisfaction today. Our contact information was provided should additional questions or concerns arise. Thank you for the referral and allowing [place] to share in the care of your patient.   Faith Rogue, MS, [facility] Genetic Counselor [email or web address] Phone: [phone]  The patient was seen for a total of 30 minutes in face-to-face genetic counseling.  Patient was seen alone. [employer] intern Raj Janus was present and assisted with this case. Dr. Grayland Ormond was available for discussion regarding this case.    _______________________________________________________________________ For Office Staff:  Number of people involved in session: 1 Was an Intern/ student involved with case: yes

## 2021-05-06 ENCOUNTER — Ambulatory Visit
Admission: RE | Admit: 2021-05-06 | Discharge: 2021-05-06 | Disposition: A | Discharge status: Home / Self Care | Payer: Medicare HMO | Source: Ambulatory Visit | Attending: Family Medicine | Admitting: Family Medicine

## 2021-05-06 ENCOUNTER — Other Ambulatory Visit: Payer: Self-pay

## 2021-05-06 DIAGNOSIS — Z1231 Encounter for screening mammogram for malignant neoplasm of breast: Secondary | ICD-10-CM | POA: Insufficient documentation

## 2021-05-11 ENCOUNTER — Ambulatory Visit: Payer: Self-pay | Admitting: Licensed Clinical Social Worker

## 2021-05-11 ENCOUNTER — Encounter: Payer: Self-pay | Admitting: Licensed Clinical Social Worker

## 2021-05-11 ENCOUNTER — Telehealth: Payer: Self-pay | Admitting: Licensed Clinical Social Worker

## 2021-05-11 DIAGNOSIS — Z86010 Personal history of colonic polyps: Secondary | ICD-10-CM

## 2021-05-11 DIAGNOSIS — Z800 Family history of malignant neoplasm of digestive organs: Secondary | ICD-10-CM

## 2021-05-11 DIAGNOSIS — Z8042 Family history of malignant neoplasm of prostate: Secondary | ICD-10-CM

## 2021-05-11 DIAGNOSIS — Z801 Family history of malignant neoplasm of trachea, bronchus and lung: Secondary | ICD-10-CM

## 2021-05-11 DIAGNOSIS — Z1379 Encounter for other screening for genetic and chromosomal anomalies: Secondary | ICD-10-CM | POA: Insufficient documentation

## 2021-05-11 NOTE — Telephone Encounter (Signed)
Revealed negative genetic testing.  Revealed that a VUS in RAD51C was identified.  We discussed that we do not know why she has had polyps or why there is cancer in the family. It could be due to a different gene that we are not testing, or something our current technology cannot pick up.  It will be important for her to keep in contact with genetics to learn if additional testing may be needed in the future.

## 2021-05-11 NOTE — Progress Notes (Addendum)
HPI:  Bethany Bowman was previously seen in the [place] clinic due to a personal history of colon polyps and family history of cancer and concerns regarding a hereditary predisposition to cancer. Please refer to our prior cancer genetics clinic note for more information regarding our discussion, assessment and recommendations, at the time. Bethany Bowman recent genetic test results were disclosed to her, as were recommendations warranted by these results. These results and recommendations are discussed in more detail below.  CANCER HISTORY:  Oncology History   No history exists.    FAMILY HISTORY:  We obtained a detailed, 4-generation family history.  Significant diagnoses are listed below: Family History  Problem Relation Age of Onset   Colon cancer Mother 5   Hypertension Mother    Alcohol abuse Father    Hypertension Father    Hypertension Sister    Hypertension Brother    Prostate cancer Brother        dx 44s metastatic   Lung cancer Brother 78   Hypertension Brother    Hypertension Brother    Breast cancer Neg Hx    Bethany Bowman has a son, 57, and daughter, 50, neither have had colonoscopy. She had 2 full brothers, 1 full sister, and 1 maternal half brother. Her half brother died at 73 of prostate cancer, which was metastatic, and he was diagnosed with this at 23. One of her full brothers died of lung cancer at 26. She is unaware of polyps in her siblings.    Bethany Bowman mother had colon cancer at 58 and died at 72. Patient had 12 maternal aunts/uncles, no cancers but she has limited information. No information about maternal grandparents.   Bethany Bowman's father died at 66, no cancer. Patient had 3 paternal uncles, 2 aunts, no cancers. No known cancers in cousins or grandparents.   Bethany Bowman is unaware of previous family history of genetic testing for hereditary cancer risks. Patient's maternal ancestors are of Black/American [place] descent, and paternal ancestors are of  [place] descent. There is no reported Ashkenazi Jewish ancestry. There is no known consanguinity.        GENETIC TEST RESULTS: Genetic testing reported out on 05/11/2021 through the [facility] Common Hereditary Cancers+RNA cancer panel found no pathogenic mutations.   The Common Hereditary Cancers Panel + RNA offered by [facility] includes sequencing and/or deletion duplication testing of the following 47 genes: APC, ATM, AXIN2, BARD1, BMPR1A, BRCA1, BRCA2, BRIP1, CDH1, CDKN2A (p14ARF), CDKN2A (p16INK4a), CKD4, CHEK2, CTNNA1, DICER1, EPCAM (Deletion/duplication testing only), GREM1 (promoter region deletion/duplication testing only), KIT, MEN1, MLH1, MSH2, MSH3, MSH6, MUTYH, NBN, NF1, NHTL1, PALB2, PDGFRA, PMS2, POLD1, POLE, PTEN, RAD50, RAD51C, RAD51D, SDHB, SDHC, SDHD, SMAD4, SMARCA4. STK11, TP53, TSC1, TSC2, and VHL.  The following genes were evaluated for sequence changes only: SDHA and HOXB13 c.251G>A variant only.   The test report has been scanned into EPIC and is located under the Molecular Pathology section of the Results Review tab.  A portion of the result report is included below for reference.     We discussed that because current genetic testing is not perfect, it is possible there may be a gene mutation in one of these genes that current testing cannot detect, but that chance is small.  There could be another gene that has not yet been discovered, or that we have not yet tested, that is responsible for the cancer diagnoses in the family. It is also possible there is a hereditary cause for the cancer  in the family that Bethany Bowman did not inherit and therefore was not identified in her testing.  Therefore, it is important to remain in touch with cancer genetics in the future so that we can continue to offer Bethany Bowman the most up to date genetic testing.   Genetic testing did identify a variant of uncertain significance (VUS) in the RAD51C gene called c.899C>T.  At this time, it is  unknown if this variant is associated with increased cancer risk or if this is a normal finding, but most variants such as this get reclassified to being inconsequential. It should not be used to make medical management decisions. With time, we suspect the lab will determine the significance of this variant, if any. If we do learn more about it we will try to contact Bethany Bowman to discuss it further. However, it is important to stay in touch with [place] periodically and keep the address and phone number up to date.  ADDITIONAL GENETIC TESTING: We discussed with Bethany Bowman that her genetic testing was fairly extensive.  If there are genes identified to increase cancer risk that can be analyzed in the future, we would be happy to discuss and coordinate this testing at that time.    CANCER SCREENING RECOMMENDATIONS: Bethany Bowman test result is considered negative (normal).  This means that we have not identified a hereditary cause for her family history of cancer at this time.   While reassuring, this does not definitively rule out a hereditary predisposition to cancer. It is still possible that there could be genetic mutations that are undetectable by current technology. There could be genetic mutations in genes that have not been tested or identified to increase cancer risk.  Therefore, it is recommended she continue to follow the cancer management and screening guidelines provided by her primary healthcare provider.   An individual's cancer risk and medical management are not determined by genetic test results alone. Overall cancer risk assessment incorporates additional factors, including personal medical history, family history, and any available genetic information that may result in a personalized plan for cancer prevention and surveillance.  This negative genetic test simply tells [place] that we cannot yet define why Bethany Bowman has had  an increased number of colorectal polyps.. Bethany Bowman's medical management and  screening should be based on the prospect that she will likely form more colon polyps and should, therefore, undergo more frequent colonoscopy screening at intervals determined by her GI providers.  An upper endoscopy could be considered as well.   RECOMMENDATIONS FOR FAMILY MEMBERS:  Relatives in this family might be at some increased risk of developing cancer, over the general population risk, simply due to the family history of cancer.  We recommended female relatives in this family have a yearly mammogram beginning at age 102, or 67 years younger than the earliest onset of cancer, an annual clinical breast exam, and perform monthly breast self-exams. Female relatives in this family should also have a gynecological exam as recommended by their primary provider.  All family members should be referred for colonoscopy starting at age 45.    It is also possible there is a hereditary cause for the cancer in Bethany Bowman's family that she did not inherit and therefore was not identified in her.  Based on Ms. Purdom's family history, we recommended those related to her brother who died of metastatic prostate cancer have genetic counseling and testing. Ms. Markel will let us know if we can be of any assistance  in coordinating genetic counseling and/or testing for these family members.  FOLLOW-UP: Lastly, we discussed with Ms. Shams that cancer genetics is a rapidly advancing field and it is possible that new genetic tests will be appropriate for her and/or her family members in the future. We encouraged her to remain in contact with cancer genetics on an annual basis so we can update her personal and family histories and let her know of advances in cancer genetics that may benefit this family.   Our contact number was provided. Ms. Sasaki questions were answered to her satisfaction, and she knows she is welcome to call us at anytime with additional questions or concerns.   Faith Rogue, MS, [facility] Genetic  Counselor [email or web address] Phone: [phone]

## 2021-07-13 ENCOUNTER — Other Ambulatory Visit: Payer: Self-pay | Admitting: Family Medicine

## 2021-07-13 DIAGNOSIS — I10 Essential (primary) hypertension: Secondary | ICD-10-CM

## 2021-08-01 ENCOUNTER — Other Ambulatory Visit: Payer: Self-pay

## 2021-08-01 ENCOUNTER — Encounter: Payer: Self-pay | Admitting: Physician Assistant

## 2021-08-01 ENCOUNTER — Ambulatory Visit (INDEPENDENT_AMBULATORY_CARE_PROVIDER_SITE_OTHER): Payer: Medicare HMO | Admitting: Physician Assistant

## 2021-08-01 VITALS — BP 168/90 | HR 72 | Temp 98.5°F | Resp 16 | Ht 69.0 in | Wt 258.8 lb

## 2021-08-01 DIAGNOSIS — I10 Essential (primary) hypertension: Secondary | ICD-10-CM | POA: Diagnosis not present

## 2021-08-01 DIAGNOSIS — E785 Hyperlipidemia, unspecified: Secondary | ICD-10-CM | POA: Diagnosis not present

## 2021-08-01 DIAGNOSIS — E669 Obesity, unspecified: Secondary | ICD-10-CM | POA: Diagnosis not present

## 2021-08-01 LAB — COMPREHENSIVE METABOLIC PANEL
AG Ratio: 1.3 (calc) (ref 1.0–2.5)
ALT: 20 U/L (ref 6–29)
AST: 20 U/L (ref 10–35)
Albumin: 3.9 g/dL (ref 3.6–5.1)
Alkaline phosphatase (APISO): 51 U/L (ref 37–153)
BUN: 17 mg/dL (ref 7–25)
CO2: 32 mmol/L (ref 20–32)
Calcium: 10.3 mg/dL (ref 8.6–10.4)
Chloride: 102 mmol/L (ref 98–110)
Creat: 0.99 mg/dL (ref 0.60–1.00)
Globulin: 3.1 g/dL (calc) (ref 1.9–3.7)
Glucose, Bld: 106 mg/dL — ABNORMAL HIGH (ref 65–99)
Potassium: 3.7 mmol/L (ref 3.5–5.3)
Sodium: 143 mmol/L (ref 135–146)
Total Bilirubin: 0.4 mg/dL (ref 0.2–1.2)
Total Protein: 7 g/dL (ref 6.1–8.1)

## 2021-08-01 LAB — CBC WITH DIFFERENTIAL/PLATELET
Absolute Monocytes: 409 cells/uL (ref 200–950)
Basophils Absolute: 50 cells/uL (ref 0–200)
Basophils Relative: 0.8 %
Eosinophils Absolute: 149 cells/uL (ref 15–500)
Eosinophils Relative: 2.4 %
HCT: 39.8 % (ref 35.0–45.0)
Hemoglobin: 13.3 g/dL (ref 11.7–15.5)
Lymphs Abs: 2982 cells/uL (ref 850–3900)
MCH: 27.7 pg (ref 27.0–33.0)
MCHC: 33.4 g/dL (ref 32.0–36.0)
MCV: 82.9 fL (ref 80.0–100.0)
MPV: 9.9 fL (ref 7.5–12.5)
Monocytes Relative: 6.6 %
Neutro Abs: 2610 cells/uL (ref 1500–7800)
Neutrophils Relative %: 42.1 %
Platelets: 223 10*3/uL (ref 140–400)
RBC: 4.8 10*6/uL (ref 3.80–5.10)
RDW: 13.9 % (ref 11.0–15.0)
Total Lymphocyte: 48.1 %
WBC: 6.2 10*3/uL (ref 3.8–10.8)

## 2021-08-01 LAB — LIPID PANEL
Cholesterol: 150 mg/dL (ref <200)
HDL: 47 mg/dL — ABNORMAL LOW (ref >=50)
LDL Cholesterol (Calc): 83 mg/dL (calc)
Non-HDL Cholesterol (Calc): 103 mg/dL (calc) (ref <130)
Total CHOL/HDL Ratio: 3.2 (calc) (ref <5.0)
Triglycerides: 100 mg/dL (ref <150)

## 2021-08-01 NOTE — Patient Instructions (Addendum)
Please get a blood pressure cuff so you can take your blood pressure at home and record those measures I would like you to come back in about 6 weeks so we can see what your blood pressures look like at home. Try to take them at different parts of the day after you have taken your medications We will update you with the results of your lab work when they are available Continue your current medications as directed. It was nice to meet you and I appreciate the opportunity to be involved in your care

## 2021-08-01 NOTE — Progress Notes (Signed)
Established Patient Office Visit  Subjective:  Patient ID: Bethany Bowman, female    DOB: 02-24-1951  Age: 71 y.o. MRN: [id number]  Today's Provider: Talitha Givens, MHS,PA-C Introduced myself to the patient as a PA-C and provided education on APPs in clinical practice.    CC:  Chief Complaint  Patient presents with   Follow-up   Hypertension   Hyperlipidemia    HPI Bethany Bowman presents for follow up for chronic hypertension and hyperlipidemia She states she is taking her medications as directed  She is not taking her BP measures at home so she is not sure what they are like at home She states she took her BP meds about 15 minutes prior to her apt  Exercise: States she usually does in the spring and summer. She states she usually walks outside. Usually twice per day, 30 minutes each time  Diet: States she likes fried foods and salt.  Discussed importance of heart healthy diet.     Past Medical History:  Diagnosis Date   Allergy    Anxiety 11/25/2014   Family history of colon cancer    Family history of lung cancer    Family history of prostate cancer    GERD (gastroesophageal reflux disease)    Hyperlipidemia    Hypertension    Personal history of colonic polyps     Past Surgical History:  Procedure Laterality Date   COLONOSCOPY WITH PROPOFOL N/A 12/28/2016   Procedure: COLONOSCOPY WITH PROPOFOL;  Surgeon: Jonathon Bellows, MD;  Location: [facility] ENDOSCOPY;  Service: Endoscopy;  Laterality: N/A;   COLONOSCOPY WITH PROPOFOL N/A 01/15/2018   Procedure: COLONOSCOPY WITH PROPOFOL;  Surgeon: Jonathon Bellows, MD;  Location: [facility] ENDOSCOPY;  Service: Gastroenterology;  Laterality: N/A;   COLONOSCOPY WITH PROPOFOL N/A 03/28/2021   Procedure: COLONOSCOPY WITH PROPOFOL;  Surgeon: Jonathon Bellows, MD;  Location: [facility] ENDOSCOPY;  Service: Gastroenterology;  Laterality: N/A;    Family History  Problem Relation Age of Onset   Colon cancer Mother 59   Hypertension Mother    Alcohol abuse  Father    Hypertension Father    Hypertension Sister    Hypertension Brother    Prostate cancer Brother        dx 109s metastatic   Lung cancer Brother 69   Hypertension Brother    Hypertension Brother    Breast cancer Neg Hx     Social History   Socioeconomic History   Marital status: Married    Spouse name: Lavera Guise   Number of children: 2   Years of education: some college   Highest education level: 12th grade  Occupational History   Not on file  Tobacco Use   Smoking status: Former    Packs/day: 0.50    Years: 12.00    Pack years: 6.00    Types: Cigarettes    Quit date: 08/03/1981    Years since quitting: 40.0   Smokeless tobacco: Never   Tobacco comments:    smoking cessation materials not required  Vaping Use   Vaping Use: Never used  Substance and Sexual Activity   Alcohol use: No    Alcohol/week: 0.0 standard drinks   Drug use: No   Sexual activity: Not Currently  Other Topics Concern   Not on file  Social History Narrative   Not on file   Social Determinants of Health   Financial Resource Strain: Low Risk    Difficulty of Paying Living Expenses: Not hard at all  Food Insecurity: No  Food Insecurity   Worried About Charity fundraiser in the Last Year: Never true   Ran Out of Food in the Last Year: Never true  Transportation Needs: No Transportation Needs   Lack of Transportation (Medical): No   Lack of Transportation (Non-Medical): No  Physical Activity: Inactive   Days of Exercise per Week: 0 days   Minutes of Exercise per Session: 0 min  Stress: No Stress Concern Present   Feeling of Stress : Not at all  Social Connections: Moderately Integrated   Frequency of Communication with Friends and Family: More than three times a week   Frequency of Social Gatherings with Friends and Family: More than three times a week   Attends Religious Services: More than 4 times per year   Active Member of [employer] or Organizations: No   Attends Archivist  Meetings: Never   Marital Status: Married  Human resources officer Violence: Not At Risk   Fear of Current or Ex-Partner: No   Emotionally Abused: No   Physically Abused: No   Sexually Abused: No    Outpatient Medications Prior to Visit  Medication Sig Dispense Refill   aspirin EC 81 MG tablet Take 1 tablet (81 mg total) by mouth daily.     atenolol (TENORMIN) 50 MG tablet TAKE 1 TABLET EVERY DAY 90 tablet 1   atorvastatin (LIPITOR) 40 MG tablet TAKE 1 TABLET EVERY DAY 90 tablet 0   cholecalciferol (VITAMIN D) 1000 units tablet Take 1,000 Units by mouth daily.     fluticasone (FLONASE) 50 MCG/ACT nasal spray USE 2 SPRAYS IN EACH NOSTRIL EVERY DAY 48 g 2   lisinopril-hydrochlorothiazide (ZESTORETIC) 20-25 MG tablet TAKE 1 TABLET EVERY DAY 90 tablet 3   No facility-administered medications prior to visit.    No Known Allergies  ROS Review of Systems  Eyes:  Negative for photophobia and visual disturbance.  Respiratory:  Negative for shortness of breath and wheezing.   Cardiovascular:  Negative for chest pain, palpitations and leg swelling.  Gastrointestinal:  Negative for diarrhea, nausea and vomiting.  Neurological:  Negative for dizziness, tremors, light-headedness and headaches.     Objective:    Physical Exam Vitals reviewed.  Constitutional:      General: She is awake.     Appearance: Normal appearance. She is well-developed and well-groomed. She is obese.  HENT:     Head: Normocephalic and atraumatic.  Eyes:     General: Lids are normal.     Extraocular Movements: Extraocular movements intact.     Conjunctiva/sclera: Conjunctivae normal.     Pupils: Pupils are equal, round, and reactive to light.  Neck:     Vascular: No carotid bruit.     Trachea: Phonation normal.  Cardiovascular:     Rate and Rhythm: Normal rate and regular rhythm.     Pulses: Normal pulses.     Heart sounds: Normal heart sounds.  Pulmonary:     Effort: Pulmonary effort is normal.     Breath  sounds: Normal breath sounds and air entry. No decreased breath sounds, wheezing, rhonchi or rales.  Musculoskeletal:     Right lower leg: No edema.     Left lower leg: No edema.  Neurological:     General: No focal deficit present.     Mental Status: She is alert.     GCS: GCS eye subscore is 4. GCS verbal subscore is 5. GCS motor subscore is 6.  Psychiatric:  Mood and Affect: Mood normal.        Behavior: Behavior normal. Behavior is cooperative.        Thought Content: Thought content normal.        Judgment: Judgment normal.    BP (!) 168/90    Pulse 72    Temp 98.5 F (36.9 C) (Oral)    Resp 16    Ht 5' 9"  (1.753 m)    Wt 258 lb 12.8 oz (117.4 kg)    LMP  (LMP Unknown)    SpO2 97%    BMI 38.22 kg/m  Wt Readings from Last 3 Encounters:  08/01/21 258 lb 12.8 oz (117.4 kg)  03/28/21 230 lb (104.3 kg)  02/15/21 255 lb 12.8 oz (116 kg)     Health Maintenance Due  Topic Date Due   Zoster Vaccines- Shingrix (1 of 2) Never done   Pneumonia Vaccine 68+ Years old (3) 04/02/2018    There are no preventive care reminders to display for this patient.  Lab Results  Component Value Date   TSH 1.310 11/25/2014   Lab Results  Component Value Date   WBC 5.1 07/02/2019   HGB 13.5 07/02/2019   HCT 40.9 07/02/2019   MCV 82.1 07/02/2019   PLT 257 07/02/2019   Lab Results  Component Value Date   NA 143 01/28/2021   K 3.8 01/28/2021   CO2 37 (H) 01/28/2021   GLUCOSE 89 01/28/2021   BUN 12 01/28/2021   CREATININE 0.99 01/28/2021   BILITOT 0.7 01/28/2021   ALKPHOS 46 08/24/2016   AST 22 01/28/2021   ALT 27 01/28/2021   PROT 7.1 01/28/2021   ALBUMIN 4.0 08/24/2016   CALCIUM 10.4 01/28/2021   EGFR 61 01/28/2021   Lab Results  Component Value Date   CHOL 146 07/13/2020   Lab Results  Component Value Date   HDL 50 07/13/2020   Lab Results  Component Value Date   LDLCALC 77 07/13/2020   Lab Results  Component Value Date   TRIG 102 07/13/2020   Lab Results   Component Value Date   CHOLHDL 2.9 07/13/2020   No results found for: HGBA1C    Assessment & Plan:   Problem List Items Addressed This Visit       Cardiovascular and Mediastinum   Benign essential HTN    Chronic, historic condition, unsure if well managed given her BP in office today  Recommend to patient that she take BP measures at home at different times of day to determine if her BP meds are keeping her in appropriate range  Will make adjustments as needed at follow up in 4-6 weeks depending on BP records  Continue current medications at this time Recommend she try to reinitiate walking and exercise efforts as well as heart-healthy diet         Relevant Orders   Comprehensive Metabolic Panel (CMET)   CBC w/Diff/Platelet     Other   Hyperlipidemia LDL goal <100 - Primary    Chronic, historic condition, stable at this time Discussed importance of diet and exercise in maintaining healthy cholesterol levels Lipid panel ordered for routine monitoring Continue current medications Results to dictate further management       Relevant Orders   Lipid Profile   Obesity (BMI 35.0-39.9 without comorbidity)   Relevant Orders   Lipid Profile   Comprehensive Metabolic Panel (CMET)   CBC w/Diff/Platelet     Return in about 6 weeks (around 09/12/2021) for BP check.  I, Royce Sciara E Sibel Khurana, PA-C, have reviewed all documentation for this visit. The documentation on 08/01/21 for the exam, diagnosis, procedures, and orders are all accurate and complete.   Zackariah Vanderpol, [place] MPH [facility]

## 2021-08-01 NOTE — Assessment & Plan Note (Signed)
Chronic, historic condition, stable at this time Discussed importance of diet and exercise in maintaining healthy cholesterol levels Lipid panel ordered for routine monitoring Continue current medications Results to dictate further management

## 2021-08-01 NOTE — Assessment & Plan Note (Addendum)
Chronic, historic condition, unsure if well managed given her BP in office today  Recommend to patient that she take BP measures at home at different times of day to determine if her BP meds are keeping her in appropriate range  Will make adjustments as needed at follow up in 4-6 weeks depending on BP records  Continue current medications at this time Recommend she try to reinitiate walking and exercise efforts as well as heart-healthy diet

## 2021-08-16 ENCOUNTER — Other Ambulatory Visit: Payer: Self-pay | Admitting: Family Medicine

## 2021-08-16 DIAGNOSIS — I10 Essential (primary) hypertension: Secondary | ICD-10-CM

## 2021-09-07 ENCOUNTER — Other Ambulatory Visit: Payer: Self-pay | Admitting: Family Medicine

## 2021-09-07 DIAGNOSIS — E785 Hyperlipidemia, unspecified: Secondary | ICD-10-CM

## 2021-09-12 ENCOUNTER — Ambulatory Visit: Payer: Medicare HMO | Admitting: Internal Medicine

## 2021-10-12 ENCOUNTER — Other Ambulatory Visit: Payer: Self-pay | Admitting: Family Medicine

## 2021-10-12 DIAGNOSIS — J302 Other seasonal allergic rhinitis: Secondary | ICD-10-CM

## 2021-10-12 DIAGNOSIS — J0140 Acute pansinusitis, unspecified: Secondary | ICD-10-CM

## 2022-01-16 ENCOUNTER — Other Ambulatory Visit: Payer: Self-pay | Admitting: Physician Assistant

## 2022-01-16 DIAGNOSIS — I10 Essential (primary) hypertension: Secondary | ICD-10-CM

## 2022-01-17 NOTE — Telephone Encounter (Signed)
Requested Prescriptions  Pending Prescriptions Disp Refills  . atenolol (TENORMIN) 50 MG tablet [Pharmacy Med Name: ATENOLOL 50 MG Tablet] 90 tablet 0    Sig: TAKE 1 TABLET EVERY DAY     Cardiovascular: Beta Blockers 2 Failed - 01/16/2022 11:36 AM      Failed - Last BP in normal range    BP Readings from Last 1 Encounters:  08/01/21 (!) 168/90         Passed - Cr in normal range and within 360 days    Creat  Date Value Ref Range Status  08/01/2021 0.99 0.60 - 1.00 mg/dL Final   Creatinine, Urine  Date Value Ref Range Status  02/18/2016 167 20 - 320 mg/dL Final         Passed - Last Heart Rate in normal range    Pulse Readings from Last 1 Encounters:  08/01/21 72         Passed - Valid encounter within last 6 months    Recent Outpatient Visits          5 months ago Hyperlipidemia LDL goal <100   [place], PA-C   11 months ago Benign essential HTN   [facility] Delsa Grana, PA-C   1 year ago Benign essential HTN   [place], PA-C   2 years ago Benign essential HTN   [place], PA-C   2 years ago Benign essential HTN   [facility] Delsa Grana, PA-C      Future Appointments            In 1 month [facility], [facility]

## 2022-02-13 ENCOUNTER — Other Ambulatory Visit: Payer: Self-pay | Admitting: Family Medicine

## 2022-02-13 DIAGNOSIS — I10 Essential (primary) hypertension: Secondary | ICD-10-CM

## 2022-02-13 NOTE — Telephone Encounter (Signed)
Pt needs to f/u for further refills

## 2022-02-16 ENCOUNTER — Ambulatory Visit (INDEPENDENT_AMBULATORY_CARE_PROVIDER_SITE_OTHER): Payer: Medicare HMO

## 2022-02-16 DIAGNOSIS — Z0000 Encounter for general adult medical examination without abnormal findings: Secondary | ICD-10-CM

## 2022-02-16 NOTE — Progress Notes (Signed)
Subjective:  I connected with  Bethany Bowman on 02/16/22 by a audio enabled telemedicine application and verified that I am speaking with the correct person using two identifiers.  Patient Location: Home  Provider Location: Office/Clinic  I discussed the limitations of evaluation and management by telemedicine. The patient expressed understanding and agreed to proceed.  Bethany Bowman is a 71 y.o. female who presents for Medicare Annual (Subsequent) preventive examination.  Review of Systems    Defer to PCP       Objective:    There were no vitals filed for this visit. There is no height or weight on file to calculate BMI.     03/28/2021    7:21 AM 02/15/2021   10:29 AM 12/17/2018    3:01 PM 01/15/2018    7:07 AM 12/13/2017    2:15 PM 03/15/2017    2:28 PM 12/28/2016    8:33 AM  Advanced Directives  Does Patient Have a Medical Advance Directive? No No No No No No No  Would patient like information on creating a medical advance directive? No - Patient declined Yes (MAU/Ambulatory/Procedural Areas - Information given) No - Patient declined No - Patient declined Yes (MAU/Ambulatory/Procedural Areas - Information given)  No - Patient declined    Current Medications (verified) Outpatient Encounter Medications as of 02/16/2022  Medication Sig   atorvastatin (LIPITOR) 40 MG tablet TAKE 1 TABLET EVERY DAY   aspirin EC 81 MG tablet Take 1 tablet (81 mg total) by mouth daily.   atenolol (TENORMIN) 50 MG tablet TAKE 1 TABLET EVERY DAY   cholecalciferol (VITAMIN D) 1000 units tablet Take 1,000 Units by mouth daily.   fluticasone (FLONASE) 50 MCG/ACT nasal spray USE 2 SPRAYS IN EACH NOSTRIL EVERY DAY   lisinopril-hydrochlorothiazide (ZESTORETIC) 20-25 MG tablet TAKE 1 TABLET EVERY DAY   No facility-administered encounter medications on file as of 02/16/2022.    Allergies (verified) Patient has no known allergies.   History: Past Medical History:  Diagnosis Date   Allergy    Anxiety  11/25/2014   Family history of colon cancer    Family history of lung cancer    Family history of prostate cancer    GERD (gastroesophageal reflux disease)    Hyperlipidemia    Hypertension    Personal history of colonic polyps    Past Surgical History:  Procedure Laterality Date   COLONOSCOPY WITH PROPOFOL N/A 12/28/2016   Procedure: COLONOSCOPY WITH PROPOFOL;  Surgeon: Jonathon Bellows, MD;  Location: [facility] ENDOSCOPY;  Service: Endoscopy;  Laterality: N/A;   COLONOSCOPY WITH PROPOFOL N/A 01/15/2018   Procedure: COLONOSCOPY WITH PROPOFOL;  Surgeon: Jonathon Bellows, MD;  Location: [facility] ENDOSCOPY;  Service: Gastroenterology;  Laterality: N/A;   COLONOSCOPY WITH PROPOFOL N/A 03/28/2021   Procedure: COLONOSCOPY WITH PROPOFOL;  Surgeon: Jonathon Bellows, MD;  Location: [facility] ENDOSCOPY;  Service: Gastroenterology;  Laterality: N/A;   Family History  Problem Relation Age of Onset   Colon cancer Mother 56   Hypertension Mother    Alcohol abuse Father    Hypertension Father    Hypertension Sister    Hypertension Brother    Prostate cancer Brother        dx 103s metastatic   Lung cancer Brother 33   Hypertension Brother    Hypertension Brother    Breast cancer Neg Hx    Social History   Socioeconomic History   Marital status: Married    Spouse name: Lavera Guise   Number of children: 2  Years of education: some college   Highest education level: 12th grade  Occupational History   Not on file  Tobacco Use   Smoking status: Former    Packs/day: 0.50    Years: 12.00    Total pack years: 6.00    Types: Cigarettes    Quit date: 08/03/1981    Years since quitting: 40.5   Smokeless tobacco: Never   Tobacco comments:    smoking cessation materials not required  Vaping Use   Vaping Use: Never used  Substance and Sexual Activity   Alcohol use: No    Alcohol/week: 0.0 standard drinks of alcohol   Drug use: No   Sexual activity: Not Currently  Other Topics Concern   Not on file  Social History  Narrative   Not on file   Social Determinants of Health   Financial Resource Strain: Low Risk  (02/15/2021)   Overall Financial Resource Strain (CARDIA)    Difficulty of Paying Living Expenses: Not hard at all  Food Insecurity: No Food Insecurity (02/15/2021)   Hunger Vital Sign    Worried About Running Out of Food in the Last Year: Never true    Ran Out of Food in the Last Year: Never true  Transportation Needs: No Transportation Needs (02/15/2021)   PRAPARE - Hydrologist (Medical): No    Lack of Transportation (Non-Medical): No  Physical Activity: Inactive (02/15/2021)   Exercise Vital Sign    Days of Exercise per Week: 0 days    Minutes of Exercise per Session: 0 min  Stress: No Stress Concern Present (02/15/2021)   [place]    Feeling of Stress : Not at all  Social Connections: Moderately Integrated (02/15/2021)   Social Connection and Isolation Panel [NHANES]    Frequency of Communication with Friends and Family: More than three times a week    Frequency of Social Gatherings with Friends and Family: More than three times a week    Attends Religious Services: More than 4 times per year    Active Member of [employer] or Organizations: No    Attends Archivist Meetings: Never    Marital Status: Married    Tobacco Counseling Counseling given: Not Answered Tobacco comments: smoking cessation materials not required   Clinical Intake:                 Diabetic?No         Activities of Daily Living    08/01/2021    7:59 AM  In your present state of health, do you have any difficulty performing the following activities:  Hearing? 0  Vision? 0  Difficulty concentrating or making decisions? 0  Walking or climbing stairs? 0  Dressing or bathing? 0  Doing errands, shopping? 0    Patient Care Team: Delsa Grana, PA-C as PCP - General (Family Medicine)  Indicate  any recent Medical Services you may have received from other than [facility] providers in the past year (date may be approximate).     Assessment:   This is a routine wellness examination for Bethany Bowman.  Hearing/Vision screen No results found.  Dietary issues and exercise activities discussed:     Goals Addressed   None   Depression Screen    08/01/2021    7:59 AM 02/15/2021   10:20 AM 01/28/2021    9:37 AM 07/13/2020   11:15 AM 01/08/2020   10:40 AM 07/02/2019    8:11  AM 03/19/2019    9:25 AM  PHQ 2/9 Scores  PHQ - 2 Score 0 0 0 0 0 0 0  PHQ- 9 Score 0   0 0 2 0    Fall Risk    08/01/2021    7:58 AM 02/15/2021   10:30 AM 01/28/2021    9:37 AM 07/13/2020   11:15 AM 01/08/2020   10:40 AM  Fall Risk   Falls in the past year? 0 0 0 0 0  Number falls in past yr: 0 0 0 0 0  Injury with Fall? 0 0 0 0 0  Risk for fall due to : No Fall Risks No Fall Risks     Follow up Falls prevention discussed Falls prevention discussed Falls evaluation completed  Falls evaluation completed    [place]:  Any stairs in or around the home? No  If so, are there any without handrails?  N/A Home free of loose throw rugs in walkways, pet beds, electrical cords, etc? Yes  Adequate lighting in your home to reduce risk of falls? Yes   ASSISTIVE DEVICES UTILIZED TO PREVENT FALLS:  Life alert? No  Use of a cane, walker or w/c? No  Grab bars in the bathroom? Yes  Shower chair or bench in shower? No  Elevated toilet seat or a handicapped toilet? Yes   TIMED UP AND GO:  Was the test performed? No .  Length of time to ambulate 10 feet: N/A sec.     Cognitive Function:        12/17/2018    3:05 PM 12/13/2017    2:17 PM  6CIT Screen  What Year? 0 points 0 points  What month? 0 points 0 points  What time? 0 points 0 points  Count back from 20 0 points 0 points  Months in reverse 0 points 0 points  Repeat phrase 0 points 0 points  Total Score 0 points 0 points     Immunizations Immunization History  Administered Date(s) Administered   Fluad Quad(high Dose 65+) 07/13/2020   Influenza Split 01/11/2010   Influenza, High Dose Seasonal PF 02/24/2017, 04/16/2019   Influenza, Seasonal, Injecte, Preservative Fre 12/19/2010, 01/28/2014   Influenza-Unspecified 02/15/2015   Pneumococcal Conjugate-13 04/03/2014   Pneumococcal Polysaccharide-23 04/02/2013   Tdap 08/26/2012   Zoster, Live 02/12/2012    TDAP status: Up to date  Flu Vaccine status: Due, Education has been provided regarding the importance of this vaccine. Advised may receive this vaccine at local pharmacy or Health Dept. Aware to provide a copy of the vaccination record if obtained from local pharmacy or Health Dept. Verbalized acceptance and understanding.  Pneumococcal vaccine status: Due, Education has been provided regarding the importance of this vaccine. Advised may receive this vaccine at local pharmacy or Health Dept. Aware to provide a copy of the vaccination record if obtained from local pharmacy or Health Dept. Verbalized acceptance and understanding.  Covid-19 vaccine status: Completed vaccines  Qualifies for Shingles Vaccine? Yes  Zostavax completed No   Shingrix Completed?: Yes  Screening Tests Health Maintenance  Topic Date Due   COVID-19 Vaccine (1) Never done   Zoster Vaccines- Shingrix (1 of 2) Never done   Pneumonia Vaccine 37+ Years old (3 - PPSV23 or PCV20) 04/02/2018   INFLUENZA VACCINE  01/17/2022   COLONOSCOPY (Pts 45-42yr Insurance coverage will need to be confirmed)  03/28/2022   MAMMOGRAM  05/06/2022   TETANUS/TDAP  08/27/2022   DEXA SCAN  Completed   Hepatitis C Screening  Completed   HPV VACCINES  Aged Out    Health Maintenance  Health Maintenance Due  Topic Date Due   COVID-19 Vaccine (1) Never done   Zoster Vaccines- Shingrix (1 of 2) Never done   Pneumonia Vaccine 60+ Years old (3 - PPSV23 or PCV20) 04/02/2018   INFLUENZA VACCINE   01/17/2022    Colorectal cancer screening: Type of screening: Colonoscopy. Completed 10/0/2022. Repeat every 1 years  Mammogram status: Completed 05/06/2021. Repeat every year  Bone Density status: Completed 02/18/2019. Results reflect: Bone density results: NORMAL. Repeat every 5 years.  Lung Cancer Screening: (Low Dose CT Chest recommended if Age 89-80 years, 30 pack-year currently smoking OR have quit w/in 15years.) does not qualify.   Lung Cancer Screening Referral: N/A  Additional Screening  Hepatitis C Screening: does not qualify; Completed 08/24/2016  Vision Screening: Recommended annual ophthalmology exams for early detection of glaucoma and other disorders of the eye. Is the patient up to date with their annual eye exam?  No  Who is the provider or what is the name of the office in which the patient attends annual eye exams? Will schedule with [facility] If pt is not established with a provider, would they like to be referred to a provider to establish care? No .   Dental Screening: Recommended annual dental exams for proper oral hygiene  Community Resource Referral / Chronic Care Management: CRR required this visit?  No   CCM required this visit?  No      Plan:     I have personally reviewed and noted the following in the patient's chart:   Medical and social history Use of alcohol, tobacco or illicit drugs  Current medications and supplements including opioid prescriptions. Patient is not currently taking opioid prescriptions. Functional ability and status Nutritional status Physical activity Advanced directives List of other physicians Hospitalizations, surgeries, and ER visits in previous 12 months Vitals Screenings to include cognitive, depression, and falls Referrals and appointments  In addition, I have reviewed and discussed with patient certain preventive protocols, quality metrics, and best practice recommendations. A written personalized care plan for  preventive services as well as general preventive health recommendations were provided to patient.     Royal Hawthorn, CMA   02/16/2022   Nurse Notes: Non Face to face 32 minutes spent.  Bethany Bowman , Thank you for taking time to come for your Medicare Wellness Visit. I appreciate your ongoing commitment to your health goals. Please review the following plan we discussed and let me know if I can assist you in the future.   These are the goals we discussed:  Goals      DIET - INCREASE WATER INTAKE     Recommend to drink at least 6-8 8oz glasses of water per day.     Increase physical activity     Recommend increasing physical activity to at least 3 days per week and utilize [employer] through [employer]        This is a list of the screening recommended for you and due dates:  Health Maintenance  Topic Date Due   COVID-19 Vaccine (1) Never done   Zoster (Shingles) Vaccine (1 of 2) Never done   Pneumonia Vaccine (3 - PPSV23 or PCV20) 04/02/2018   Flu Shot  01/17/2022   Colon Cancer Screening  03/28/2022   Mammogram  05/06/2022   Tetanus Vaccine  08/27/2022   DEXA scan (bone density measurement)  Completed   Hepatitis C Screening: USPSTF Recommendation to screen - Ages 25-79 yo.  Completed   HPV Vaccine  Aged Out

## 2022-02-23 ENCOUNTER — Encounter: Payer: Self-pay | Admitting: Family Medicine

## 2022-02-23 ENCOUNTER — Ambulatory Visit (INDEPENDENT_AMBULATORY_CARE_PROVIDER_SITE_OTHER): Payer: Medicare HMO | Admitting: Family Medicine

## 2022-02-23 VITALS — BP 178/92 | HR 64 | Temp 98.3°F | Resp 16 | Ht 69.0 in | Wt 256.6 lb

## 2022-02-23 DIAGNOSIS — E785 Hyperlipidemia, unspecified: Secondary | ICD-10-CM | POA: Diagnosis not present

## 2022-02-23 DIAGNOSIS — Z23 Encounter for immunization: Secondary | ICD-10-CM

## 2022-02-23 DIAGNOSIS — I10 Essential (primary) hypertension: Secondary | ICD-10-CM

## 2022-02-23 MED ORDER — SHINGRIX 50 MCG/0.5ML IM SUSR: 0.5000 mL | Freq: Once | INTRAMUSCULAR | 0 refills | Status: AC

## 2022-02-23 MED ORDER — VALSARTAN 160 MG PO TABS: 160.0000 mg | ORAL_TABLET | Freq: Every day | ORAL | 0 refills | Status: DC

## 2022-02-23 MED ORDER — HYDROCHLOROTHIAZIDE 25 MG PO TABS: 25.0000 mg | ORAL_TABLET | Freq: Every day | ORAL | 0 refills | Status: DC

## 2022-02-23 NOTE — Progress Notes (Signed)
Name: Bethany Bowman   MRN: [id number]    DOB: May 08, 1951   Date:02/23/2022       Progress Note  Chief Complaint  Patient presents with   Follow-up   Hypertension    Pt states at home BP stays running high as well unsure if medication is helping.   Hyperlipidemia     Subjective:   Bethany Bowman is a 71 y.o. female, presents to clinic for routine f/up but BP is very high    She is on atenolol and lisinopril-HCTZ 20-25 BP high since earlier this year, prior to that no problem with having BP at goal She denies HA, SOB, CP exertional sx, LE edema, palpitation, visual disturbances, lightheadedness, hypotension, syncope, orthopnea,   Dietary efforts for BP?  No- she intends to work on avoiding salt and walking more   BP Readings from Last 10 Encounters:  02/23/22 (!) 178/92  08/01/21 (!) 168/90  03/28/21 116/68  02/15/21 (!) 142/88  01/28/21 128/84  07/13/20 124/78  01/08/20 128/84  07/16/19 (!) 142/90  07/02/19 (!) 150/94  03/15/18 130/76        Current Outpatient Medications:    aspirin EC 81 MG tablet, Take 1 tablet (81 mg total) by mouth daily., Disp: , Rfl:    atenolol (TENORMIN) 50 MG tablet, TAKE 1 TABLET EVERY DAY, Disp: 90 tablet, Rfl: 0   atorvastatin (LIPITOR) 40 MG tablet, TAKE 1 TABLET EVERY DAY, Disp: 90 tablet, Rfl: 3   cholecalciferol (VITAMIN D) 1000 units tablet, Take 1,000 Units by mouth daily., Disp: , Rfl:    fluticasone (FLONASE) 50 MCG/ACT nasal spray, USE 2 SPRAYS IN EACH NOSTRIL EVERY DAY, Disp: 48 g, Rfl: 2   lisinopril-hydrochlorothiazide (ZESTORETIC) 20-25 MG tablet, TAKE 1 TABLET EVERY DAY, Disp: 30 tablet, Rfl: 0  Patient Active Problem List   Diagnosis Date Noted   Genetic testing 05/11/2021   Family history of colon cancer 04/20/2021   Family history of prostate cancer 04/20/2021   Family history of lung cancer 04/20/2021   Personal history of colonic polyps 04/20/2021   Allergic rhinitis 01/28/2021   Obesity (BMI 35.0-39.9 without  comorbidity) 03/15/2017   Hyperlipidemia LDL goal <100 02/18/2016   Insomnia 11/25/2014   Benign essential HTN 07/01/2007    Past Surgical History:  Procedure Laterality Date   COLONOSCOPY WITH PROPOFOL N/A 12/28/2016   Procedure: COLONOSCOPY WITH PROPOFOL;  Surgeon: Jonathon Bellows, MD;  Location: [facility] ENDOSCOPY;  Service: Endoscopy;  Laterality: N/A;   COLONOSCOPY WITH PROPOFOL N/A 01/15/2018   Procedure: COLONOSCOPY WITH PROPOFOL;  Surgeon: Jonathon Bellows, MD;  Location: [facility] ENDOSCOPY;  Service: Gastroenterology;  Laterality: N/A;   COLONOSCOPY WITH PROPOFOL N/A 03/28/2021   Procedure: COLONOSCOPY WITH PROPOFOL;  Surgeon: Jonathon Bellows, MD;  Location: [facility] ENDOSCOPY;  Service: Gastroenterology;  Laterality: N/A;    Family History  Problem Relation Age of Onset   Colon cancer Mother 46   Hypertension Mother    Alcohol abuse Father    Hypertension Father    Hypertension Sister    Hypertension Brother    Prostate cancer Brother        dx 71s metastatic   Lung cancer Brother 33   Hypertension Brother    Hypertension Brother    Breast cancer Neg Hx     Social History   Tobacco Use   Smoking status: Former    Packs/day: 0.50    Years: 12.00    Total pack years: 6.00    Types: Cigarettes  Quit date: 08/03/1981    Years since quitting: 40.5   Smokeless tobacco: Never   Tobacco comments:    smoking cessation materials not required  Vaping Use   Vaping Use: Never used  Substance Use Topics   Alcohol use: No    Alcohol/week: 0.0 standard drinks of alcohol   Drug use: No     No Known Allergies  Health Maintenance  Topic Date Due   COVID-19 Vaccine (5 - Janssen risk series) 03/04/2022 (Originally 07/01/2021)   Zoster Vaccines- Shingrix (1 of 2) 05/18/2022 (Originally 10/16/1967)   Pneumonia Vaccine 28+ Years old (60 - PPSV23 or PCV20) 02/17/2023 (Originally 04/02/2018)   COLONOSCOPY (Pts 45-43yr Insurance coverage will need to be confirmed)  03/28/2022   MAMMOGRAM  05/06/2022    TETANUS/TDAP  08/27/2022   INFLUENZA VACCINE  Completed   DEXA SCAN  Completed   Hepatitis C Screening  Completed   HPV VACCINES  Aged Out    Chart Review Today: I personally reviewed active problem list, medication list, allergies, family history, social history, health maintenance, notes from last encounter, lab results, imaging with the patient/caregiver today.   Review of Systems  Constitutional: Negative.   HENT: Negative.    Eyes: Negative.   Respiratory: Negative.    Cardiovascular: Negative.   Gastrointestinal: Negative.   Endocrine: Negative.   Genitourinary: Negative.   Musculoskeletal: Negative.   Skin: Negative.   Allergic/Immunologic: Negative.   Neurological: Negative.   Hematological: Negative.   Psychiatric/Behavioral: Negative.    All other systems reviewed and are negative.    Objective:   Vitals:   02/23/22 0943 02/23/22 0957  BP: (!) 190/94 (!) 178/92  Pulse: 64   Resp: 16   Temp: 98.3 F (36.8 C)   TempSrc: Oral   SpO2: 95%   Weight: 256 lb 9.6 oz (116.4 kg)   Height: '5\' 9"'$  (1.753 m)     Body mass index is 37.89 kg/m.  Physical Exam Vitals and nursing note reviewed.  Constitutional:      General: She is not in acute distress.    Appearance: Normal appearance. She is well-developed. She is obese. She is not ill-appearing, toxic-appearing or diaphoretic.  HENT:     Head: Normocephalic and atraumatic.     Right Ear: External ear normal.     Left Ear: External ear normal.     Nose: Nose normal.  Eyes:     General: No scleral icterus.       Right eye: No discharge.        Left eye: No discharge.     Conjunctiva/sclera: Conjunctivae normal.     Pupils: Pupils are equal, round, and reactive to light.  Neck:     Trachea: No tracheal deviation.  Cardiovascular:     Rate and Rhythm: Normal rate and regular rhythm.     Pulses: Normal pulses.     Heart sounds: Normal heart sounds. No murmur heard.    No friction rub. No gallop.   Pulmonary:     Effort: Pulmonary effort is normal. No respiratory distress.     Breath sounds: Normal breath sounds. No stridor. No wheezing, rhonchi or rales.  Abdominal:     General: Bowel sounds are normal.     Palpations: Abdomen is soft.  Musculoskeletal:     Cervical back: Normal range of motion.     Right lower leg: No edema.     Left lower leg: No edema.  Skin:    General: Skin is warm  and dry.     Capillary Refill: Capillary refill takes less than 2 seconds.     Findings: No rash.  Neurological:     Mental Status: She is alert. Mental status is at baseline.     Motor: No abnormal muscle tone.     Coordination: Coordination normal.     Gait: Gait normal.  Psychiatric:        Mood and Affect: Mood normal.        Behavior: Behavior normal.         Assessment & Plan:   1. Accelerated hypertension Has been generally well controlled until earlier this year. Feb f/up visit BP was high, she had run out of meds for 1 weeks, meds were refilled and BP today remains elevated She is asx, no signs or sx concerning today for end organ damage Will do labs and adjust meds with close f/up  D/C lisinopril-HCTZ Continue HCTZ 25 mg - same dose Start valsartan 160 mg once daily and then increase to 320 if BP still >160 Encouraged her to monitor BP at home, avoid salt, increase physical activity as long as asx and well tolerated  - COMPLETE METABOLIC PANEL WITH GFR - hydrochlorothiazide (HYDRODIURIL) 25 MG tablet; Take 1 tablet (25 mg total) by mouth daily.  Dispense: 60 tablet; Refill: 0 - valsartan (DIOVAN) 160 MG tablet; Take 1 tablet (160 mg total) by mouth daily.  Dispense: 90 tablet; Refill: 0  2. Hyperlipidemia LDL goal <100 Due for labs, on statin good compliance no SE or concerns - COMPLETE METABOLIC PANEL WITH GFR - Lipid panel  3. Need for shingles vaccine  - Zoster Vaccine Adjuvanted [facility]) injection; Inject 0.5 mLs into the muscle once for 1 dose.  Dispense:  0.5 mL; Refill: 0  4. Need for influenza vaccination Done today no adverse Rxn - Flu Vaccine QUAD High Dose(Fluad)  5. Need for pneumococcal vaccination Not done today will address or administer at her f/up appt    Return for 2-3 week nurse bp recheck and 3 month routine f/up OV.   Delsa Grana, PA-C 02/23/22 10:15 AM

## 2022-02-23 NOTE — Patient Instructions (Signed)
How to Take Your Blood Pressure Blood pressure measures how strongly your blood is pressing against the walls of your arteries. Arteries are blood vessels that carry blood from your heart throughout your body. You can take your blood pressure at home with a machine. You may need to check your blood pressure at home: To check if you have high blood pressure (hypertension). To check your blood pressure over time. To make sure your blood pressure medicine is working. Supplies needed: Blood pressure machine, or monitor. A chair to sit in. This should be a chair where you can sit upright with your back supported. Do not sit on a soft couch or an armchair. Table or desk. Small notebook. Pencil or pen. How to prepare Avoid these things for 30 minutes before checking your blood pressure: Having drinks with caffeine in them, such as coffee or tea. Drinking alcohol. Eating. Smoking. Exercising. Do these things five minutes before checking your blood pressure: Go to the bathroom and pee (urinate). Sit in a chair. Be quiet. Do not talk. How to take your blood pressure Follow the instructions that came with your machine. If you have a digital blood pressure monitor, these may be the instructions: Sit up straight. Place your feet on the floor. Do not cross your ankles or legs. Rest your left arm at the level of your heart. You may rest it on a table, desk, or chair. Pull up your shirt sleeve. Wrap the blood pressure cuff around the upper part of your left arm. The cuff should be 1 inch (2.5 cm) above your elbow. It is best to wrap the cuff around bare skin. Fit the cuff snugly around your arm, but not too tightly. You should be able to place only one finger between the cuff and your arm. Place the cord so that it rests in the bend of your elbow. Press the power button. Sit quietly while the cuff fills with air and loses air. Write down the numbers on the screen. Wait 2-3 minutes and then repeat  steps 1-10. What do the numbers mean? Two numbers make up your blood pressure. The first number is called systolic pressure. The second is called diastolic pressure. An example of a blood pressure reading is "120 over 80" (or 120/80). If you are an adult and do not have a medical condition, use this guide to find out if your blood pressure is normal: Normal First number: below 120. Second number: below 80. Elevated First number: 120-129. Second number: below 80. Hypertension stage 1 First number: 130-139. Second number: 80-89. Hypertension stage 2 First number: 140 or above. Second number: 90 or above. Your blood pressure is above normal even if only the first or only the second number is above normal. Follow these instructions at home: Medicines Take over-the-counter and prescription medicines only as told by your doctor. Tell your doctor if your medicine is causing side effects. General instructions Check your blood pressure as often as your doctor tells you to. Check your blood pressure at the same time every day. Take your monitor to your next doctor's appointment. Your doctor will: Make sure you are using it correctly. Make sure it is working right. Understand what your blood pressure numbers should be. Keep all follow-up visits. General tips You will need a blood pressure machine or monitor. Your doctor can suggest a monitor. You can buy one at a drugstore or online. When choosing one: Choose one with an arm cuff. Choose one that wraps around your   upper arm. Only one finger should fit between your arm and the cuff. Do not choose one that measures your blood pressure from your wrist or finger. Where to find more information American Heart Association: www.heart.org Contact a doctor if: Your blood pressure keeps being high. Your blood pressure is suddenly low. Get help right away if: Your first blood pressure number is higher than 180. Your second blood pressure number is  higher than 120. These symptoms may be an emergency. Do not wait to see if the symptoms will go away. Get help right away. Call 911. Summary Check your blood pressure at the same time every day. Avoid caffeine, alcohol, smoking, and exercise for 30 minutes before checking your blood pressure. Make sure you understand what your blood pressure numbers should be. This information is not intended to replace advice given to you by your health care provider. Make sure you discuss any questions you have with your health care provider. Document Revised: 02/17/2021 Document Reviewed: 02/17/2021 Elsevier Patient Education  2023 Elsevier Inc.  

## 2022-02-24 LAB — COMPLETE METABOLIC PANEL WITH GFR
AG Ratio: 1.4 (calc) (ref 1.0–2.5)
ALT: 22 U/L (ref 6–29)
AST: 20 U/L (ref 10–35)
Albumin: 4.2 g/dL (ref 3.6–5.1)
Alkaline phosphatase (APISO): 60 U/L (ref 37–153)
BUN/Creatinine Ratio: 17 (calc) (ref 6–22)
BUN: 17 mg/dL (ref 7–25)
CO2: 37 mmol/L — ABNORMAL HIGH (ref 20–32)
Calcium: 10.2 mg/dL (ref 8.6–10.4)
Chloride: 101 mmol/L (ref 98–110)
Creat: 1.03 mg/dL — ABNORMAL HIGH (ref 0.60–1.00)
Globulin: 2.9 g/dL (calc) (ref 1.9–3.7)
Glucose, Bld: 94 mg/dL (ref 65–99)
Potassium: 3.5 mmol/L (ref 3.5–5.3)
Sodium: 142 mmol/L (ref 135–146)
Total Bilirubin: 0.7 mg/dL (ref 0.2–1.2)
Total Protein: 7.1 g/dL (ref 6.1–8.1)
eGFR: 57 mL/min/{1.73_m2} — ABNORMAL LOW (ref >=60)

## 2022-02-24 LAB — LIPID PANEL
Cholesterol: 151 mg/dL (ref <200)
HDL: 46 mg/dL — ABNORMAL LOW (ref >=50)
LDL Cholesterol (Calc): 81 mg/dL (calc)
Non-HDL Cholesterol (Calc): 105 mg/dL (calc) (ref <130)
Total CHOL/HDL Ratio: 3.3 (calc) (ref <5.0)
Triglycerides: 142 mg/dL (ref <150)

## 2022-03-09 ENCOUNTER — Telehealth: Payer: Self-pay | Admitting: *Deleted

## 2022-03-09 ENCOUNTER — Other Ambulatory Visit: Payer: Self-pay | Admitting: *Deleted

## 2022-03-09 DIAGNOSIS — Z800 Family history of malignant neoplasm of digestive organs: Secondary | ICD-10-CM

## 2022-03-09 DIAGNOSIS — Z86010 Personal history of colonic polyps: Secondary | ICD-10-CM

## 2022-03-09 MED ORDER — NA SULFATE-K SULFATE-MG SULF 17.5-3.13-1.6 GM/177ML PO SOLN: 1.0000 | Freq: Once | ORAL | 0 refills | Status: AC

## 2022-03-09 NOTE — Telephone Encounter (Signed)
Gastroenterology Pre-Procedure Review  Request Date: 04/25/2022 Requesting Physician: Dr. Vicente Males  PATIENT REVIEW QUESTIONS: The patient responded to the following health history questions as indicated:    1. Are you having any GI issues? no 2. Do you have a personal history of Polyps? yes (over 10 polys removed on 03/28/2021) 3. Do you have a family history of Colon Cancer or Polyps? yes (mother had colon cancer) 4. Diabetes Mellitus? no 5. Joint replacements in the past 12 months?no 6. Major health problems in the past 3 months?no 7. Any artificial heart valves, MVP, or defibrillator?no    MEDICATIONS & ALLERGIES:    Patient reports the following regarding taking any anticoagulation/antiplatelet therapy:   Plavix, Coumadin, Eliquis, Xarelto, Lovenox, Pradaxa, Brilinta, or Effient? no Aspirin? yes (81 mg)  Patient confirms/reports the following medications:  Current Outpatient Medications  Medication Sig Dispense Refill   aspirin EC 81 MG tablet Take 1 tablet (81 mg total) by mouth daily.     atenolol (TENORMIN) 50 MG tablet TAKE 1 TABLET EVERY DAY 90 tablet 0   atorvastatin (LIPITOR) 40 MG tablet TAKE 1 TABLET EVERY DAY 90 tablet 3   cholecalciferol (VITAMIN D) 1000 units tablet Take 1,000 Units by mouth daily.     fluticasone (FLONASE) 50 MCG/ACT nasal spray USE 2 SPRAYS IN EACH NOSTRIL EVERY DAY 48 g 2   hydrochlorothiazide (HYDRODIURIL) 25 MG tablet Take 1 tablet (25 mg total) by mouth daily. 60 tablet 0   valsartan (DIOVAN) 160 MG tablet Take 1 tablet (160 mg total) by mouth daily. 90 tablet 0   No current facility-administered medications for this visit.    Patient confirms/reports the following allergies:  No Known Allergies  No orders of the defined types were placed in this encounter.   AUTHORIZATION INFORMATION Primary Insurance: 1D#: Group #:  Secondary Insurance: 1D#: Group #:  SCHEDULE INFORMATION: Date: 04/25/2022 Time: Location: [place]

## 2022-03-10 MED ORDER — PEG 3350-KCL-NA BICARB-NACL 420 G PO SOLR: ORAL | 0 refills | Status: DC

## 2022-03-10 NOTE — Progress Notes (Signed)
suprep

## 2022-03-10 NOTE — Addendum Note (Signed)
Addended by: Wayna Chalet on: 03/10/2022 11:22 AM   Modules accepted: Orders

## 2022-03-16 ENCOUNTER — Ambulatory Visit: Payer: Medicare HMO

## 2022-03-16 VITALS — BP 152/84 | HR 64

## 2022-03-16 DIAGNOSIS — I10 Essential (primary) hypertension: Secondary | ICD-10-CM

## 2022-03-16 NOTE — Progress Notes (Addendum)
Patient here today for blood pressure check.  She is currently on HCTZ 25 and Vaslsartan 160.  Patient denies any side effects.  Blood pressure today at first check was 162/92 pulse 64.  Second reading was 146/84 and third reading by different CMA was 152/84.  According to patients last note if still eleveated greater than 160 would increase valsartan to 320.  Patient does state blood pressure readings seem to be lower in afternoons.  Spoke with Kristeen Miss regarding readings, we will go ahead and send in '320mg'$  and patient will continue to monitor at home.  Patient has scheduled an afternoon followup appointment to be seen by provider in two weeks.

## 2022-03-20 ENCOUNTER — Ambulatory Visit: Payer: Self-pay

## 2022-03-20 DIAGNOSIS — I10 Essential (primary) hypertension: Secondary | ICD-10-CM

## 2022-03-20 MED ORDER — VALSARTAN 320 MG PO TABS: 320.0000 mg | ORAL_TABLET | Freq: Every day | ORAL | 1 refills | Status: DC

## 2022-03-20 NOTE — Addendum Note (Signed)
Addended by: Delsa Grana on: 03/20/2022 01:21 PM   Modules accepted: Orders

## 2022-03-20 NOTE — Telephone Encounter (Signed)
BP check on 03/16/22 NOTE: Patient here today for blood pressure check.  She is currently on HCTZ 25 and Vaslsartan 160.  Patient denies any side effects.  Blood pressure today at first check was 162/92 pulse 64.  Second reading was 146/84 and third reading by different CMA was 152/84.  According to patients last note if still eleveated greater than 160 would increase valsartan to 320.  Patient does state blood pressure readings seem to be lower in afternoons.  Spoke with Kristeen Miss regarding readings, we will go ahead and send in '320mg'$  and patient will continue to monitor at home.  Patient has scheduled an afternoon followup appointment to be seen by provider in two weeks.  Pt is wondering if you are sending a new one? Until today she is still doing '160mg'$  and BP reading at home have been running low 140s/80s

## 2022-03-20 NOTE — Telephone Encounter (Signed)
Pt verbalized understanding and will be rescheduling her 2 week follow up.

## 2022-03-20 NOTE — Telephone Encounter (Signed)
Patient called, left VM to return the call to the office to discuss medication with a nurse.  Note from [place], [place] on 03/16/22 for BP check: Patient here today for blood pressure check.  She is currently on HCTZ 25 and Vaslsartan 160.  Patient denies any side effects.  Blood pressure today at first check was 162/92 pulse 64.  Second reading was 146/84 and third reading by different CMA was 152/84.  According to patients last note if still eleveated greater than 160 would increase valsartan to 320.  Patient does state blood pressure readings seem to be lower in afternoons.  Spoke with Kristeen Miss regarding readings, we will go ahead and send in '320mg'$  and patient will continue to monitor at home.  Patient has scheduled an afternoon followup appointment to be seen by provider in two weeks.  Summary: Medication questions   valsartan (DIOVAN) 160 MG tablet Pt called because she has questions about this medication, she wants to know if she can take two of these a day per most recent discussion with PCP on September 28th. Says that a nurse told her that she needed to take 320 MG daily.   Best contact: [phone]

## 2022-03-20 NOTE — Telephone Encounter (Signed)
Summary: Medication questions   valsartan (DIOVAN) 160 MG tablet Pt called because she has questions about this medication, she wants to know if she can take two of these a day per most recent discussion with PCP on September 28th. Says that a nurse told her that she needed to take 320 MG daily.   Best contact: [phone]           Pt called back. Read OV 03/16/22 for BP check. Do not see that Valsartan 320 was sent to pt's pharmacy. Routing note to office.  Reason for Disposition  Prescription request for new medicine (not a refill)  Answer Assessment - Initial Assessment Questions 1. DRUG NAME: "What medicine do you need to have refilled?"     Valsartan 320 mg was not called in to pharmacy 2. REFILLS REMAINING: "How many refills are remaining?" (Note: The label on the medicine or pill bottle will show how many refills are remaining. If there are no refills remaining, then a renewal may be needed.)     N/a 3. EXPIRATION DATE: "What is the expiration date?" (Note: The label states when the prescription will expire, and thus can no longer be refilled.)     N/a 4. PRESCRIBING HCP: "Who prescribed it?" Reason: If prescribed by specialist, call should be referred to that group.     N/a 5. SYMPTOMS: "Do you have any symptoms?"     N/a 6. PREGNANCY: "Is there any chance that you are pregnant?" "When was your last menstrual period?"     N/a  Protocols used: Medication Refill and Renewal Call-A-AH

## 2022-03-30 ENCOUNTER — Ambulatory Visit: Payer: Medicare HMO | Admitting: Nurse Practitioner

## 2022-04-04 ENCOUNTER — Ambulatory Visit (INDEPENDENT_AMBULATORY_CARE_PROVIDER_SITE_OTHER): Payer: Medicare HMO | Admitting: Family Medicine

## 2022-04-04 ENCOUNTER — Encounter: Payer: Self-pay | Admitting: Family Medicine

## 2022-04-04 VITALS — BP 156/88 | HR 72 | Temp 98.1°F | Resp 16 | Ht 69.0 in | Wt 259.3 lb

## 2022-04-04 DIAGNOSIS — I10 Essential (primary) hypertension: Secondary | ICD-10-CM | POA: Diagnosis not present

## 2022-04-04 MED ORDER — VALSARTAN-HYDROCHLOROTHIAZIDE 320-25 MG PO TABS: 1.0000 | ORAL_TABLET | Freq: Every day | ORAL | 3 refills | Status: DC

## 2022-04-04 MED ORDER — HYDROCHLOROTHIAZIDE 25 MG PO TABS: 25.0000 mg | ORAL_TABLET | Freq: Every day | ORAL | 0 refills | Status: DC

## 2022-04-04 NOTE — Progress Notes (Signed)
Name: Bethany Bowman   MRN: [id number]    DOB: Nov 11, 1950   Date:04/04/2022       Progress Note  Chief Complaint  Patient presents with   Follow-up   Hypertension    Pt states at home BP Readings 120s-130s. Only once was up in the 140s     Subjective:   Bethany Bowman is a 71 y.o. female, presents to clinic for BP/med recheck   BP Readings from Last 5 Encounters:  04/04/22 (!) 156/88  03/16/22 (!) 152/84  02/23/22 (!) 178/92  08/01/21 (!) 168/90  03/28/21 116/68    Tx changes increased valsartan 02/23/22 she presented with high BP - was taking atenolol and lisinopril-HCTZ 20-25 Changed to valsartan and HCTZ with continued atenolol She did a nurse BP recheck and BP was still high so valsartan dose was maxed out - here today to recheck  Pt reports home BP readings have been in the120-130's range.  She has only seen one higher reading 144.  Taking meds as directed w/o problems, reports she is compliant with med changes. Pt denies chest pain, SOB, dizziness, or heart palpitations.  Denies medication side effects.  She is extremely anxious about coming here today to recheck her BP  She had her cuff and bp readings all wrapped up together but didn't bring them with her.   She will come back with cuff to compare if her readings at home are accurate     Current Outpatient Medications:    aspirin EC 81 MG tablet, Take 1 tablet (81 mg total) by mouth daily., Disp: , Rfl:    atenolol (TENORMIN) 50 MG tablet, TAKE 1 TABLET EVERY DAY, Disp: 90 tablet, Rfl: 0   atorvastatin (LIPITOR) 40 MG tablet, TAKE 1 TABLET EVERY DAY, Disp: 90 tablet, Rfl: 3   cholecalciferol (VITAMIN D) 1000 units tablet, Take 1,000 Units by mouth daily., Disp: , Rfl:    fluticasone (FLONASE) 50 MCG/ACT nasal spray, USE 2 SPRAYS IN EACH NOSTRIL EVERY DAY, Disp: 48 g, Rfl: 2   hydrochlorothiazide (HYDRODIURIL) 25 MG tablet, Take 1 tablet (25 mg total) by mouth daily., Disp: 60 tablet, Rfl: 0   polyethylene  glycol-electrolytes (NULYTELY) 420 g solution, Prepare according to package instructions. Starting at 5:00 PM: Drink one 8 oz glass of mixture every 15 minutes until you finish half of the jug. Five hours prior to procedure, drink 8 oz glass of mixture every 15 minutes until it is all gone. Make sure you do not drink anything 4 hours prior to your procedure., Disp: 4000 mL, Rfl: 0   valsartan (DIOVAN) 320 MG tablet, Take 1 tablet (320 mg total) by mouth daily., Disp: 90 tablet, Rfl: 1  Patient Active Problem List   Diagnosis Date Noted   Genetic testing 05/11/2021   Family history of colon cancer 04/20/2021   Family history of prostate cancer 04/20/2021   Family history of lung cancer 04/20/2021   Personal history of colonic polyps 04/20/2021   Allergic rhinitis 01/28/2021   Obesity (BMI 35.0-39.9 without comorbidity) 03/15/2017   Hyperlipidemia LDL goal <100 02/18/2016   Insomnia 11/25/2014   Benign essential HTN 07/01/2007    Past Surgical History:  Procedure Laterality Date   COLONOSCOPY WITH PROPOFOL N/A 12/28/2016   Procedure: COLONOSCOPY WITH PROPOFOL;  Surgeon: Jonathon Bellows, MD;  Location: [facility] ENDOSCOPY;  Service: Endoscopy;  Laterality: N/A;   COLONOSCOPY WITH PROPOFOL N/A 01/15/2018   Procedure: COLONOSCOPY WITH PROPOFOL;  Surgeon: Jonathon Bellows, MD;  Location: [facility]  ENDOSCOPY;  Service: Gastroenterology;  Laterality: N/A;   COLONOSCOPY WITH PROPOFOL N/A 03/28/2021   Procedure: COLONOSCOPY WITH PROPOFOL;  Surgeon: Jonathon Bellows, MD;  Location: [facility] ENDOSCOPY;  Service: Gastroenterology;  Laterality: N/A;    Family History  Problem Relation Age of Onset   Colon cancer Mother 46   Hypertension Mother    Alcohol abuse Father    Hypertension Father    Hypertension Sister    Hypertension Brother    Prostate cancer Brother        dx 84s metastatic   Lung cancer Brother 34   Hypertension Brother    Hypertension Brother    Breast cancer Neg Hx     Social History   Tobacco Use    Smoking status: Former    Packs/day: 0.50    Years: 12.00    Total pack years: 6.00    Types: Cigarettes    Quit date: 08/03/1981    Years since quitting: 40.6   Smokeless tobacco: Never   Tobacco comments:    smoking cessation materials not required  Vaping Use   Vaping Use: Never used  Substance Use Topics   Alcohol use: No    Alcohol/week: 0.0 standard drinks of alcohol   Drug use: No     No Known Allergies  Health Maintenance  Topic Date Due   COLONOSCOPY (Pts 45-68yr Insurance coverage will need to be confirmed)  03/28/2022   COVID-19 Vaccine (5 - Janssen risk series) 04/20/2022 (Originally 07/01/2021)   Zoster Vaccines- Shingrix (1 of 2) 05/18/2022 (Originally 10/16/1967)   Pneumonia Vaccine 73 Years old (3 - PPSV23 or PCV20) 02/17/2023 (Originally 04/02/2018)   MAMMOGRAM  05/06/2022   TETANUS/TDAP  08/27/2022   INFLUENZA VACCINE  Completed   DEXA SCAN  Completed   Hepatitis C Screening  Completed   HPV VACCINES  Aged Out    Chart Review Today: I personally reviewed active problem list, medication list, allergies, family history, social history, health maintenance, notes from last encounter, lab results, imaging with the patient/caregiver today.   Review of Systems  Constitutional: Negative.   HENT: Negative.    Eyes: Negative.   Respiratory: Negative.    Cardiovascular: Negative.   Gastrointestinal: Negative.   Endocrine: Negative.   Genitourinary: Negative.   Musculoskeletal: Negative.   Skin: Negative.   Allergic/Immunologic: Negative.   Neurological: Negative.   Hematological: Negative.   Psychiatric/Behavioral: Negative.    All other systems reviewed and are negative.    Objective:   Vitals:   04/04/22 1349 04/04/22 1358  BP: (!) 162/90 (!) 156/88  Pulse: 72   Resp: 16   Temp: 98.1 F (36.7 C)   TempSrc: Oral   SpO2: 97%   Weight: 259 lb 4.8 oz (117.6 kg)   Height: '5\' 9"'$  (1.753 m)     Body mass index is 38.29 kg/m.  Physical  Exam Vitals and nursing note reviewed.  Constitutional:      General: She is not in acute distress.    Appearance: Normal appearance. She is well-developed. She is obese. She is not ill-appearing, toxic-appearing or diaphoretic.  HENT:     Head: Normocephalic and atraumatic.     Nose: Nose normal.  Eyes:     General:        Right eye: No discharge.        Left eye: No discharge.     Conjunctiva/sclera: Conjunctivae normal.  Neck:     Trachea: No tracheal deviation.  Cardiovascular:     Rate  and Rhythm: Normal rate and regular rhythm.     Pulses: Normal pulses.     Heart sounds: Normal heart sounds. No murmur heard.    No friction rub. No gallop.  Pulmonary:     Effort: Pulmonary effort is normal. No respiratory distress.     Breath sounds: Normal breath sounds. No stridor. No wheezing, rhonchi or rales.  Skin:    General: Skin is warm and dry.     Findings: No rash.  Neurological:     Mental Status: She is alert.     Motor: No abnormal muscle tone.     Coordination: Coordination normal.  Psychiatric:        Attention and Perception: Attention normal.        Mood and Affect: Affect normal. Mood is anxious.        Speech: Speech normal.        Behavior: Behavior normal. Behavior is cooperative.         Assessment & Plan:   1. Primary hypertension BP here in office remains elevated put pt is extremely anxious about her BP now and particularly coming into office since it has previously been well controlled She has continued her atenolol, HCTZ 25 mg daily in the morning and increase valsartan to 320 a little less than 2 weeks ago At home with her blood pressure monitoring she did notice that the valsartan dose when increased from 160 to 320 it did improve her blood pressure and her readings were more often 130s only when reading systolic blood pressure was 144. Difficult to say if her blood pressure reading in clinic is elevated due to anxiety, I would like to ensure that  her home blood pressure cuff is accurate so she is going to come in to clinic with that to do a side-by-side -then we can confirm that her home blood pressure monitoring is what we will refer to when determining if BP and meds are at goal.  Plan to continue atenolol, HCTZ at same dose and valsartan at same dose - 320  When she runs out of valsartan she will get the combo dose below which is post-dated for Dec - hydrochlorothiazide (HYDRODIURIL) 25 MG tablet; Take 1 tablet (25 mg total) by mouth daily.  Dispense: 60 tablet; Refill: 0 - valsartan-hydrochlorothiazide (DIOVAN-HCT) 320-25 MG tablet; Take 1 tablet by mouth daily.  Dispense: 90 tablet; Refill: 3    Return for 1-2 wk BP check with comparing to her home BP cuff (cancel dec appt, keep march appt).   Delsa Grana, PA-C 04/04/22 2:00 PM

## 2022-04-04 NOTE — Patient Instructions (Signed)
Blood Pressure Record Sheet  Blood pressure log Date: _______________________ a.m. _____________________(1st reading) _____________________(2nd reading) p.m. _____________________(1st reading) _____________________(2nd reading) Date: _______________________ a.m. _____________________(1st reading) _____________________(2nd reading) p.m. _____________________(1st reading) _____________________(2nd reading) Date: _______________________ a.m. _____________________(1st reading) _____________________(2nd reading) p.m. _____________________(1st reading) _____________________(2nd reading) Date: _______________________ a.m. _____________________(1st reading) _____________________(2nd reading) p.m. _____________________(1st reading) _____________________(2nd reading) Date: _______________________ a.m. _____________________(1st reading) _____________________(2nd reading) p.m. _____________________(1st reading) _____________________(2nd reading) Date: _______________________ a.m. _____________________(1st reading) _____________________(2nd reading) p.m. _____________________(1st reading) _____________________(2nd reading) Date: _______________________ a.m. _____________________(1st reading) _____________________(2nd reading) p.m. _____________________(1st reading) _____________________(2nd reading) Date: _______________________ a.m. _____________________(1st reading) _____________________(2nd reading) p.m. _____________________(1st reading) _____________________(2nd reading) Date: _______________________ a.m. _____________________(1st reading) _____________________(2nd reading) p.m. _____________________(1st reading) _____________________(2nd reading) Date: _______________________ a.m. _____________________(1st reading) _____________________(2nd reading) p.m. _____________________(1st reading) _____________________(2nd reading)  How to Take Your Blood Pressure Blood pressure measures how  strongly your blood is pressing against the walls of your arteries. Arteries are blood vessels that carry blood from your heart throughout your body. You can take your blood pressure at home with a machine. You may need to check your blood pressure at home: To check if you have high blood pressure (hypertension). To check your blood pressure over time. To make sure your blood pressure medicine is working. Supplies needed: Blood pressure machine, or monitor. A chair to sit in. This should be a chair where you can sit upright with your back supported. Do not sit on a soft couch or an armchair. Table or desk. Small notebook. Pencil or pen. How to prepare Avoid these things for 30 minutes before checking your blood pressure: Having drinks with caffeine in them, such as coffee or tea. Drinking alcohol. Eating. Smoking. Exercising. Do these things five minutes before checking your blood pressure: Go to the bathroom and pee (urinate). Sit in a chair. Be quiet. Do not talk. How to take your blood pressure Follow the instructions that came with your machine. If you have a digital blood pressure monitor, these may be the instructions: Sit up straight. Place your feet on the floor. Do not cross your ankles or legs. Rest your left arm at the level of your heart. You may rest it on a table, desk, or chair. Pull up your shirt sleeve. Wrap the blood pressure cuff around the upper part of your left arm. The cuff should be 1 inch (2.5 cm) above your elbow. It is best to wrap the cuff around bare skin. Fit the cuff snugly around your arm, but not too tightly. You should be able to place only one finger between the cuff and your arm. Place the cord so that it rests in the bend of your elbow. Press the power button. Sit quietly while the cuff fills with air and loses air. Write down the numbers on the screen. Wait 2-3 minutes and then repeat steps 1-10. What do the numbers mean? Two numbers make up  your blood pressure. The first number is called systolic pressure. The second is called diastolic pressure. An example of a blood pressure reading is "120 over 80" (or 120/80). If you are an adult and do not have a medical condition, use this guide to find out if your blood pressure is normal: Normal First number: below 120. Second number: below 80. Elevated First number: 120-129. Second number: below 80. Hypertension stage 1 First number: 130-139. Second number: 80-89. Hypertension stage 2 First number: 140 or above. Second number: 70 or above. Your blood pressure is above normal even if only the first or only the second number is  above normal. Follow these instructions at home: Medicines Take over-the-counter and prescription medicines only as told by your doctor. Tell your doctor if your medicine is causing side effects. General instructions Check your blood pressure as often as your doctor tells you to. Check your blood pressure at the same time every day. Take your monitor to your next doctor's appointment. Your doctor will: Make sure you are using it correctly. Make sure it is working right. Understand what your blood pressure numbers should be. Keep all follow-up visits. General tips You will need a blood pressure machine or monitor. Your doctor can suggest a monitor. You can buy one at a drugstore or online. When choosing one: Choose one with an arm cuff. Choose one that wraps around your upper arm. Only one finger should fit between your arm and the cuff. Do not choose one that measures your blood pressure from your wrist or finger. Where to find more information American Heart Association: www.heart.org Contact a doctor if: Your blood pressure keeps being high. Your blood pressure is suddenly low. Get help right away if: Your first blood pressure number is higher than 180. Your second blood pressure number is higher than 120. These symptoms may be an emergency. Do  not wait to see if the symptoms will go away. Get help right away. Call 911. Summary Check your blood pressure at the same time every day. Avoid caffeine, alcohol, smoking, and exercise for 30 minutes before checking your blood pressure. Make sure you understand what your blood pressure numbers should be. This information is not intended to replace advice given to you by your health care provider. Make sure you discuss any questions you have with your health care provider. Document Revised: 02/17/2021 Document Reviewed: 02/17/2021 Elsevier Patient Education  [place].

## 2022-04-06 ENCOUNTER — Ambulatory Visit: Payer: Medicare HMO | Admitting: Family Medicine

## 2022-04-14 ENCOUNTER — Telehealth: Payer: Self-pay

## 2022-04-14 NOTE — Telephone Encounter (Signed)
Patient lvm stated that she received a phone call informing her that her out of pocket estimate for her colonoscopy will be around $295.  Returned patients call.  LVM informing her that she does not need to pay $295 up front.  Her insurance will be billed first and then anything not covered she will receive the bill for and can make payment arrangements.  Thanks,  [place], [place]

## 2022-04-18 ENCOUNTER — Ambulatory Visit: Payer: Medicare HMO

## 2022-04-18 VITALS — BP 168/81 | HR 81

## 2022-04-18 DIAGNOSIS — I10 Essential (primary) hypertension: Secondary | ICD-10-CM

## 2022-04-24 ENCOUNTER — Encounter: Payer: Self-pay | Admitting: Gastroenterology

## 2022-04-25 ENCOUNTER — Ambulatory Visit: Payer: Medicare HMO | Admitting: Certified Registered"

## 2022-04-25 ENCOUNTER — Encounter
Admission: RE | Disposition: A | Discharge status: Home / Self Care | Payer: Self-pay | Source: Home / Self Care | Attending: Gastroenterology

## 2022-04-25 ENCOUNTER — Encounter: Payer: Self-pay | Admitting: Gastroenterology

## 2022-04-25 ENCOUNTER — Ambulatory Visit
Admission: RE | Admit: 2022-04-25 | Discharge: 2022-04-25 | Disposition: A | Discharge status: Home / Self Care | Payer: Medicare HMO | Attending: Gastroenterology | Admitting: Gastroenterology

## 2022-04-25 DIAGNOSIS — D126 Benign neoplasm of colon, unspecified: Secondary | ICD-10-CM | POA: Diagnosis not present

## 2022-04-25 DIAGNOSIS — Z87891 Personal history of nicotine dependence: Secondary | ICD-10-CM | POA: Insufficient documentation

## 2022-04-25 DIAGNOSIS — Z86010 Personal history of colonic polyps: Secondary | ICD-10-CM

## 2022-04-25 DIAGNOSIS — Z1211 Encounter for screening for malignant neoplasm of colon: Secondary | ICD-10-CM | POA: Insufficient documentation

## 2022-04-25 DIAGNOSIS — Z801 Family history of malignant neoplasm of trachea, bronchus and lung: Secondary | ICD-10-CM | POA: Diagnosis not present

## 2022-04-25 DIAGNOSIS — K219 Gastro-esophageal reflux disease without esophagitis: Secondary | ICD-10-CM | POA: Diagnosis not present

## 2022-04-25 DIAGNOSIS — Z800 Family history of malignant neoplasm of digestive organs: Secondary | ICD-10-CM | POA: Insufficient documentation

## 2022-04-25 DIAGNOSIS — E785 Hyperlipidemia, unspecified: Secondary | ICD-10-CM | POA: Insufficient documentation

## 2022-04-25 DIAGNOSIS — I10 Essential (primary) hypertension: Secondary | ICD-10-CM | POA: Diagnosis not present

## 2022-04-25 DIAGNOSIS — Z8042 Family history of malignant neoplasm of prostate: Secondary | ICD-10-CM | POA: Diagnosis not present

## 2022-04-25 DIAGNOSIS — D125 Benign neoplasm of sigmoid colon: Secondary | ICD-10-CM | POA: Insufficient documentation

## 2022-04-25 DIAGNOSIS — K635 Polyp of colon: Secondary | ICD-10-CM | POA: Diagnosis not present

## 2022-04-25 DIAGNOSIS — F419 Anxiety disorder, unspecified: Secondary | ICD-10-CM | POA: Diagnosis not present

## 2022-04-25 HISTORY — PX: COLONOSCOPY WITH PROPOFOL: SHX5780

## 2022-04-25 SURGERY — COLONOSCOPY WITH PROPOFOL
Anesthesia: General

## 2022-04-25 MED ORDER — LIDOCAINE HCL (CARDIAC) PF 100 MG/5ML IV SOSY
PREFILLED_SYRINGE | INTRAVENOUS | Status: DC | PRN
  Administered 2022-04-25: 100 mg via INTRAVENOUS

## 2022-04-25 MED ORDER — SIMETHICONE 40 MG/0.6ML PO SUSP
ORAL | Status: DC | PRN
  Administered 2022-04-25 (×2): 120 mL

## 2022-04-25 MED ORDER — SODIUM CHLORIDE 0.9 % IV SOLN
INTRAVENOUS | Status: DC
  Administered 2022-04-25: 1000 mL via INTRAVENOUS

## 2022-04-25 MED ORDER — PROPOFOL 500 MG/50ML IV EMUL
INTRAVENOUS | Status: DC | PRN
  Administered 2022-04-25: 165 ug/kg/min via INTRAVENOUS

## 2022-04-25 MED ORDER — PROPOFOL 10 MG/ML IV BOLUS
INTRAVENOUS | Status: DC | PRN
  Administered 2022-04-25: 60 mg via INTRAVENOUS

## 2022-04-25 NOTE — Op Note (Signed)
[facility] Patient Name: Bethany Bowman Procedure Date: 04/25/2022 8:18 AM MRN: [id number] Account #: [id number] Date of Birth: [birth year] Admit Type: Outpatient Age: 71 Room: [facility] ENDO ROOM 2 Gender: Female Note Status: Finalized Instrument Name: Park Meo [id number] Procedure:             Colonoscopy Indications:           Surveillance: History of numerous (> 10) adenomas on                         last colonoscopy (< 3 yrs) Providers:             Jonathon Bellows MD, MD Medicines:             Monitored Anesthesia Care Complications:         No immediate complications. Procedure:             Pre-Anesthesia Assessment:                        - Prior to the procedure, a History and Physical was                         performed, and patient medications, allergies and                         sensitivities were reviewed. The patient's tolerance                         of previous anesthesia was reviewed.                        - The risks and benefits of the procedure and the                         sedation options and risks were discussed with the                         patient. All questions were answered and informed                         consent was obtained.                        - ASA Grade Assessment: II - A patient with mild                         systemic disease.                        After obtaining informed consent, the colonoscope was                         passed under direct vision. Throughout the procedure,                         the patient's blood pressure, pulse, and oxygen                         saturations were monitored continuously. The  Colonoscope was introduced through the anus and                         advanced to the the cecum, identified by the                         appendiceal orifice. The colonoscopy was performed                         without difficulty. The patient tolerated the                          procedure well. The quality of the bowel preparation                         was excellent. The appendiceal orifice was                         photographed. Findings:      The perianal and digital rectal examinations were normal.      A 5 mm polyp was found in the sigmoid colon. The polyp was sessile. The       polyp was removed with a cold snare. Resection and retrieval were       complete.      The exam was otherwise without abnormality on direct and retroflexion       views. Impression:            - One 5 mm polyp in the sigmoid colon, removed with a                         cold snare. Resected and retrieved.                        - The examination was otherwise normal on direct and                         retroflexion views. Recommendation:        - Discharge patient to home (with escort).                        - Resume previous diet.                        - Continue present medications.                        - Await pathology results.                        - Repeat colonoscopy in 3 years for surveillance. Procedure Code(s):     --- Professional ---                        [phone], Colonoscopy, flexible; with removal of                         tumor(s), polyp(s), or other lesion(s) by snare                         technique Diagnosis  Code(s):     --- Professional ---                        Z86.010, Personal history of colonic polyps                        D12.5, Benign neoplasm of sigmoid colon CPT copyright 2022 American Medical Association. All rights reserved. The codes documented in this report are preliminary and upon coder review may  be revised to meet current compliance requirements. Jonathon Bellows, MD Jonathon Bellows MD, MD 04/25/2022 8:41:57 AM This report has been signed electronically. Number of Addenda: 0 Note Initiated On: 04/25/2022 8:18 AM Scope Withdrawal Time: 0 hours 8 minutes 58 seconds  Total Procedure Duration: 0 hours 10 minutes 14 seconds   Estimated Blood Loss:  Estimated blood loss: none.      [facility]

## 2022-04-25 NOTE — Anesthesia Preprocedure Evaluation (Signed)
Anesthesia Evaluation  Patient identified by MRN, date of birth, ID band Patient awake    Reviewed: Allergy & Precautions, H&P , NPO status , Patient's Chart, lab work & pertinent test results, reviewed documented beta blocker date and time   Airway Mallampati: II   Neck ROM: full    Dental  (+) Poor Dentition   Pulmonary neg pulmonary ROS, former smoker   Pulmonary exam normal        Cardiovascular Exercise Tolerance: Poor hypertension, On Medications negative cardio ROS Normal cardiovascular exam Rhythm:regular Rate:Normal     Neuro/Psych   Anxiety     negative neurological ROS  negative psych ROS   GI/Hepatic Neg liver ROS,GERD  Medicated,,  Endo/Other  negative endocrine ROS    Renal/GU negative Renal ROS  negative genitourinary   Musculoskeletal   Abdominal   Peds  Hematology negative hematology ROS (+)   Anesthesia Other Findings Past Medical History: No date: Allergy 11/25/2014: Anxiety No date: Family history of colon cancer No date: Family history of lung cancer No date: Family history of prostate cancer No date: GERD (gastroesophageal reflux disease) No date: Hyperlipidemia No date: Hypertension No date: Personal history of colonic polyps Past Surgical History: 12/28/2016: COLONOSCOPY WITH PROPOFOL; N/A     Comment:  Procedure: COLONOSCOPY WITH PROPOFOL;  Surgeon: Jonathon Bellows, MD;  Location: [facility] ENDOSCOPY;  Service:               Endoscopy;  Laterality: N/A; 01/15/2018: COLONOSCOPY WITH PROPOFOL; N/A     Comment:  Procedure: COLONOSCOPY WITH PROPOFOL;  Surgeon: Jonathon Bellows, MD;  Location: [facility] ENDOSCOPY;  Service:               Gastroenterology;  Laterality: N/A; 03/28/2021: COLONOSCOPY WITH PROPOFOL; N/A     Comment:  Procedure: COLONOSCOPY WITH PROPOFOL;  Surgeon: Jonathon Bellows, MD;  Location: [facility] ENDOSCOPY;  Service:               Gastroenterology;   Laterality: N/A; BMI    Body Mass Index: 36.91 kg/m     Reproductive/Obstetrics negative OB ROS                             Anesthesia Physical Anesthesia Plan  ASA: 3  Anesthesia Plan: General   Post-op Pain Management:    Induction:   PONV Risk Score and Plan:   Airway Management Planned:   Additional Equipment:   Intra-op Plan:   Post-operative Plan:   Informed Consent: I have reviewed the patients History and Physical, chart, labs and discussed the procedure including the risks, benefits and alternatives for the proposed anesthesia with the patient or authorized representative who has indicated his/her understanding and acceptance.     Dental Advisory Given  Plan Discussed with: CRNA  Anesthesia Plan Comments:        Anesthesia Quick Evaluation

## 2022-04-25 NOTE — H&P (Signed)
Jonathon Bellows, MD [address] [place], [place], [place], [address] [address], [place], [place], [place], [address] Phone: [phone]  Fax: [phone]  Primary Care Physician:  Delsa Grana, PA-C   Pre-Procedure History & Physical: HPI:  Bethany Bowman is a 71 y.o. female is here for an colonoscopy.   Past Medical History:  Diagnosis Date   Allergy    Anxiety 11/25/2014   Family history of colon cancer    Family history of lung cancer    Family history of prostate cancer    GERD (gastroesophageal reflux disease)    Hyperlipidemia    Hypertension    Personal history of colonic polyps     Past Surgical History:  Procedure Laterality Date   COLONOSCOPY WITH PROPOFOL N/A 12/28/2016   Procedure: COLONOSCOPY WITH PROPOFOL;  Surgeon: Jonathon Bellows, MD;  Location: [facility] ENDOSCOPY;  Service: Endoscopy;  Laterality: N/A;   COLONOSCOPY WITH PROPOFOL N/A 01/15/2018   Procedure: COLONOSCOPY WITH PROPOFOL;  Surgeon: Jonathon Bellows, MD;  Location: [facility] ENDOSCOPY;  Service: Gastroenterology;  Laterality: N/A;   COLONOSCOPY WITH PROPOFOL N/A 03/28/2021   Procedure: COLONOSCOPY WITH PROPOFOL;  Surgeon: Jonathon Bellows, MD;  Location: [facility] ENDOSCOPY;  Service: Gastroenterology;  Laterality: N/A;    Prior to Admission medications   Medication Sig Start Date End Date Taking? Authorizing Provider  aspirin EC 81 MG tablet Take 1 tablet (81 mg total) by mouth daily. 03/15/17  Yes Lada, Satira Anis, MD  atenolol (TENORMIN) 50 MG tablet TAKE 1 TABLET EVERY DAY 01/17/22  Yes Delsa Grana, PA-C  atorvastatin (LIPITOR) 40 MG tablet TAKE 1 TABLET EVERY DAY 09/08/21  Yes Delsa Grana, PA-C  cholecalciferol (VITAMIN D) 1000 units tablet Take 1,000 Units by mouth daily.   Yes [provider]  fluticasone (FLONASE) 50 MCG/ACT nasal spray USE 2 SPRAYS IN EACH NOSTRIL EVERY DAY 10/12/21  Yes Delsa Grana, PA-C  hydrochlorothiazide (HYDRODIURIL) 25 MG tablet Take 1 tablet (25 mg total) by mouth daily. 04/04/22   Yes Delsa Grana, PA-C  polyethylene glycol-electrolytes (NULYTELY) 420 g solution Prepare according to package instructions. Starting at 5:00 PM: Drink one 8 oz glass of mixture every 15 minutes until you finish half of the jug. Five hours prior to procedure, drink 8 oz glass of mixture every 15 minutes until it is all gone. Make sure you do not drink anything 4 hours prior to your procedure. 03/10/22  Yes Jonathon Bellows, MD  valsartan (DIOVAN) 320 MG tablet Take 1 tablet (320 mg total) by mouth daily. 03/20/22  Yes Delsa Grana, PA-C  valsartan-hydrochlorothiazide (DIOVAN-HCT) 320-25 MG tablet Take 1 tablet by mouth daily. 06/05/22  Yes Delsa Grana, PA-C    Allergies as of 03/09/2022   (No Known Allergies)    Family History  Problem Relation Age of Onset   Colon cancer Mother 60   Hypertension Mother    Alcohol abuse Father    Hypertension Father    Hypertension Sister    Hypertension Brother    Prostate cancer Brother        dx 35s metastatic   Lung cancer Brother 68   Hypertension Brother    Hypertension Brother    Breast cancer Neg Hx     Social History   Socioeconomic History   Marital status: Married    Spouse name: Lavera Guise   Number of children: 2   Years of education: some college   Highest education level: 12th grade  Occupational History   Not on file  Tobacco  Use   Smoking status: Former    Packs/day: 0.50    Years: 12.00    Total pack years: 6.00    Types: Cigarettes    Quit date: 08/03/1981    Years since quitting: 40.7   Smokeless tobacco: Never   Tobacco comments:    smoking cessation materials not required  Vaping Use   Vaping Use: Never used  Substance and Sexual Activity   Alcohol use: No    Alcohol/week: 0.0 standard drinks of alcohol   Drug use: No   Sexual activity: Not Currently  Other Topics Concern   Not on file  Social History Narrative   Not on file   Social Determinants of Health   Financial Resource Strain: Low Risk  (02/15/2021)    Overall Financial Resource Strain (CARDIA)    Difficulty of Paying Living Expenses: Not hard at all  Food Insecurity: No Food Insecurity (02/15/2021)   Hunger Vital Sign    Worried About Running Out of Food in the Last Year: Never true    Ran Out of Food in the Last Year: Never true  Transportation Needs: No Transportation Needs (02/15/2021)   PRAPARE - Hydrologist (Medical): No    Lack of Transportation (Non-Medical): No  Physical Activity: Inactive (02/15/2021)   Exercise Vital Sign    Days of Exercise per Week: 0 days    Minutes of Exercise per Session: 0 min  Stress: No Stress Concern Present (02/15/2021)   [place]    Feeling of Stress : Not at all  Social Connections: Moderately Integrated (02/15/2021)   Social Connection and Isolation Panel [NHANES]    Frequency of Communication with Friends and Family: More than three times a week    Frequency of Social Gatherings with Friends and Family: More than three times a week    Attends Religious Services: More than 4 times per year    Active Member of [employer] or Organizations: No    Attends Archivist Meetings: Never    Marital Status: Married  Human resources officer Violence: Not At Risk (02/15/2021)   Humiliation, Afraid, Rape, and Kick questionnaire    Fear of Current or Ex-Partner: No    Emotionally Abused: No    Physically Abused: No    Sexually Abused: No    Review of Systems: See HPI, otherwise negative ROS  Physical Exam: BP (!) 191/105   Pulse 62   Temp (!) 97.2 F (36.2 C) (Temporal)   Resp 18   Ht '5\' 9"'$  (1.753 m)   Wt 113.4 kg   LMP  (LMP Unknown)   SpO2 100%   BMI 36.91 kg/m  General:   Alert,  pleasant and cooperative in NAD Head:  Normocephalic and atraumatic. Neck:  Supple; no masses or thyromegaly. Lungs:  Clear throughout to auscultation, normal respiratory effort.    Heart:  +S1, +S2, Regular rate and  rhythm, No edema. Abdomen:  Soft, nontender and nondistended. Normal bowel sounds, without guarding, and without rebound.   Neurologic:  Alert and  oriented x4;  grossly normal neurologically.  Impression/Plan: LAURENE MELENDREZ is here for an colonoscopy to be performed for surveillance due to prior history of colon polyps   Risks, benefits, limitations, and alternatives regarding  colonoscopy have been reviewed with the patient.  Questions have been answered.  All parties agreeable.   Jonathon Bellows, MD  04/25/2022, 8:16 AM

## 2022-04-25 NOTE — Anesthesia Postprocedure Evaluation (Signed)
Anesthesia Post Note  Patient: ERNA BROSSARD  Procedure(s) Performed: COLONOSCOPY WITH PROPOFOL  Patient location during evaluation: PACU Anesthesia Type: General Level of consciousness: awake and alert Pain management: pain level controlled Vital Signs Assessment: post-procedure vital signs reviewed and stable Respiratory status: spontaneous breathing, nonlabored ventilation, respiratory function stable and patient connected to nasal cannula oxygen Cardiovascular status: blood pressure returned to baseline and stable Postop Assessment: no apparent nausea or vomiting Anesthetic complications: no   No notable events documented.   Last Vitals:  Vitals:   04/25/22 0854 04/25/22 0904  BP: 117/71 124/84  Pulse: (!) 57 (!) 57  Resp: 12 19  Temp:    SpO2: 100% 100%    Last Pain:  Vitals:   04/25/22 0904  TempSrc:   PainSc: 0-No pain                 Molli Barrows

## 2022-04-25 NOTE — Anesthesia Procedure Notes (Signed)
Procedure Name: General with mask airway Date/Time: 04/25/2022 8:47 AM  Performed by: Kelton Pillar, CRNAPre-anesthesia Checklist: Patient identified, Emergency Drugs available, Suction available and Patient being monitored Patient Re-evaluated:Patient Re-evaluated prior to induction Oxygen Delivery Method: Simple face mask Induction Type: IV induction Placement Confirmation: positive ETCO2, CO2 detector and breath sounds checked- equal and bilateral Dental Injury: Teeth and Oropharynx as per pre-operative assessment

## 2022-04-25 NOTE — Transfer of Care (Signed)
Immediate Anesthesia Transfer of Care Note  Patient: Bethany Bowman  Procedure(s) Performed: COLONOSCOPY WITH PROPOFOL  Patient Location: Endoscopy Unit  Anesthesia Type:General  Level of Consciousness: drowsy and patient cooperative  Airway & Oxygen Therapy: Patient Spontanous Breathing and Patient connected to face mask oxygen  Post-op Assessment: Report given to RN and Post -op Vital signs reviewed and stable  Post vital signs: Reviewed and stable  Last Vitals:  Vitals Value Taken Time  BP 117/71 04/25/22 0854  Temp 36.2 C 04/25/22 0844  Pulse 57 04/25/22 0854  Resp 12 04/25/22 0854  SpO2 100 % 04/25/22 0854    Last Pain:  Vitals:   04/25/22 0844  TempSrc: Temporal  PainSc: Asleep         Complications: No notable events documented.

## 2022-04-26 ENCOUNTER — Encounter: Payer: Self-pay | Admitting: Gastroenterology

## 2022-04-26 LAB — SURGICAL PATHOLOGY

## 2022-05-25 ENCOUNTER — Ambulatory Visit: Payer: Medicare HMO | Admitting: Family Medicine

## 2022-06-19 ENCOUNTER — Other Ambulatory Visit: Payer: Self-pay | Admitting: Family Medicine

## 2022-06-19 DIAGNOSIS — I10 Essential (primary) hypertension: Secondary | ICD-10-CM

## 2022-07-10 ENCOUNTER — Other Ambulatory Visit: Payer: Self-pay | Admitting: Family Medicine

## 2022-07-10 DIAGNOSIS — Z1231 Encounter for screening mammogram for malignant neoplasm of breast: Secondary | ICD-10-CM

## 2022-07-25 ENCOUNTER — Ambulatory Visit
Admission: RE | Admit: 2022-07-25 | Discharge: 2022-07-25 | Disposition: A | Discharge status: Home / Self Care | Payer: Medicare HMO | Source: Ambulatory Visit | Attending: Family Medicine | Admitting: Family Medicine

## 2022-07-25 DIAGNOSIS — Z1231 Encounter for screening mammogram for malignant neoplasm of breast: Secondary | ICD-10-CM | POA: Diagnosis not present

## 2022-08-15 ENCOUNTER — Other Ambulatory Visit: Payer: Self-pay | Admitting: Family Medicine

## 2022-08-15 DIAGNOSIS — J0140 Acute pansinusitis, unspecified: Secondary | ICD-10-CM

## 2022-08-15 DIAGNOSIS — J302 Other seasonal allergic rhinitis: Secondary | ICD-10-CM

## 2022-08-24 ENCOUNTER — Ambulatory Visit: Payer: Medicare HMO | Admitting: Family Medicine

## 2022-08-31 ENCOUNTER — Telehealth (INDEPENDENT_AMBULATORY_CARE_PROVIDER_SITE_OTHER): Payer: Medicare HMO | Admitting: Family Medicine

## 2022-08-31 DIAGNOSIS — N1831 Chronic kidney disease, stage 3a: Secondary | ICD-10-CM | POA: Diagnosis not present

## 2022-08-31 DIAGNOSIS — I10 Essential (primary) hypertension: Secondary | ICD-10-CM | POA: Diagnosis not present

## 2022-08-31 DIAGNOSIS — E785 Hyperlipidemia, unspecified: Secondary | ICD-10-CM

## 2022-08-31 DIAGNOSIS — Z6837 Body mass index (BMI) 37.0-37.9, adult: Secondary | ICD-10-CM

## 2022-08-31 NOTE — Progress Notes (Signed)
Name: Bethany Bowman   MRN: [id number]    DOB: 02-27-1951   Date:08/31/2022       Progress Note  Subjective:    Chief Complaint  Chief Complaint  Patient presents with   Follow-up   Hypertension   Hyperlipidemia    I connected with  Donna Christen on 08/31/22 at  9:20 AM EDT by telephone and verified that I am speaking with the correct person using two identifiers.  I discussed the limitations, risks, security and privacy concerns of performing an evaluation and management service by telephone and the availability of in person appointments. Staff also discussed with the patient that there may be a patient responsible charge related to this service. Patient Location: home Provider Location: home office Additional Individuals present: none  HPI  F/up on HLD and HTN BP at home 136/78, 143/79, 141/82, 142/82, 132/82, 123/75 BP Readings from Last 3 Encounters:  04/25/22 124/84  04/18/22 (!) 168/81  04/04/22 (!) 156/88  She has too small of a BP cuff   HLD last labs in spet Lab Results  Component Value Date   CHOL 151 02/23/2022   HDL 46 (L) 02/23/2022   LDLCALC 81 02/23/2022   TRIG 142 02/23/2022   CHOLHDL 3.3 02/23/2022       Patient Active Problem List   Diagnosis Date Noted   Adenomatous polyp of colon 04/25/2022   Genetic testing 05/11/2021   Family history of colon cancer 04/20/2021   Family history of prostate cancer 04/20/2021   Family history of lung cancer 04/20/2021   History of colonic polyps 04/20/2021   Allergic rhinitis 01/28/2021   Obesity (BMI 35.0-39.9 without comorbidity) 03/15/2017   Hyperlipidemia LDL goal <100 02/18/2016   Insomnia 11/25/2014   Benign essential HTN 07/01/2007    Current Outpatient Medications:    aspirin EC 81 MG tablet, Take 1 tablet (81 mg total) by mouth daily., Disp: , Rfl:    atenolol (TENORMIN) 50 MG tablet, TAKE 1 TABLET EVERY DAY, Disp: 90 tablet, Rfl: 3   atorvastatin (LIPITOR) 40 MG tablet, TAKE 1 TABLET EVERY  DAY, Disp: 90 tablet, Rfl: 3   cholecalciferol (VITAMIN D) 1000 units tablet, Take 1,000 Units by mouth daily., Disp: , Rfl:    fluticasone (FLONASE) 50 MCG/ACT nasal spray, USE 2 SPRAYS IN EACH NOSTRIL EVERY DAY, Disp: 48 g, Rfl: 3   valsartan-hydrochlorothiazide (DIOVAN-HCT) 320-25 MG tablet, Take 1 tablet by mouth daily., Disp: 90 tablet, Rfl: 3   hydrochlorothiazide (HYDRODIURIL) 25 MG tablet, Take 1 tablet (25 mg total) by mouth daily. (Patient not taking: Reported on 08/31/2022), Disp: 60 tablet, Rfl: 0   polyethylene glycol-electrolytes (NULYTELY) 420 g solution, Prepare according to package instructions. Starting at 5:00 PM: Drink one 8 oz glass of mixture every 15 minutes until you finish half of the jug. Five hours prior to procedure, drink 8 oz glass of mixture every 15 minutes until it is all gone. Make sure you do not drink anything 4 hours prior to your procedure. (Patient not taking: Reported on 08/31/2022), Disp: 4000 mL, Rfl: 0   valsartan (DIOVAN) 320 MG tablet, Take 1 tablet (320 mg total) by mouth daily. (Patient not taking: Reported on 08/31/2022), Disp: 90 tablet, Rfl: 1 No Known Allergies  Past Surgical History:  Procedure Laterality Date   COLONOSCOPY WITH PROPOFOL N/A 12/28/2016   Procedure: COLONOSCOPY WITH PROPOFOL;  Surgeon: Jonathon Bellows, MD;  Location[facility] ENDOSCOPY;  Service: Endoscopy;  Laterality: N/A;   COLONOSCOPY WITH PROPOFOL N/A  01/15/2018   Procedure: COLONOSCOPY WITH PROPOFOL;  Surgeon: Jonathon Bellows, MD;  Location: [facility] ENDOSCOPY;  Service: Gastroenterology;  Laterality: N/A;   COLONOSCOPY WITH PROPOFOL N/A 03/28/2021   Procedure: COLONOSCOPY WITH PROPOFOL;  Surgeon: Jonathon Bellows, MD;  Location: [facility] ENDOSCOPY;  Service: Gastroenterology;  Laterality: N/A;   COLONOSCOPY WITH PROPOFOL N/A 04/25/2022   Procedure: COLONOSCOPY WITH PROPOFOL;  Surgeon: Jonathon Bellows, MD;  Location: [facility] ENDOSCOPY;  Service: Gastroenterology;  Laterality: N/A;   Family History  Problem  Relation Age of Onset   Colon cancer Mother 59   Hypertension Mother    Alcohol abuse Father    Hypertension Father    Hypertension Sister    Hypertension Brother    Prostate cancer Brother        dx 40s metastatic   Lung cancer Brother 35   Hypertension Brother    Hypertension Brother    Breast cancer Neg Hx    Social History   Socioeconomic History   Marital status: Married    Spouse name: Lavera Guise   Number of children: 2   Years of education: some college   Highest education level: 12th grade  Occupational History   Not on file  Tobacco Use   Smoking status: Former    Packs/day: 0.50    Years: 12.00    Additional pack years: 0.00    Total pack years: 6.00    Types: Cigarettes    Quit date: 08/03/1981    Years since quitting: 41.1   Smokeless tobacco: Never   Tobacco comments:    smoking cessation materials not required  Vaping Use   Vaping Use: Never used  Substance and Sexual Activity   Alcohol use: No    Alcohol/week: 0.0 standard drinks of alcohol   Drug use: No   Sexual activity: Not Currently  Other Topics Concern   Not on file  Social History Narrative   Not on file   Social Determinants of Health   Financial Resource Strain: Low Risk  (02/15/2021)   Overall Financial Resource Strain (CARDIA)    Difficulty of Paying Living Expenses: Not hard at all  Food Insecurity: No Food Insecurity (02/15/2021)   Hunger Vital Sign    Worried About Running Out of Food in the Last Year: Never true    Ran Out of Food in the Last Year: Never true  Transportation Needs: No Transportation Needs (02/15/2021)   PRAPARE - Hydrologist (Medical): No    Lack of Transportation (Non-Medical): No  Physical Activity: Inactive (02/15/2021)   Exercise Vital Sign    Days of Exercise per Week: 0 days    Minutes of Exercise per Session: 0 min  Stress: No Stress Concern Present (02/15/2021)   [place]    Feeling of Stress : Not at all  Social Connections: Moderately Integrated (02/15/2021)   Social Connection and Isolation Panel [NHANES]    Frequency of Communication with Friends and Family: More than three times a week    Frequency of Social Gatherings with Friends and Family: More than three times a week    Attends Religious Services: More than 4 times per year    Active Member of [employer] or Organizations: No    Attends Archivist Meetings: Never    Marital Status: Married  Human resources officer Violence: Not At Risk (02/15/2021)   Humiliation, Afraid, Rape, and Kick questionnaire    Fear of Current or Ex-Partner: No  Emotionally Abused: No    Physically Abused: No    Sexually Abused: No    Chart Review Today: I personally reviewed active problem list, medication list, allergies, family history, social history, health maintenance, notes from last encounter, lab results, imaging with the patient/caregiver today.  Review of Systems  Constitutional: Negative.   HENT: Negative.    Eyes: Negative.   Respiratory: Negative.    Cardiovascular: Negative.   Gastrointestinal: Negative.   Endocrine: Negative.   Genitourinary: Negative.   Musculoskeletal: Negative.   Skin: Negative.   Allergic/Immunologic: Negative.   Neurological: Negative.   Hematological: Negative.   Psychiatric/Behavioral: Negative.    All other systems reviewed and are negative.    Objective:    Virtual encounter, vitals limited, only able to obtain the following: There were no vitals filed for this visit. There is no height or weight on file to calculate BMI. Nursing Note and Vital Signs reviewed.  Physical Exam Vitals and nursing note reviewed.  Constitutional:      General: She is not in acute distress.    Appearance: Normal appearance. She is not ill-appearing, toxic-appearing or diaphoretic.  Neurological:     Mental Status: She is alert.  Psychiatric:        Mood and Affect:  Mood normal.     PE limited by telephone encounter   PHQ2/9:    08/31/2022    8:59 AM 04/04/2022    1:48 PM 02/23/2022    9:42 AM 02/16/2022   10:22 AM 08/01/2021    7:59 AM  Depression screen PHQ 2/9  Decreased Interest 0 0 0 0 0  Down, Depressed, Hopeless 0 0 0 0 0  PHQ - 2 Score 0 0 0 0 0  Altered sleeping 0 0 0 0 0  Tired, decreased energy 0 0 0 0 0  Change in appetite 0 0 0 0 0  Feeling bad or failure about yourself  0 0 0 0 0  Trouble concentrating 0 0 0 0 0  Moving slowly or fidgety/restless 0 0 0 0 0  Suicidal thoughts 0 0 0 0 0  PHQ-9 Score 0 0 0 0 0  Difficult doing work/chores Not difficult at all Not difficult at all Not difficult at all  Not difficult at all   PHQ-2/9 Result is neg, reviewed  Fall Risk:    08/31/2022    8:59 AM 04/04/2022    1:48 PM 02/23/2022    9:42 AM 02/16/2022   10:22 AM 08/01/2021    7:58 AM  Fall Risk   Falls in the past year? 0 0 0 0 0  Number falls in past yr: 0 0 0  0  Injury with Fall? 0 0 0  0  Risk for fall due to : No Fall Risks No Fall Risks No Fall Risks No Fall Risks No Fall Risks  Follow up Falls prevention discussed;Education provided;Falls evaluation completed Falls prevention discussed;Education provided Falls prevention discussed;Education provided Education provided;Falls evaluation completed;Falls prevention discussed Falls prevention discussed     Assessment and Plan:   Problem List Items Addressed This Visit       Other   Hyperlipidemia LDL goal <100    Good compliance with lipitor 40, last labs done 6 months ago - will need annually Continue statin and diet/lifestyle efforts Good tolerance, no SE or concers      Relevant Orders   COMPLETE METABOLIC PANEL WITH GFR   Other Visit Diagnoses     Primary hypertension    -  Primary   home BP readings much better on current meds, continue same and check in office BP when she comes for labs   Relevant Orders   COMPLETE METABOLIC PANEL WITH GFR   Class 2 severe  obesity with serious comorbidity and body mass index (BMI) of 37.0 to 37.9 in adult, unspecified obesity type ([place])       Stage 3a chronic kidney disease ([place])       recheck renal function, she has improved BP, reviewed basics of CKD and managment and preserving kidney health   Relevant Orders   COMPLETE METABOLIC PANEL WITH GFR        I discussed the assessment and treatment plan with the patient. The patient was provided an opportunity to ask questions and all were answered. The patient agreed with the plan and demonstrated an understanding of the instructions.   The patient was advised to call back or seek an in-person evaluation if the symptoms worsen or if the condition fails to improve as anticipated.  I provided 20 minutes of non-face-to-face time during this encounter.  Delsa Grana, PA-C 08/31/22 9:30 AM

## 2022-09-08 ENCOUNTER — Encounter: Payer: Self-pay | Admitting: Family Medicine

## 2022-09-08 NOTE — Assessment & Plan Note (Signed)
Good compliance with lipitor 40, last labs done 6 months ago - will need annually Continue statin and diet/lifestyle efforts Good tolerance, no SE or concers

## 2022-09-25 ENCOUNTER — Other Ambulatory Visit: Payer: Self-pay | Admitting: Family Medicine

## 2022-09-25 DIAGNOSIS — E785 Hyperlipidemia, unspecified: Secondary | ICD-10-CM

## 2022-12-15 ENCOUNTER — Ambulatory Visit (INDEPENDENT_AMBULATORY_CARE_PROVIDER_SITE_OTHER): Payer: Medicare HMO

## 2022-12-15 VITALS — Ht 69.0 in | Wt 249.0 lb

## 2022-12-15 DIAGNOSIS — Z0000 Encounter for general adult medical examination without abnormal findings: Secondary | ICD-10-CM

## 2022-12-15 NOTE — Progress Notes (Signed)
Subjective:   Bethany Bowman is a 72 y.o. female who presents for Medicare Annual (Subsequent) preventive examination.  Visit Complete: Virtual  I connected with  Delorise Jackson on 12/15/22 by a audio enabled telemedicine application and verified that I am speaking with the correct person using two identifiers.  Patient Location: Home  Provider Location: Office/Clinic  I discussed the limitations of evaluation and management by telemedicine. The patient expressed understanding and agreed to proceed.  Patient Medicare AWV questionnaire was completed by the patient on (not done); I have confirmed that all information answered by patient is correct and no changes since this date.  Review of Systems    Cardiac Risk Factors include: advanced age (>97men, >101 women);dyslipidemia;hypertension;obesity (BMI >30kg/m2)    Objective:    Today's Vitals   12/15/22 1036 12/15/22 1038  Weight: 249 lb (112.9 kg)   Height: 5\' 9"  (1.753 m)   PainSc:  5    Body mass index is 36.77 kg/m.     12/15/2022   10:54 AM 04/25/2022    7:55 AM 03/28/2021    7:21 AM 02/15/2021   10:29 AM 12/17/2018    3:01 PM 01/15/2018    7:07 AM 12/13/2017    2:15 PM  Advanced Directives  Does Patient Have a Medical Advance Directive? No No No No No No No  Would patient like information on creating a medical advance directive?   No - Patient declined Yes (MAU/Ambulatory/Procedural Areas - Information given) No - Patient declined No - Patient declined Yes (MAU/Ambulatory/Procedural Areas - Information given)    Current Medications (verified) Outpatient Encounter Medications as of 12/15/2022  Medication Sig   aspirin EC 81 MG tablet Take 1 tablet (81 mg total) by mouth daily.   atenolol (TENORMIN) 50 MG tablet TAKE 1 TABLET EVERY DAY   atorvastatin (LIPITOR) 40 MG tablet TAKE 1 TABLET EVERY DAY   cholecalciferol (VITAMIN D) 1000 units tablet Take 1,000 Units by mouth daily.   fluticasone (FLONASE) 50 MCG/ACT nasal spray  USE 2 SPRAYS IN EACH NOSTRIL EVERY DAY   valsartan-hydrochlorothiazide (DIOVAN-HCT) 320-25 MG tablet Take 1 tablet by mouth daily.   No facility-administered encounter medications on file as of 12/15/2022.    Allergies (verified) Patient has no known allergies.   History: Past Medical History:  Diagnosis Date   Allergy    Anxiety 11/25/2014   Family history of colon cancer    Family history of lung cancer    Family history of prostate cancer    GERD (gastroesophageal reflux disease)    Hyperlipidemia    Hypertension    Personal history of colonic polyps    Past Surgical History:  Procedure Laterality Date   COLONOSCOPY WITH PROPOFOL N/A 12/28/2016   Procedure: COLONOSCOPY WITH PROPOFOL;  Surgeon: Wyline Mood, MD;  Location: [facility] ENDOSCOPY;  Service: Endoscopy;  Laterality: N/A;   COLONOSCOPY WITH PROPOFOL N/A 01/15/2018   Procedure: COLONOSCOPY WITH PROPOFOL;  Surgeon: Wyline Mood, MD;  Location: [facility] ENDOSCOPY;  Service: Gastroenterology;  Laterality: N/A;   COLONOSCOPY WITH PROPOFOL N/A 03/28/2021   Procedure: COLONOSCOPY WITH PROPOFOL;  Surgeon: Wyline Mood, MD;  Location: [facility] ENDOSCOPY;  Service: Gastroenterology;  Laterality: N/A;   COLONOSCOPY WITH PROPOFOL N/A 04/25/2022   Procedure: COLONOSCOPY WITH PROPOFOL;  Surgeon: Wyline Mood, MD;  Location: [facility] ENDOSCOPY;  Service: Gastroenterology;  Laterality: N/A;   Family History  Problem Relation Age of Onset   Colon cancer Mother 53   Hypertension Mother    Alcohol abuse Father  Hypertension Father    Hypertension Sister    Hypertension Brother    Prostate cancer Brother        dx 47s metastatic   Lung cancer Brother 25   Hypertension Brother    Hypertension Brother    Breast cancer Neg Hx    Social History   Socioeconomic History   Marital status: Married    Spouse name: Delano Metz   Number of children: 2   Years of education: some college   Highest education level: 12th grade  Occupational History   Not on  file  Tobacco Use   Smoking status: Former    Packs/day: 0.50    Years: 12.00    Additional pack years: 0.00    Total pack years: 6.00    Types: Cigarettes    Quit date: 08/03/1981    Years since quitting: 41.3   Smokeless tobacco: Never   Tobacco comments:    smoking cessation materials not required  Vaping Use   Vaping Use: Never used  Substance and Sexual Activity   Alcohol use: No    Alcohol/week: 0.0 standard drinks of alcohol   Drug use: No   Sexual activity: Not Currently  Other Topics Concern   Not on file  Social History Narrative   Not on file   Social Determinants of Health   Financial Resource Strain: Low Risk  (12/15/2022)   Overall Financial Resource Strain (CARDIA)    Difficulty of Paying Living Expenses: Not hard at all  Food Insecurity: No Food Insecurity (12/15/2022)   Hunger Vital Sign    Worried About Running Out of Food in the Last Year: Never true    Ran Out of Food in the Last Year: Never true  Transportation Needs: No Transportation Needs (12/15/2022)   PRAPARE - Administrator, Civil Service (Medical): No    Lack of Transportation (Non-Medical): No  Physical Activity: Insufficiently Active (12/15/2022)   Exercise Vital Sign    Days of Exercise per Week: 1 day    Minutes of Exercise per Session: 30 min  Stress: No Stress Concern Present (12/15/2022)   [employer] of Occupational Health - Occupational Stress Questionnaire    Feeling of Stress : Not at all  Social Connections: Moderately Integrated (12/15/2022)   Social Connection and Isolation Panel [NHANES]    Frequency of Communication with Friends and Family: More than three times a week    Frequency of Social Gatherings with Friends and Family: More than three times a week    Attends Religious Services: More than 4 times per year    Active Member of [employer] or Organizations: No    Attends Banker Meetings: Never    Marital Status: Married    Tobacco  Counseling Counseling given: Not Answered Tobacco comments: smoking cessation materials not required   Clinical Intake:  Pre-visit preparation completed: Yes  Pain : 0-10 Pain Score: 5  Pain Type: Chronic pain Pain Location: Shoulder Pain Orientation: Left Pain Descriptors / Indicators: Aching, Sharp Pain Onset: In the past 7 days Pain Frequency: Intermittent Pain Relieving Factors: IBU, tylenol, heat  Pain Relieving Factors: IBU, tylenol, heat  BMI - recorded: 36.77 Nutritional Status: BMI > 30  Obese Nutritional Risks: None Diabetes: No  How often do you need to have someone help you when you read instructions, pamphlets, or other written materials from your doctor or pharmacy?: 1 - Never  Interpreter Needed?: No  Comments: lives with husband and nephew Information entered by ::  B.Hyder Deman,LPN   Activities of Daily Living    12/15/2022   10:57 AM 08/31/2022    8:59 AM  In your present state of health, do you have any difficulty performing the following activities:  Hearing? 0 0  Vision? 0 0  Difficulty concentrating or making decisions? 0 0  Walking or climbing stairs? 0 0  Dressing or bathing? 0 0  Doing errands, shopping? 0 0  Preparing Food and eating ? N   Using the Toilet? N   In the past six months, have you accidently leaked urine? N   Do you have problems with loss of bowel control? N   Managing your Medications? N   Managing your Finances? N   Housekeeping or managing your Housekeeping? N     Patient Care Team: Danelle Berry, PA-C as PCP - General (Family Medicine)  Indicate any recent Medical Services you may have received from other than [facility] providers in the past year (date may be approximate).     Assessment:   This is a routine wellness examination for [place].  Hearing/Vision screen Hearing Screening - Comments:: Adequate hearing Vision Screening - Comments:: Adequate vision w/glasses [facility]  Dietary issues and exercise activities  discussed:     Goals Addressed             This Visit's Progress    DIET - INCREASE WATER INTAKE   On track    Recommend to drink at least 6-8 8oz glasses of water per day.     Increase physical activity   Not on track    Recommend increasing physical activity to at least 3 days per week and utilize Silver Sneakers through [facility]       Depression Screen    12/15/2022   10:48 AM 08/31/2022    8:59 AM 04/04/2022    1:48 PM 02/23/2022    9:42 AM 02/16/2022   10:22 AM 08/01/2021    7:59 AM 02/15/2021   10:20 AM  PHQ 2/9 Scores  PHQ - 2 Score 0 0 0 0 0 0 0  PHQ- 9 Score  0 0 0 0 0     Fall Risk    12/15/2022   10:43 AM 08/31/2022    8:59 AM 04/04/2022    1:48 PM 02/23/2022    9:42 AM 02/16/2022   10:22 AM  Fall Risk   Falls in the past year? 0 0 0 0 0  Number falls in past yr: 0 0 0 0   Injury with Fall? 0 0 0 0   Risk for fall due to : No Fall Risks No Fall Risks No Fall Risks No Fall Risks No Fall Risks  Follow up Education provided;Falls prevention discussed Falls prevention discussed;Education provided;Falls evaluation completed Falls prevention discussed;Education provided Falls prevention discussed;Education provided Education provided;Falls evaluation completed;Falls prevention discussed    MEDICARE RISK AT HOME:  Medicare Risk at Home - 12/15/22 1044     Any stairs in or around the home? No    If so, are there any without handrails? No    Home free of loose throw rugs in walkways, pet beds, electrical cords, etc? Yes    Adequate lighting in your home to reduce risk of falls? Yes    Life alert? No    Use of a cane, walker or w/c? No    Grab bars in the bathroom? Yes    Shower chair or bench in shower? Yes    Elevated toilet seat or a handicapped  toilet? Yes             TIMED UP AND GO:  Was the test performed?  No    Cognitive Function:        12/15/2022   10:58 AM 02/16/2022   10:21 AM 12/17/2018    3:05 PM 12/13/2017    2:17 PM  6CIT Screen  What  Year? 0 points 0 points 0 points 0 points  What month? 0 points 0 points 0 points 0 points  What time? 0 points 0 points 0 points 0 points  Count back from 20 0 points 0 points 0 points 0 points  Months in reverse 0 points 0 points 0 points 0 points  Repeat phrase 0 points 0 points 0 points 0 points  Total Score 0 points 0 points 0 points 0 points    Immunizations Immunization History  Administered Date(s) Administered   Fluad Quad(high Dose 65+) 07/13/2020, 02/23/2022   Influenza Split 01/11/2010   Influenza, High Dose Seasonal PF 02/24/2017, 04/16/2019   Influenza, Seasonal, Injecte, Preservative Fre 12/19/2010, 01/28/2014   Influenza-Unspecified 02/15/2015   Janssen (J&J) SARS-COV-2 Vaccination 08/30/2019   MODERNA COVID-19 SARS-COV-2 PEDS BIVALENT BOOSTER 6Y-11Y 05/19/2020, 11/10/2020, 05/06/2021   Pneumococcal Conjugate-13 05/07/2014   Pneumococcal Polysaccharide-23 04/02/2013   Td 05/07/2014   Tdap 08/26/2012   Zoster Recombinat (Shingrix) 02/23/2022, 07/04/2022   Zoster, Live 02/12/2012    TDAP status: Up to date  Flu Vaccine status: Up to date  Pneumococcal vaccine status: Up to date  Covid-19 vaccine status: Completed vaccines  Qualifies for Shingles Vaccine? Yes   Zostavax completed Yes   Shingrix Completed?: Yes  Screening Tests Health Maintenance  Topic Date Due   COVID-19 Vaccine (5 - 2023-24 season) 02/17/2022   Pneumonia Vaccine 21+ Years old (3 of 3 - PPSV23 or PCV20) 02/17/2023 (Originally 05/08/2019)   INFLUENZA VACCINE  01/18/2023   MAMMOGRAM  07/26/2023   Medicare Annual Wellness (AWV)  12/15/2023   DTaP/Tdap/Td (3 - Td or Tdap) 05/07/2024   Colonoscopy  04/25/2025   DEXA SCAN  Completed   Hepatitis C Screening  Completed   Zoster Vaccines- Shingrix  Completed   HPV VACCINES  Aged Out    Health Maintenance  Health Maintenance Due  Topic Date Due   COVID-19 Vaccine (5 - 2023-24 season) 02/17/2022    Colorectal cancer screening: Type of  screening: Colonoscopy. Completed yes. Repeat every 5-10 years  Mammogram status: Completed yes. Repeat every year  Bone Density status: Completed yes. Results reflect: Bone density results: NORMAL. Repeat every 5 years.  Lung Cancer Screening: (Low Dose CT Chest recommended if Age 12-80 years, 20 pack-year currently smoking OR have quit w/in 15years.) does not qualify.   Lung Cancer Screening Referral: no  Additional Screening:  Hepatitis C Screening: does not qualify; Completed yes  Vision Screening: Recommended annual ophthalmology exams for early detection of glaucoma and other disorders of the eye. Is the patient up to date with their annual eye exam?  Yes  Who is the provider or what is the name of the office in which the patient attends annual eye exams? [facility] If pt is not established with a provider, would they like to be referred to a provider to establish care? No .   Dental Screening: Recommended annual dental exams for proper oral hygiene  Diabetic Foot Exam: n/a  Community Resource Referral / Chronic Care Management: CRR required this visit?  No   CCM required this visit?  Appt scheduled with PCP  Plan:     I have personally reviewed and noted the following in the patient's chart:   Medical and social history Use of alcohol, tobacco or illicit drugs  Current medications and supplements including opioid prescriptions. Patient is not currently taking opioid prescriptions. Functional ability and status Nutritional status Physical activity Advanced directives List of other physicians Hospitalizations, surgeries, and ER visits in previous 12 months Vitals Screenings to include cognitive, depression, and falls Referrals and appointments  In addition, I have reviewed and discussed with patient certain preventive protocols, quality metrics, and best practice recommendations. A written personalized care plan for preventive services as well as general  preventive health recommendations were provided to patient.     Sue Lush, LPN   [id number]/60/[id number]   After Visit Summary: (MyChart) Due to this being a telephonic visit, the after visit summary with patients personalized plan was offered to patient via MyChart   Nurse Notes: The patient states she is doing well and has no concerns or questions at this time.

## 2022-12-15 NOTE — Patient Instructions (Signed)
Bethany Bowman , Thank you for taking time to come for your Medicare Wellness Visit. I appreciate your ongoing commitment to your health goals. Please review the following plan we discussed and let me know if I can assist you in the future.   These are the goals we discussed:  Goals      DIET - INCREASE WATER INTAKE     Recommend to drink at least 6-8 8oz glasses of water per day.     Increase physical activity     Recommend increasing physical activity to at least 3 days per week and utilize [employer] through [place]        This is a list of the screening recommended for you and due dates:  Health Maintenance  Topic Date Due   COVID-19 Vaccine (5 - 2023-24 season) 02/17/2022   Pneumonia Vaccine (3 of 3 - PPSV23 or PCV20) 02/17/2023*   Flu Shot  01/18/2023   Mammogram  07/26/2023   Medicare Annual Wellness Visit  12/15/2023   DTaP/Tdap/Td vaccine (3 - Td or Tdap) 05/07/2024   Colon Cancer Screening  04/25/2025   DEXA scan (bone density measurement)  Completed   Hepatitis C Screening  Completed   Zoster (Shingles) Vaccine  Completed   HPV Vaccine  Aged Out  *Topic was postponed. The date shown is not the original due date.    Advanced directives: no mailed to pt  Conditions/risks identified: low falls risk  Next appointment: Follow up in one year for your annual wellness visit 12/27/2023 @ 10:45am telephone   Preventive Care 65 Years and Older, Female Preventive care refers to lifestyle choices and visits with your health care provider that can promote health and wellness. What does preventive care include? A yearly physical exam. This is also called an annual well check. Dental exams once or twice a year. Routine eye exams. Ask your health care provider how often you should have your eyes checked. Personal lifestyle choices, including: Daily care of your teeth and gums. Regular physical activity. Eating a healthy diet. Avoiding tobacco and drug use. Limiting alcohol  use. Practicing safe sex. Taking low-dose aspirin every day. Taking vitamin and mineral supplements as recommended by your health care provider. What happens during an annual well check? The services and screenings done by your health care provider during your annual well check will depend on your age, overall health, lifestyle risk factors, and family history of disease. Counseling  Your health care provider may ask you questions about your: Alcohol use. Tobacco use. Drug use. Emotional well-being. Home and relationship well-being. Sexual activity. Eating habits. History of falls. Memory and ability to understand (cognition). Work and work Astronomer. Reproductive health. Screening  You may have the following tests or measurements: Height, weight, and BMI. Blood pressure. Lipid and cholesterol levels. These may be checked every 5 years, or more frequently if you are over 27 years old. Skin check. Lung cancer screening. You may have this screening every year starting at age 39 if you have a 30-pack-year history of smoking and currently smoke or have quit within the past 15 years. Fecal occult blood test (FOBT) of the stool. You may have this test every year starting at age 20. Flexible sigmoidoscopy or colonoscopy. You may have a sigmoidoscopy every 5 years or a colonoscopy every 10 years starting at age 46. Hepatitis C blood test. Hepatitis B blood test. Sexually transmitted disease (STD) testing. Diabetes screening. This is done by checking your blood sugar (glucose) after you  have not eaten for a while (fasting). You may have this done every 1-3 years. Bone density scan. This is done to screen for osteoporosis. You may have this done starting at age 44. Mammogram. This may be done every 1-2 years. Talk to your health care provider about how often you should have regular mammograms. Talk with your health care provider about your test results, treatment options, and if necessary,  the need for more tests. Vaccines  Your health care provider may recommend certain vaccines, such as: Influenza vaccine. This is recommended every year. Tetanus, diphtheria, and acellular pertussis (Tdap, Td) vaccine. You may need a Td booster every 10 years. Zoster vaccine. You may need this after age 70. Pneumococcal 13-valent conjugate (PCV13) vaccine. One dose is recommended after age 94. Pneumococcal polysaccharide (PPSV23) vaccine. One dose is recommended after age 110. Talk to your health care provider about which screenings and vaccines you need and how often you need them. This information is not intended to replace advice given to you by your health care provider. Make sure you discuss any questions you have with your health care provider. Document Released: 07/02/2015 Document Revised: 02/23/2016 Document Reviewed: 04/06/2015 Elsevier Interactive Patient Education  2017 [place] Prevention in the Home Falls can cause injuries. They can happen to people of all ages. There are many things you can do to make your home safe and to help prevent falls. What can I do on the outside of my home? Regularly fix the edges of walkways and driveways and fix any cracks. Remove anything that might make you trip as you walk through a door, such as a raised step or threshold. Trim any bushes or trees on the path to your home. Use bright outdoor lighting. Clear any walking paths of anything that might make someone trip, such as rocks or tools. Regularly check to see if handrails are loose or broken. Make sure that both sides of any steps have handrails. Any raised decks and porches should have guardrails on the edges. Have any leaves, snow, or ice cleared regularly. Use sand or salt on walking paths during winter. Clean up any spills in your garage right away. This includes oil or grease spills. What can I do in the bathroom? Use night lights. Install grab bars by the toilet and in the  tub and shower. Do not use towel bars as grab bars. Use non-skid mats or decals in the tub or shower. If you need to sit down in the shower, use a plastic, non-slip stool. Keep the floor dry. Clean up any water that spills on the floor as soon as it happens. Remove soap buildup in the tub or shower regularly. Attach bath mats securely with double-sided non-slip rug tape. Do not have throw rugs and other things on the floor that can make you trip. What can I do in the bedroom? Use night lights. Make sure that you have a light by your bed that is easy to reach. Do not use any sheets or blankets that are too big for your bed. They should not hang down onto the floor. Have a firm chair that has side arms. You can use this for support while you get dressed. Do not have throw rugs and other things on the floor that can make you trip. What can I do in the kitchen? Clean up any spills right away. Avoid walking on wet floors. Keep items that you use a lot in easy-to-reach places. If you need  to reach something above you, use a strong step stool that has a grab bar. Keep electrical cords out of the way. Do not use floor polish or wax that makes floors slippery. If you must use wax, use non-skid floor wax. Do not have throw rugs and other things on the floor that can make you trip. What can I do with my stairs? Do not leave any items on the stairs. Make sure that there are handrails on both sides of the stairs and use them. Fix handrails that are broken or loose. Make sure that handrails are as long as the stairways. Check any carpeting to make sure that it is firmly attached to the stairs. Fix any carpet that is loose or worn. Avoid having throw rugs at the top or bottom of the stairs. If you do have throw rugs, attach them to the floor with carpet tape. Make sure that you have a light switch at the top of the stairs and the bottom of the stairs. If you do not have them, ask someone to add them for  you. What else can I do to help prevent falls? Wear shoes that: Do not have high heels. Have rubber bottoms. Are comfortable and fit you well. Are closed at the toe. Do not wear sandals. If you use a stepladder: Make sure that it is fully opened. Do not climb a closed stepladder. Make sure that both sides of the stepladder are locked into place. Ask someone to hold it for you, if possible. Clearly mark and make sure that you can see: Any grab bars or handrails. First and last steps. Where the edge of each step is. Use tools that help you move around (mobility aids) if they are needed. These include: Canes. Walkers. Scooters. Crutches. Turn on the lights when you go into a dark area. Replace any light bulbs as soon as they burn out. Set up your furniture so you have a clear path. Avoid moving your furniture around. If any of your floors are uneven, fix them. If there are any pets around you, be aware of where they are. Review your medicines with your doctor. Some medicines can make you feel dizzy. This can increase your chance of falling. Ask your doctor what other things that you can do to help prevent falls. This information is not intended to replace advice given to you by your health care provider. Make sure you discuss any questions you have with your health care provider. Document Released: 04/01/2009 Document Revised: 11/11/2015 Document Reviewed: 07/10/2014 Elsevier Interactive Patient Education  2017 [employer].

## 2022-12-20 ENCOUNTER — Ambulatory Visit: Payer: Self-pay

## 2022-12-20 NOTE — Telephone Encounter (Signed)
We let the cost center know we had no avail that we only had 1 dr when the call was made. [place] Spaniel they would refer them to urgent care or ER.

## 2022-12-20 NOTE — Telephone Encounter (Signed)
  Summary: back pain   Stumbled over a branch but did not fall, now experiencing back pain. Please advise.       Chief Complaint: low back pain requesting medication  Symptoms: stumbled over branch in yard on Sunday. Monday noted low back pain and now worsening level 8 pain . OTC medications not effective heat works short term. Reports pain low with laying worsening with sitting and standing . Difficulty managing pain with walking.  Frequency: Monday  Pertinent Negatives: Patient denies N/T does not radiate to legs Disposition: [] ED /[x] Urgent Care (no appt availability in office) / [] Appointment(In office/virtual)/ []  [place] [facility]/ [] Home Care/ [] Refused Recommended Disposition /[] [facility]/ []  Follow-up with PCP Additional Notes:   No available appt today or Friday. Jeanene Erb [facility] Rhonda and no available OV noted until July 9. Recommended UC. Unsure of disposition.     Reason for Disposition  [1] SEVERE back pain (e.g., excruciating, unable to do any normal activities) AND [2] not improved 2 hours after pain medicine  Answer Assessment - Initial Assessment Questions 1. ONSET: "When did the pain begin?"      Monday  2. LOCATION: "Where does it hurt?" (upper, mid or lower back)     Low back pian  3. SEVERITY: "How bad is the pain?"  (e.g., Scale 1-10; mild, moderate, or severe)   - MILD (1-3): Doesn't interfere with normal activities.    - MODERATE (4-7): Interferes with normal activities or awakens from sleep.    - SEVERE (8-10): Excruciating pain, unable to do any normal activities.      Level  8  4. PATTERN: "Is the pain constant?" (e.g., yes, no; constant, intermittent)      Constant with walking , pain eases with laying down  5. RADIATION: "Does the pain shoot into your legs or somewhere else?"     No  6. CAUSE:  "What do you think is causing the back pain?"      Stumbled over branch on Sunday but did not fall 7. BACK OVERUSE:  "Any recent lifting of  heavy objects, strenuous work or exercise?"     na 8. MEDICINES: "What have you taken so far for the pain?" (e.g., nothing, acetaminophen, NSAIDS)     Eases pain but  9. NEUROLOGIC SYMPTOMS: "Do you have any weakness, numbness, or problems with bowel/bladder control?"     na 10. OTHER SYMPTOMS: "Do you have any other symptoms?" (e.g., fever, abdomen pain, burning with urination, blood in urine)       Pain stays in low back  11. PREGNANCY: "Is there any chance you are pregnant?" "When was your last menstrual period?"       na  Protocols used: Back Pain-A-AH

## 2022-12-20 NOTE — Telephone Encounter (Signed)
Patient called, left VM to return the call to the office to speak to the NT.   Summary: back pain   Stumbled over a branch but did not fall, now experiencing back pain. Please advise.

## 2023-02-05 ENCOUNTER — Other Ambulatory Visit: Payer: Self-pay | Admitting: Family Medicine

## 2023-02-05 DIAGNOSIS — I10 Essential (primary) hypertension: Secondary | ICD-10-CM

## 2023-04-23 ENCOUNTER — Other Ambulatory Visit: Payer: Self-pay | Admitting: Family Medicine

## 2023-04-23 DIAGNOSIS — I10 Essential (primary) hypertension: Secondary | ICD-10-CM

## 2023-06-02 IMAGING — MG MM DIGITAL SCREENING BILAT W/ TOMO AND CAD
6 of 12 series · 6 of 36 positions shown · non-contrast
Comparison: Previous exam(s).

CLINICAL DATA: Screening.

EXAM:
DIGITAL SCREENING BILATERAL MAMMOGRAM WITH TOMOSYNTHESIS AND CAD
TECHNIQUE: Bilateral screening digital craniocaudal and mediolateral oblique
mammograms were obtained. Bilateral screening digital breast
tomosynthesis was performed. The images were evaluated with
computer-aided detection.

[L CC synth-2D (1 of 2)]
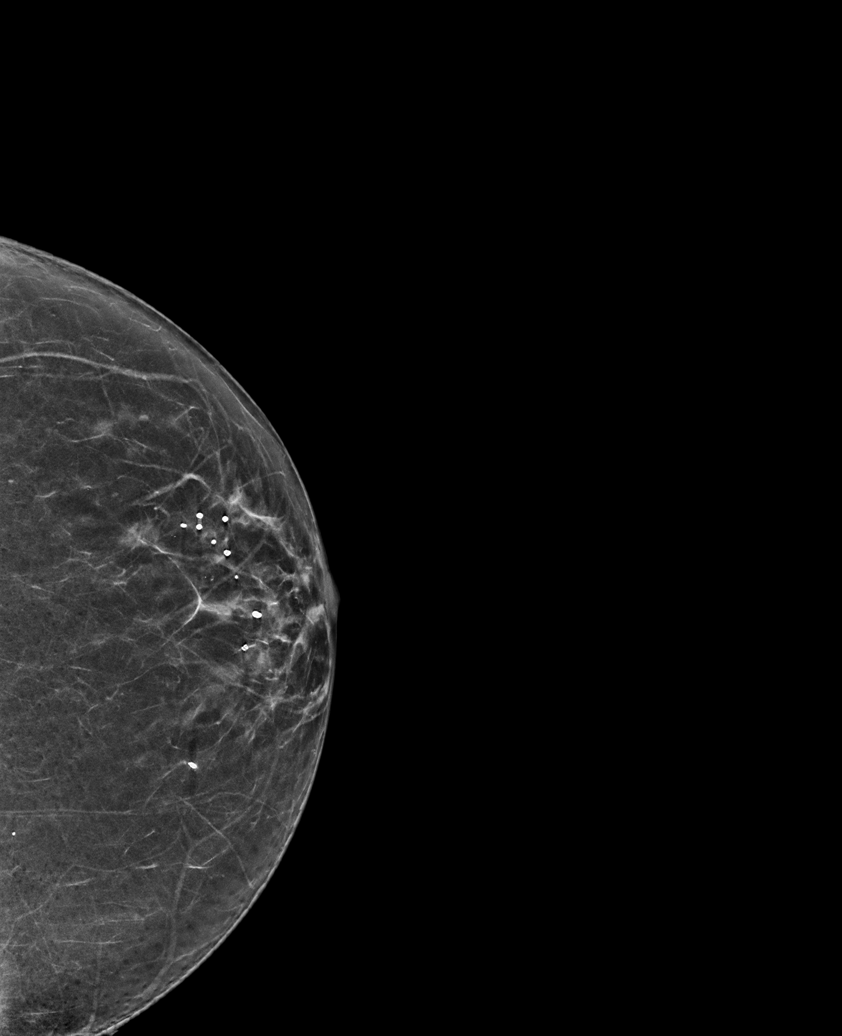

[R MLO synth-2D]
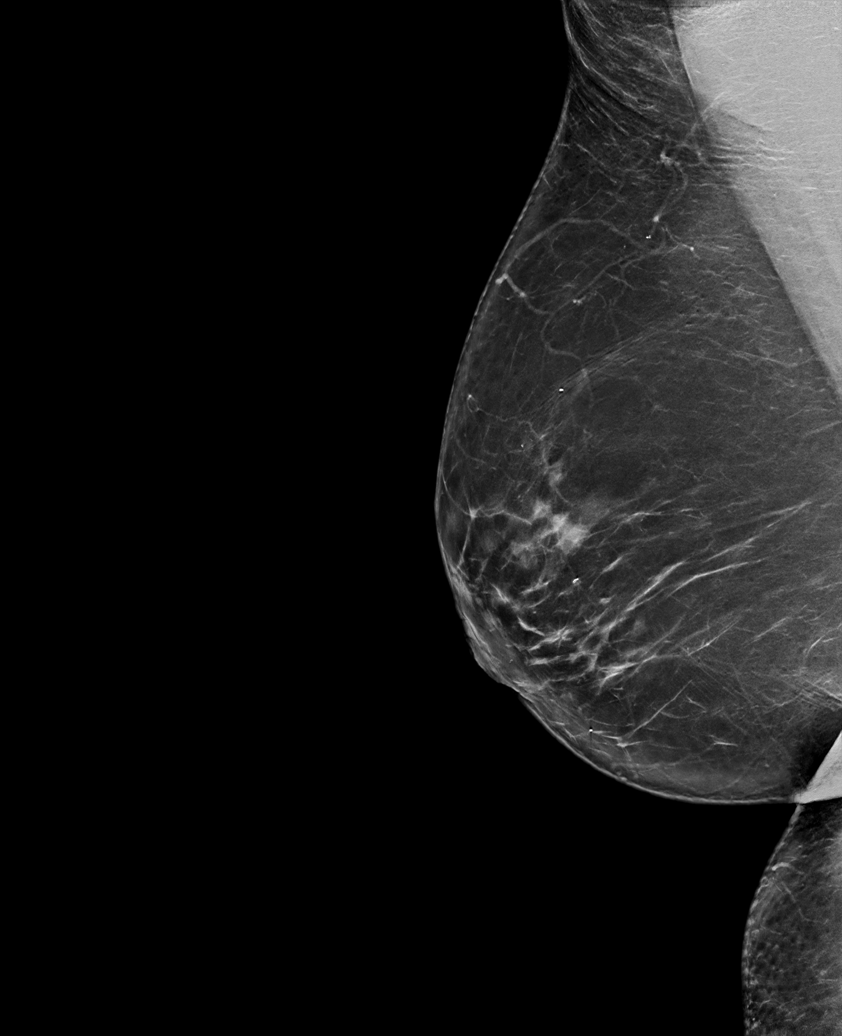

[L MLO synth-2D]
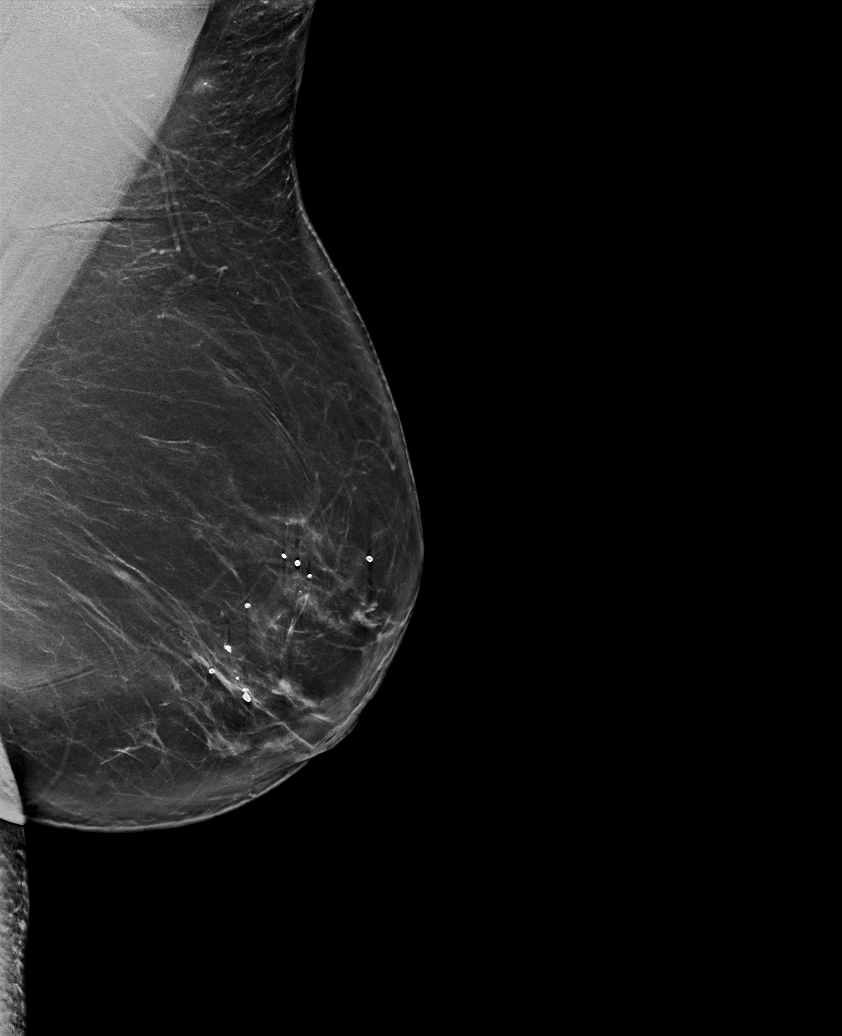

[R CC synth-2D (1 of 2)]
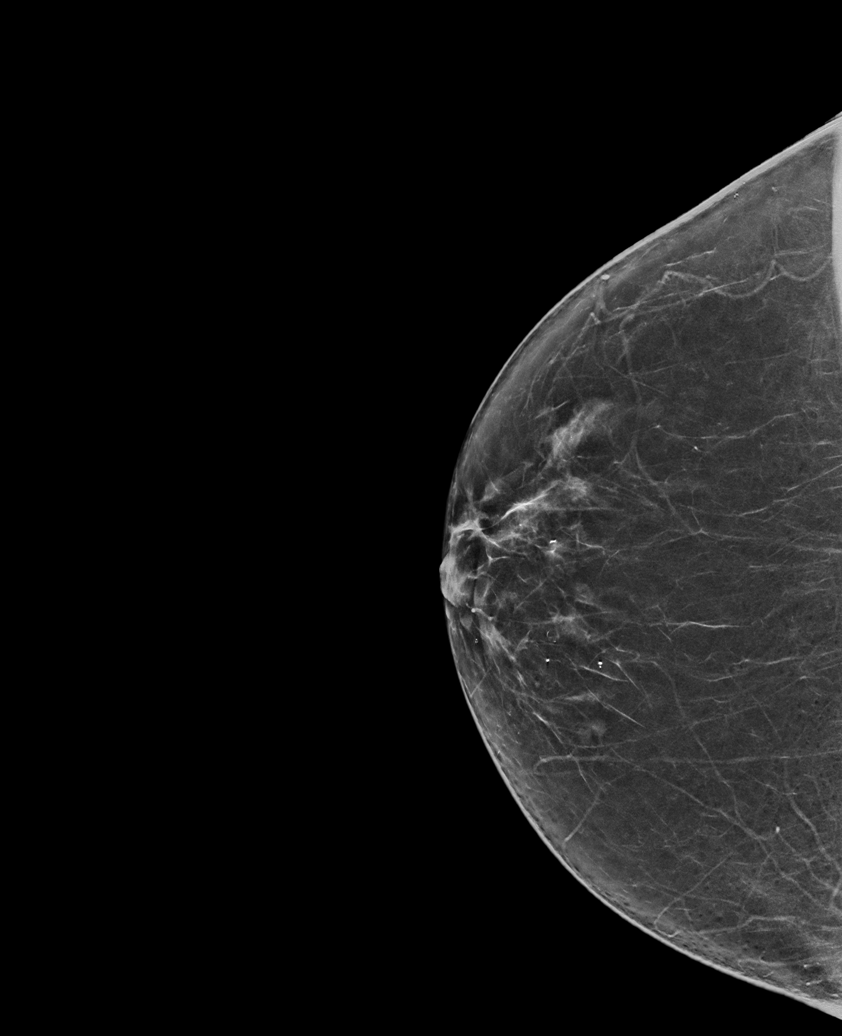

[L CC synth-2D (2 of 2)]
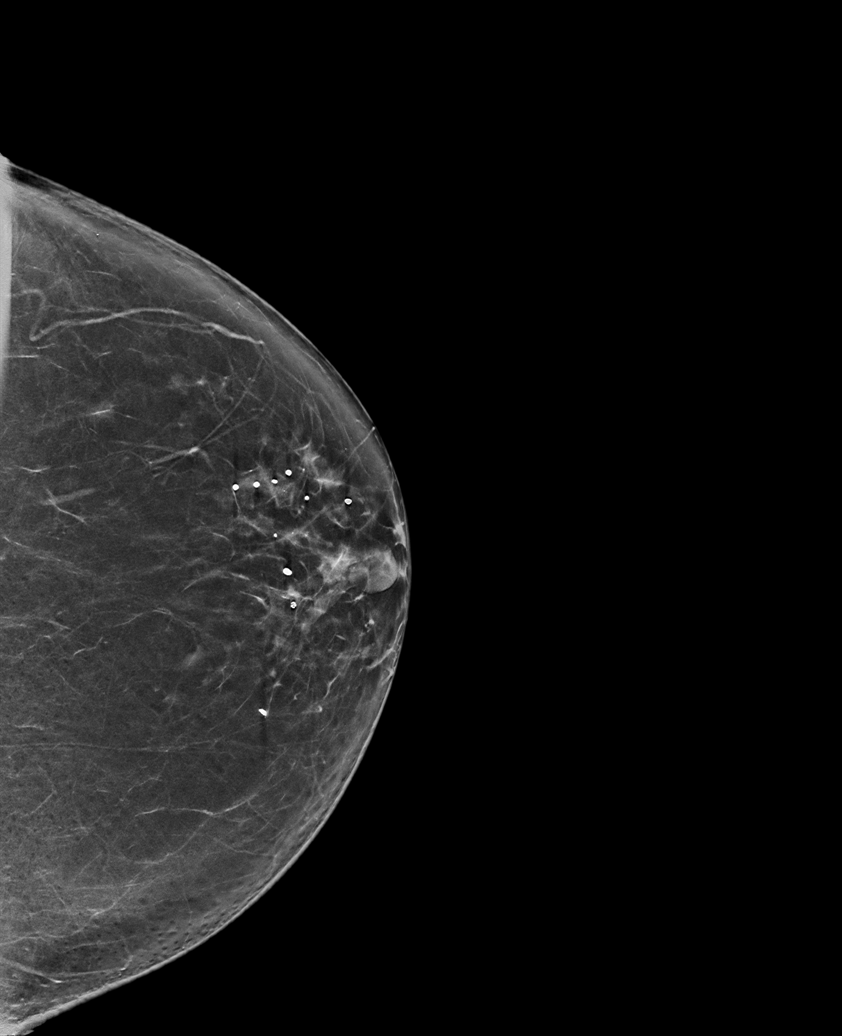

[R CC synth-2D (2 of 2)]
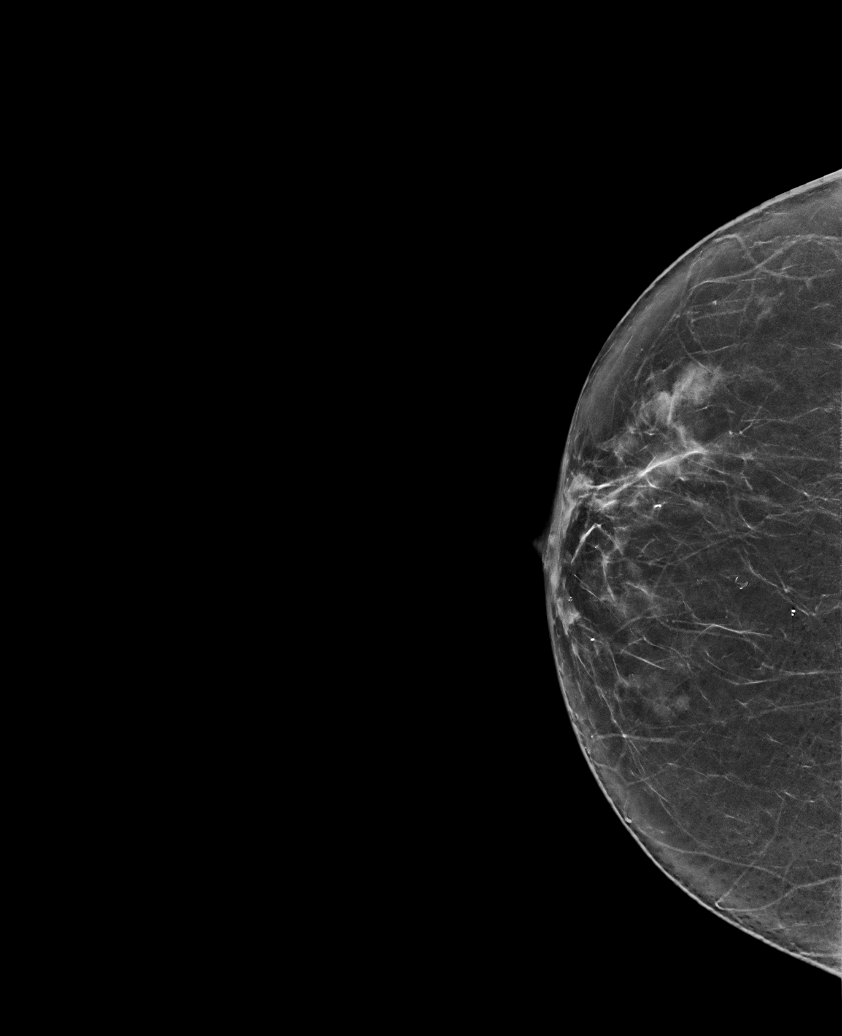

[6 of 36 positions shown; findings below may reference images not displayed]

ACR Breast Density Category b: There are scattered areas of
fibroglandular density.
FINDINGS: There are no findings suspicious for malignancy.
IMPRESSION: No mammographic evidence of malignancy. A result letter of this
screening mammogram will be mailed directly to the patient.

RECOMMENDATION:
Screening mammogram in one year. (Code:51-O-LD2)

BI-RADS CATEGORY  1: Negative.

## 2023-08-08 ENCOUNTER — Encounter: Payer: Self-pay | Admitting: Family Medicine

## 2023-08-08 ENCOUNTER — Ambulatory Visit (INDEPENDENT_AMBULATORY_CARE_PROVIDER_SITE_OTHER): Payer: Medicare Other | Admitting: Family Medicine

## 2023-08-08 VITALS — BP 132/80 | HR 81 | Resp 16 | Ht 69.0 in | Wt 253.0 lb

## 2023-08-08 DIAGNOSIS — Z6837 Body mass index (BMI) 37.0-37.9, adult: Secondary | ICD-10-CM

## 2023-08-08 DIAGNOSIS — R300 Dysuria: Secondary | ICD-10-CM | POA: Diagnosis not present

## 2023-08-08 DIAGNOSIS — E785 Hyperlipidemia, unspecified: Secondary | ICD-10-CM | POA: Diagnosis not present

## 2023-08-08 DIAGNOSIS — I10 Essential (primary) hypertension: Secondary | ICD-10-CM | POA: Diagnosis not present

## 2023-08-08 DIAGNOSIS — Z5181 Encounter for therapeutic drug level monitoring: Secondary | ICD-10-CM | POA: Diagnosis not present

## 2023-08-08 DIAGNOSIS — E66812 Obesity, class 2: Secondary | ICD-10-CM | POA: Insufficient documentation

## 2023-08-08 DIAGNOSIS — Z1231 Encounter for screening mammogram for malignant neoplasm of breast: Secondary | ICD-10-CM

## 2023-08-08 DIAGNOSIS — Z780 Asymptomatic menopausal state: Secondary | ICD-10-CM

## 2023-08-08 LAB — POCT URINALYSIS DIPSTICK
Appearance: NORMAL
Bilirubin, UA: NEGATIVE
Blood, UA: NEGATIVE
Glucose, UA: NEGATIVE
Ketones, UA: NEGATIVE
Leukocytes, UA: NEGATIVE
Nitrite, UA: NEGATIVE
Protein, UA: NEGATIVE
Spec Grav, UA: 1.02 (ref 1.010–1.025)
Urobilinogen, UA: 0.2 U/dL
pH, UA: 6 (ref 5.0–8.0)

## 2023-08-08 MED ORDER — CEPHALEXIN 500 MG PO CAPS: 500.0000 mg | ORAL_CAPSULE | Freq: Two times a day (BID) | ORAL | 0 refills | Status: AC

## 2023-08-08 NOTE — Assessment & Plan Note (Signed)
Stable and well contolled on current meds, which she has taken for years Good compliance, no SE or concerns today BP Readings from Last 3 Encounters:  08/08/23 132/80  04/25/22 124/84  04/18/22 (!) 168/81  On valsartan-hydrochlorothiazide and atenolol, no dose change, continue meds and diet/lifestyle efforts

## 2023-08-08 NOTE — Progress Notes (Signed)
Name: Bethany Bowman   MRN: [id number]    DOB: 04/15/1951   Date:08/08/2023       Progress Note  Chief Complaint  Patient presents with   Medical Management of Chronic Issues     Subjective:   Bethany Bowman is a 73 y.o. female, presents to clinic for routine follow up on chronic conditions  HTN on valsartan-hydrochlorothiazide and atenolol  BP Readings from Last 3 Encounters:  08/08/23 132/80  04/25/22 124/84  04/18/22 (!) 168/81   Pulse Readings from Last 3 Encounters:  08/08/23 81  04/25/22 (!) 57  04/18/22 81    Hyperlipidemia: Currently treated with lipitor 40, pt reports good med compliance Last Lipids: Lab Results  Component Value Date   CHOL 151 02/23/2022   HDL 46 (L) 02/23/2022   LDLCALC 81 02/23/2022   TRIG 142 02/23/2022   CHOLHDL 3.3 02/23/2022   - Denies: Chest pain, shortness of breath, myalgias, claudication  Obesity - weight up only a few lbs compared to last year Wt Readings from Last 5 Encounters:  08/08/23 253 lb (114.8 kg)  12/15/22 249 lb (112.9 kg)  04/25/22 249 lb 14.6 oz (113.4 kg)  04/04/22 259 lb 4.8 oz (117.6 kg)  02/23/22 256 lb 9.6 oz (116.4 kg)   BMI Readings from Last 5 Encounters:  08/08/23 37.36 kg/m  12/15/22 36.77 kg/m  04/25/22 36.91 kg/m  04/04/22 38.29 kg/m  02/23/22 37.89 kg/m   HM reviewed today with pt She reports getting PCV20 at the pharmacy Health Maintenance  Topic Date Due   MAMMOGRAM  07/26/2023   COVID-19 Vaccine (5 - 2024-25 season) 08/24/2023 (Originally 02/18/2023)   INFLUENZA VACCINE  09/17/2023 (Originally 01/18/2023)   Pneumonia Vaccine 41+ Years old (3 of 3 - PPSV23 or PCV20) 08/07/2024 (Originally 04/02/2018)   Medicare Annual Wellness (AWV)  12/15/2023   DTaP/Tdap/Td (3 - Td or Tdap) 05/07/2024   Colonoscopy  04/25/2025   DEXA SCAN  Completed   Hepatitis C Screening  Completed   Zoster Vaccines- Shingrix  Completed   HPV VACCINES  Aged Out    Urinary freq urgency some nocturia and mild  dysuria for a few weeks  No vaginal sx, hx of fibroids, hx of UTI years ago, she took azo and it helped a little but sx returned when she stopped it    Current Outpatient Medications:    aspirin EC 81 MG tablet, Take 1 tablet (81 mg total) by mouth daily., Disp: , Rfl:    atenolol (TENORMIN) 50 MG tablet, TAKE 1 TABLET EVERY DAY, Disp: 90 tablet, Rfl: 3   atorvastatin (LIPITOR) 40 MG tablet, TAKE 1 TABLET EVERY DAY, Disp: 90 tablet, Rfl: 3   cholecalciferol (VITAMIN D) 1000 units tablet, Take 1,000 Units by mouth daily., Disp: , Rfl:    fluticasone (FLONASE) 50 MCG/ACT nasal spray, USE 2 SPRAYS IN EACH NOSTRIL EVERY DAY, Disp: 48 g, Rfl: 3   valsartan-hydrochlorothiazide (DIOVAN-HCT) 320-25 MG tablet, TAKE 1 TABLET EVERY DAY, Disp: 90 tablet, Rfl: 0  Patient Active Problem List   Diagnosis Date Noted   Adenomatous polyp of colon 04/25/2022   Genetic testing 05/11/2021   Family history of colon cancer 04/20/2021   Family history of prostate cancer 04/20/2021   Family history of lung cancer 04/20/2021   History of colonic polyps 04/20/2021   Allergic rhinitis 01/28/2021   Obesity (BMI 35.0-39.9 without comorbidity) 03/15/2017   Hyperlipidemia LDL goal <100 02/18/2016   Insomnia 11/25/2014   Benign essential HTN 07/01/2007  Past Surgical History:  Procedure Laterality Date   COLONOSCOPY WITH PROPOFOL N/A 12/28/2016   Procedure: COLONOSCOPY WITH PROPOFOL;  Surgeon: Wyline Mood, MD;  Location: [facility] END[facility]COPY;  Service: Endoscopy;  Laterality: N/A;   COLONOSCOPY WITH PROPOFOL N/A 01/15/2018   Procedure: COLONOSCOPY WITH PROPOFOL;  Surgeon: Wyline Mood, MD;  Location[facility] ENDOSCOPY;  Service: Gastroenterology;  Laterality: N/A;   COLONOSCOPY WITH PROPOFOL N/A 03/28/2021   Procedure: COLONOSCOPY WITH PROPOFOL;  Surgeon: Wyline Mood, MD;  Location: [facility] ENDOSCOPY;  Service: Gastroenterology;  Laterality: N/A;   COLONOSCOPY WITH PROPOFOL N/A 04/25/2022   Procedure: COLONOSCOPY WITH  PROPOFOL;  Surgeon: Wyline Mood, MD;  Location: [facility] ENDOSCOPY;  Service: Gastroenterology;  Laterality: N/A;    Family History  Problem Relation Age of Onset   Colon cancer Mother 70   Hypertension Mother    Alcohol abuse Father    Hypertension Father    Hypertension Sister    Hypertension Brother    Prostate cancer Brother        dx 91s metastatic   Lung cancer Brother 68   Hypertension Brother    Hypertension Brother    Breast cancer Neg Hx     Social History   Tobacco Use   Smoking status: Former    Current packs/day: 0.00    Average packs/day: 0.5 packs/day for 12.0 years (6.0 ttl pk-yrs)    Types: Cigarettes    Start date: 08/03/1969    Quit date: 08/03/1981    Years since quitting: 42.0   Smokeless tobacco: Never   Tobacco comments:    smoking cessation materials not required  Vaping Use   Vaping status: Never Used  Substance Use Topics   Alcohol use: No    Alcohol/week: 0.0 standard drinks of alcohol   Drug use: No     No Known Allergies    Chart Review Today: I personally reviewed active problem list, medication list, allergies, family history, social history, health maintenance, notes from last encounter, lab results, imaging with the patient/caregiver today.   Review of Systems  Constitutional: Negative.   HENT: Negative.    Eyes: Negative.   Respiratory: Negative.    Cardiovascular: Negative.   Gastrointestinal: Negative.   Endocrine: Negative.   Genitourinary: Negative.   Musculoskeletal: Negative.   Skin: Negative.   Allergic/Immunologic: Negative.   Neurological: Negative.   Hematological: Negative.   Psychiatric/Behavioral: Negative.    All other systems reviewed and are negative.    Objective:   Vitals:   08/08/23 0835  BP: 132/80  Pulse: 81  Resp: 16  SpO2: 99%  Weight: 253 lb (114.8 kg)  Height: 5\' 9"  (1.753 m)    Body mass index is 37.36 kg/m.  Physical Exam Vitals and nursing note reviewed.  Constitutional:       General: She is not in acute distress.    Appearance: Normal appearance. She is well-developed. She is obese. She is not ill-appearing, toxic-appearing or diaphoretic.     Interventions: Face mask in place.  HENT:     Head: Normocephalic and atraumatic.     Right Ear: External ear normal.     Left Ear: External ear normal.  Eyes:     General: Lids are normal. No scleral icterus.       Right eye: No discharge.        Left eye: No discharge.     Conjunctiva/sclera: Conjunctivae normal.  Neck:     Trachea: Phonation normal. No tracheal deviation.  Cardiovascular:  Rate and Rhythm: Normal rate and regular rhythm.     Pulses: Normal pulses.          Radial pulses are 2+ on the right side and 2+ on the left side.       Posterior tibial pulses are 2+ on the right side and 2+ on the left side.     Heart sounds: Normal heart sounds. No murmur heard.    No friction rub. No gallop.  Pulmonary:     Effort: Pulmonary effort is normal. No respiratory distress.     Breath sounds: Normal breath sounds. No stridor. No wheezing, rhonchi or rales.  Chest:     Chest wall: No tenderness.  Abdominal:     General: Bowel sounds are normal. There is no distension.     Palpations: Abdomen is soft.     Tenderness: There is no abdominal tenderness. There is no right CVA tenderness, left CVA tenderness, guarding or rebound.  Musculoskeletal:     Right lower leg: No edema.     Left lower leg: No edema.  Skin:    General: Skin is warm and dry.     Capillary Refill: Capillary refill takes less than 2 seconds.     Coloration: Skin is not jaundiced or pale.     Findings: No rash.  Neurological:     Mental Status: She is alert. Mental status is at baseline.     Motor: No abnormal muscle tone.     Gait: Gait normal.  Psychiatric:        Mood and Affect: Mood normal.        Speech: Speech normal.        Behavior: Behavior normal.      Functional Status Survey:   Results for orders placed or  performed during the hospital encounter of 04/25/22  Surgical pathology   Collection Time: 04/25/22  8:37 AM  Result Value Ref Range   SURGICAL PATHOLOGY      SURGICAL PATHOLOGY CASE: [id number] PATIENT: Paulett Wogan Surgical Pathology Report     Specimen Submitted: A. Colon polyp, sigmoid; cold snare  Clinical History: Personal history of colon polyps and family history. Colon polyp    DIAGNOSIS: A. COLON POLYP, SIGMOID; COLD SNARE: - TUBULAR ADENOMA. - NEGATIVE FOR HIGH-GRADE DYSPLASIA AND MALIGNANCY.  GROSS DESCRIPTION: A. Labeled: Cold snare sigmoid colon polyp Received: Formalin Collection time: 8:37 AM on 04/25/2022 Placed into formalin time: 8:37 AM on 04/25/2022 Tissue fragment(s): 1 Size: 0.4 x 0.3 x 0.1 cm Description: Tan soft tissue fragment Entirely submitted in 1 cassette.  RB 04/25/2022  Final Diagnosis performed by Katherine Mantle, MD.   Electronically signed 04/26/2022 9:16:11AM The electronic signature indicates that the named Attending Pathologist has evaluated the specimen Technical component performed at [facility], [address] [place], [place] [address] Lab: [phone] Dir: Jolene Schimke,  MD, MMM  Professional component performed at [facility], [facility], [address] [place], [place], [place] [address] Lab: [phone] Dir: Beryle Quant, MD       Assessment & Plan:   Benign essential HTN Assessment & Plan: Stable and well contolled on current meds, which she has taken for years Good compliance, no SE or concerns today BP Readings from Last 3 Encounters:  08/08/23 132/80  04/25/22 124/84  04/18/22 (!) 168/81  On valsartan-hydrochlorothiazide and atenolol, no dose change, continue meds and diet/lifestyle efforts  Orders: -     CBC with Differential/Platelet -     COMPLETE METABOLIC PANEL WITH GFR  Hyperlipidemia LDL goal <100 Assessment & Plan: Compliant with meds, no SE, no myalgias, fatigue or jaundice Due for  lipid panel Will see if med dose is still adequate, continue diet and exercise recommendations for HLD  Lab Results  Component Value Date   CHOL 151 02/23/2022   HDL 46 (L) 02/23/2022   LDLCALC 81 02/23/2022   TRIG 142 02/23/2022   CHOLHDL 3.3 02/23/2022   Orders: -     COMPLETE METABOLIC PANEL WITH GFR -     Lipid panel   Class 2 severe obesity with serious comorbidity and body mass index (BMI) of 37.0 to 37.9 in adult, unspecified obesity type (HCC) Assessment & Plan: Only small weight fluctuations, no recent changes Associated comorbidities HTN, HLD Wt Readings from Last 5 Encounters:  08/08/23 253 lb (114.8 kg)  12/15/22 249 lb (112.9 kg)  04/25/22 249 lb 14.6 oz (113.4 kg)  04/04/22 259 lb 4.8 oz (117.6 kg)  02/23/22 256 lb 9.6 oz (116.4 kg)   BMI Readings from Last 5 Encounters:  08/08/23 37.36 kg/m  12/15/22 36.77 kg/m  04/25/22 36.91 kg/m  04/04/22 38.29 kg/m  02/23/22 37.89 kg/m   Orders: -     CBC with Differential/Platelet -     COMPLETE METABOLIC PANEL WITH GFR -     Lipid panel  Encounter for medication monitoring -     CBC with Differential/Platelet -     COMPLETE METABOLIC PANEL WITH GFR -     Lipid panel  Dysuria  Sx for a few weeks, dysuria, frequency and nocturia with odor, dip today unremarkable, she wished to start abx prior to urine culture results, keflex prescribed.  We will follow culture  Abd exam benign - no ttp, no CVA tenderness, pt well appearing Results for orders placed or performed in visit on 08/08/23  POCT Urinalysis Dipstick   Collection Time: 08/08/23  9:22 AM  Result Value Ref Range   Color, UA Yellow    Clarity, UA Clear    Glucose, UA Negative Negative   Bilirubin, UA Negative    Ketones, UA Negative    Spec Grav, UA 1.020 1.010 - 1.025   Blood, UA Negative    pH, UA 6.0 5.0 - 8.0   Protein, UA Negative Negative   Urobilinogen, UA 0.2 0.2 or 1.0 E.U./dL   Nitrite, UA Negative    Leukocytes, UA Negative Negative    Appearance Normal    Odor None   Reviewed dip -     Urine Culture -     POCT urinalysis dipstick  Postmenopausal estrogen deficiency -     DG Bone Density; Future  Breast cancer screening by mammogram -     3D Screening Mammogram, Left and Right; Future    Return in about 6 months (around 02/05/2024) for Routine follow-up.   Danelle Berry, PA-C 08/08/23 8:42 AM

## 2023-08-08 NOTE — Patient Instructions (Signed)
Call this number to set up your Dexa and mammogram [facility] [address], [place], [place] [address] Scheduling phone #: [phone]   Health Maintenance  Topic Date Due   DEXA scan (bone density measurement)  02/17/2021   Mammogram  07/26/2023   COVID-19 Vaccine (5 - 2024-25 season) 08/24/2023*   Flu Shot  09/17/2023*   Pneumonia Vaccine (3 of 3 - PPSV23 or PCV20) 08/07/2024*   Medicare Annual Wellness Visit  12/15/2023   DTaP/Tdap/Td vaccine (3 - Td or Tdap) 05/07/2024   Colon Cancer Screening  04/25/2025   Hepatitis C Screening  Completed   Zoster (Shingles) Vaccine  Completed   HPV Vaccine  Aged Out  *Topic was postponed. The date shown is not the original due date.

## 2023-08-08 NOTE — Assessment & Plan Note (Signed)
Only small weight fluctuations, no recent changes Associated comorbidities HTN, HLD

## 2023-08-08 NOTE — Assessment & Plan Note (Addendum)
Compliant with meds, no SE, no myalgias, fatigue or jaundice Due for lipid panel Will see if med dose is still adequate, continue diet and exercise recommendations for HLD  Lab Results  Component Value Date   CHOL 151 02/23/2022   HDL 46 (L) 02/23/2022   LDLCALC 81 02/23/2022   TRIG 142 02/23/2022   CHOLHDL 3.3 02/23/2022

## 2023-08-09 LAB — CBC WITH DIFFERENTIAL/PLATELET
Absolute Lymphocytes: 2485 {cells}/uL (ref 850–3900)
Absolute Monocytes: 542 {cells}/uL (ref 200–950)
Basophils Absolute: 80 {cells}/uL (ref 0–200)
Basophils Relative: 1.4 %
Eosinophils Absolute: 291 {cells}/uL (ref 15–500)
Eosinophils Relative: 5.1 %
HCT: 41.5 % (ref 35.0–45.0)
Hemoglobin: 13.6 g/dL (ref 11.7–15.5)
MCH: 26.7 pg — ABNORMAL LOW (ref 27.0–33.0)
MCHC: 32.8 g/dL (ref 32.0–36.0)
MCV: 81.4 fL (ref 80.0–100.0)
MPV: 10.1 fL (ref 7.5–12.5)
Monocytes Relative: 9.5 %
Neutro Abs: 2303 {cells}/uL (ref 1500–7800)
Neutrophils Relative %: 40.4 %
Platelets: 250 10*3/uL (ref 140–400)
RBC: 5.1 10*6/uL (ref 3.80–5.10)
RDW: 13.3 % (ref 11.0–15.0)
Total Lymphocyte: 43.6 %
WBC: 5.7 10*3/uL (ref 3.8–10.8)

## 2023-08-09 LAB — COMPLETE METABOLIC PANEL WITH GFR
AG Ratio: 1.4 (calc) (ref 1.0–2.5)
ALT: 23 U/L (ref 6–29)
AST: 21 U/L (ref 10–35)
Albumin: 4.2 g/dL (ref 3.6–5.1)
Alkaline phosphatase (APISO): 62 U/L (ref 37–153)
BUN: 15 mg/dL (ref 7–25)
CO2: 35 mmol/L — ABNORMAL HIGH (ref 20–32)
Calcium: 10.3 mg/dL (ref 8.6–10.4)
Chloride: 99 mmol/L (ref 98–110)
Creat: 0.92 mg/dL (ref 0.60–1.00)
Globulin: 3.1 g/dL (ref 1.9–3.7)
Glucose, Bld: 106 mg/dL — ABNORMAL HIGH (ref 65–99)
Potassium: 3.5 mmol/L (ref 3.5–5.3)
Sodium: 142 mmol/L (ref 135–146)
Total Bilirubin: 0.6 mg/dL (ref 0.2–1.2)
Total Protein: 7.3 g/dL (ref 6.1–8.1)
eGFR: 65 mL/min/{1.73_m2} (ref >=60)

## 2023-08-09 LAB — URINE CULTURE
MICRO NUMBER:: 16104301
Result:: NO GROWTH
SPECIMEN QUALITY:: ADEQUATE

## 2023-08-09 LAB — LIPID PANEL
Cholesterol: 156 mg/dL (ref <200)
HDL: 55 mg/dL (ref >=50)
LDL Cholesterol (Calc): 83 mg/dL
Non-HDL Cholesterol (Calc): 101 mg/dL (ref <130)
Total CHOL/HDL Ratio: 2.8 (calc) (ref <5.0)
Triglycerides: 89 mg/dL (ref <150)

## 2023-08-29 ENCOUNTER — Other Ambulatory Visit: Payer: Self-pay | Admitting: Family Medicine

## 2023-08-29 DIAGNOSIS — I10 Essential (primary) hypertension: Secondary | ICD-10-CM

## 2023-08-29 MED ORDER — VALSARTAN-HYDROCHLOROTHIAZIDE 320-25 MG PO TABS: 1.0000 | ORAL_TABLET | Freq: Every day | ORAL | 1 refills | Status: DC

## 2023-08-29 NOTE — Telephone Encounter (Signed)
 Copied from CRM [id number]. Topic: Clinical - Medication Refill >> Aug 29, 2023  1:42 PM Gildardo Pounds wrote: Most Recent Primary Care Visit:  Provider: Danelle Berry  Department: [facility]  Visit Type: OFFICE VISIT  Date: 08/08/2023  Medication: valsartan-hydrochlorothiazide (DIOVAN-HCT) 320-25 MG tablet  Has the patient contacted their pharmacy? Yes (Agent: If no, request that the patient contact the pharmacy for the refill. If patient does not wish to contact the pharmacy document the reason why and proceed with request.) (Agent: If yes, when and what did the pharmacy advise?)  Is this the correct pharmacy for this prescription? Yes If no, delete pharmacy and type the correct one.  This is the patient's preferred pharmacy:    [id number] - [place], [place] - [address] AT [facility] [address] [place] [address] [place] [address] Phone: [phone] Fax: [phone]   Has the prescription been filled recently? No  Is the patient out of the medication? No  Has the patient been seen for an appointment in the last year OR does the patient have an upcoming appointment? Yes  Can we respond through MyChart? Yes  Agent: Please be advised that Rx refills may take up to 3 business days. We ask that you follow-up with your pharmacy.

## 2023-09-04 ENCOUNTER — Other Ambulatory Visit: Payer: Self-pay | Admitting: Family Medicine

## 2023-09-04 DIAGNOSIS — I10 Essential (primary) hypertension: Secondary | ICD-10-CM

## 2023-09-04 NOTE — Telephone Encounter (Signed)
 Copied from CRM [id number]. Topic: Clinical - Medication Refill >> Sep 04, 2023 10:32 AM Gildardo Pounds wrote: Most Recent Primary Care Visit:  Provider: Danelle Berry  Department: [facility]  Visit Type: OFFICE VISIT  Date: 08/08/2023  Medication: valsartan-hydrochlorothiazide (DIOVAN-HCT) 320-25 MG tablet  Has the patient contacted their pharmacy? Yes (Agent: If no, request that the patient contact the pharmacy for the refill. If patient does not wish to contact the pharmacy document the reason why and proceed with request.) (Agent: If yes, when and what did the pharmacy advise?)  Is this the correct pharmacy for this prescription? Yes If no, delete pharmacy and type the correct one.  This is the patient's preferred pharmacy:  [id number] - [place], [place] - [address] AT [facility] [address] [place] [address] [place] [address] Phone: [phone] Fax: [phone]  Has the prescription been filled recently? Yes  Is the patient out of the medication? Yes  Has the patient been seen for an appointment in the last year OR does the patient have an upcoming appointment? Yes  Can we respond through MyChart? Yes  Agent: Please be advised that Rx refills may take up to 3 business days. We ask that you follow-up with your pharmacy.

## 2023-09-05 MED ORDER — VALSARTAN-HYDROCHLOROTHIAZIDE 320-25 MG PO TABS: 1.0000 | ORAL_TABLET | Freq: Every day | ORAL | 1 refills | Status: DC

## 2023-09-05 NOTE — Telephone Encounter (Signed)
 Change in pharmacy requested  Requested Prescriptions  Pending Prescriptions Disp Refills   valsartan-hydrochlorothiazide (DIOVAN-HCT) 320-25 MG tablet 90 tablet 1    Sig: Take 1 tablet by mouth daily.     Cardiovascular: ARB + Diuretic Combos Failed - 09/05/2023  2:58 PM      Failed - Valid encounter within last 6 months    Recent Outpatient Visits           1 year ago Primary hypertension   [place] [facility] Danelle Berry, PA-C   1 year ago Primary hypertension   [facility] Danelle Berry, PA-C   1 year ago Accelerated hypertension   [place] [facility] Danelle Berry, PA-C   2 years ago Hyperlipidemia LDL goal <100   [place] [facility] Mecum, Oswaldo Conroy, PA-C   2 years ago Benign essential HTN    [facility] Danelle Berry, PA-C       Future Appointments             In 5 months Danelle Berry, PA-C [facility]            Passed - K in normal range and within 180 days    Potassium  Date Value Ref Range Status  08/08/2023 3.5 3.5 - 5.3 mmol/L Final         Passed - Na in normal range and within 180 days    Sodium  Date Value Ref Range Status  08/08/2023 142 135 - 146 mmol/L Final  11/25/2014 143 134 - 144 mmol/L Final         Passed - Cr in normal range and within 180 days    Creat  Date Value Ref Range Status  08/08/2023 0.92 0.60 - 1.00 mg/dL Final   Creatinine, Urine  Date Value Ref Range Status  02/18/2016 167 20 - 320 mg/dL Final         Passed - eGFR is 10 or above and within 180 days    GFR, Est African American  Date Value Ref Range Status  07/13/2020 59 (L) > OR = 60 mL/min/1.86m2 Final   GFR, Est Non African American  Date Value Ref Range Status  07/13/2020 51 (L) > OR = 60 mL/min/1.28m2 Final   eGFR  Date Value Ref Range Status  08/08/2023 65 > OR = 60 mL/min/1.68m2 Final         Passed - Patient is not  pregnant      Passed - Last BP in normal range    BP Readings from Last 1 Encounters:  08/08/23 132/80

## 2023-09-21 ENCOUNTER — Ambulatory Visit
Admission: RE | Admit: 2023-09-21 | Discharge: 2023-09-21 | Disposition: A | Discharge status: Home / Self Care | Source: Ambulatory Visit | Attending: Family Medicine | Admitting: Family Medicine

## 2023-09-21 DIAGNOSIS — Z780 Asymptomatic menopausal state: Secondary | ICD-10-CM | POA: Insufficient documentation

## 2023-09-21 DIAGNOSIS — Z1231 Encounter for screening mammogram for malignant neoplasm of breast: Secondary | ICD-10-CM | POA: Insufficient documentation

## 2023-09-27 ENCOUNTER — Encounter: Payer: Self-pay | Admitting: Family Medicine

## 2023-11-23 ENCOUNTER — Other Ambulatory Visit (HOSPITAL_COMMUNITY)
Admission: RE | Admit: 2023-11-23 | Discharge: 2023-11-23 | Disposition: A | Discharge status: Home / Self Care | Source: Ambulatory Visit | Attending: Family Medicine | Admitting: Family Medicine

## 2023-11-23 ENCOUNTER — Encounter: Payer: Self-pay | Admitting: Family Medicine

## 2023-11-23 ENCOUNTER — Ambulatory Visit (INDEPENDENT_AMBULATORY_CARE_PROVIDER_SITE_OTHER): Admitting: Family Medicine

## 2023-11-23 VITALS — BP 132/82 | HR 76 | Temp 98.2°F | Resp 16 | Ht 69.0 in | Wt 252.0 lb

## 2023-11-23 DIAGNOSIS — R8290 Unspecified abnormal findings in urine: Secondary | ICD-10-CM | POA: Insufficient documentation

## 2023-11-23 DIAGNOSIS — D259 Leiomyoma of uterus, unspecified: Secondary | ICD-10-CM

## 2023-11-23 DIAGNOSIS — Z760 Encounter for issue of repeat prescription: Secondary | ICD-10-CM

## 2023-11-23 DIAGNOSIS — R1900 Intra-abdominal and pelvic swelling, mass and lump, unspecified site: Secondary | ICD-10-CM | POA: Diagnosis not present

## 2023-11-23 DIAGNOSIS — R350 Frequency of micturition: Secondary | ICD-10-CM | POA: Diagnosis not present

## 2023-11-23 DIAGNOSIS — E785 Hyperlipidemia, unspecified: Secondary | ICD-10-CM

## 2023-11-23 DIAGNOSIS — I10 Essential (primary) hypertension: Secondary | ICD-10-CM

## 2023-11-23 LAB — POCT URINALYSIS DIPSTICK
Appearance: NORMAL
Bilirubin, UA: NEGATIVE
Blood, UA: NEGATIVE
Glucose, UA: NEGATIVE
Ketones, UA: NEGATIVE
Leukocytes, UA: NEGATIVE
Nitrite, UA: NEGATIVE
Protein, UA: NEGATIVE
Spec Grav, UA: 1.01 (ref 1.010–1.025)
Urobilinogen, UA: 0.2 U/dL
pH, UA: 6 (ref 5.0–8.0)

## 2023-11-23 MED ORDER — VALSARTAN-HYDROCHLOROTHIAZIDE 320-25 MG PO TABS: 1.0000 | ORAL_TABLET | Freq: Every day | ORAL | 1 refills | Status: DC

## 2023-11-23 MED ORDER — ATENOLOL 50 MG PO TABS: 50.0000 mg | ORAL_TABLET | Freq: Every day | ORAL | 1 refills | Status: DC

## 2023-11-23 MED ORDER — ATORVASTATIN CALCIUM 40 MG PO TABS: 40.0000 mg | ORAL_TABLET | Freq: Every day | ORAL | 1 refills | Status: DC

## 2023-11-23 NOTE — Patient Instructions (Signed)
 Consider changing soaps detergents, airing out at night and use cotton undergarments.  You may want to try a product like Lume on skin folds and see if odor may be from skin and not urine.  We will let you know about the urine and vaginal swab results are.

## 2023-11-23 NOTE — Progress Notes (Signed)
 Patient ID: Bethany Bowman, female    DOB: [birth year], 73 y.o.   MRN: [id number]  PCP: Adeline Hone, PA-C  Chief Complaint  Patient presents with   Lower Stomach Pressure    on/off x1 month. A little bit of pain. No diarrhea    Subjective:   Bethany Bowman is a 73 y.o. female, presents to clinic with CC of the following:  HPI  Urinary odor and frequency and hx of uterine fibroids, she states in the past with uterine fibroids they used to cause some intermittent sx that felt similar to this Discomfort/pressure onset 4 months ago, intermittent Prior urinary sx evaluated and dip unremarkable and culture negative  She inquires about cause of odor if its not from urine or not UTI, she can smell it on herself later in the day usually after urination She denies vaginal pain, discharge, no bowel changes  Patient Active Problem List   Diagnosis Date Noted   Class 2 severe obesity with serious comorbidity and body mass index (BMI) of 37.0 to 37.9 in adult, unspecified obesity type (HCC) 08/08/2023   Adenomatous polyp of colon 04/25/2022   Genetic testing 05/11/2021   Family history of colon cancer 04/20/2021   Family history of prostate cancer 04/20/2021   Family history of lung cancer 04/20/2021   History of colonic polyps 04/20/2021   Allergic rhinitis 01/28/2021   Hyperlipidemia LDL goal <100 02/18/2016   Insomnia 11/25/2014   Benign essential HTN 07/01/2007      Current Outpatient Medications:    aspirin EC 81 MG tablet, Take 1 tablet (81 mg total) by mouth daily., Disp: , Rfl:    atenolol  (TENORMIN ) 50 MG tablet, TAKE 1 TABLET EVERY DAY, Disp: 90 tablet, Rfl: 3   atorvastatin  (LIPITOR) 40 MG tablet, TAKE 1 TABLET EVERY DAY, Disp: 90 tablet, Rfl: 3   cholecalciferol (VITAMIN D ) 1000 units tablet, Take 1,000 Units by mouth daily., Disp: , Rfl:    fluticasone  (FLONASE ) 50 MCG/ACT nasal spray, USE 2 SPRAYS IN EACH NOSTRIL EVERY DAY, Disp: 48 g, Rfl: 3    valsartan -hydrochlorothiazide  (DIOVAN -HCT) 320-25 MG tablet, Take 1 tablet by mouth daily., Disp: 90 tablet, Rfl: 1   No Known Allergies   Social History   Tobacco Use   Smoking status: Former    Current packs/day: 0.00    Average packs/day: 0.5 packs/day for 12.0 years (6.0 ttl pk-yrs)    Types: Cigarettes    Start date: 08/03/1969    Quit date: 08/03/1981    Years since quitting: 42.3   Smokeless tobacco: Never   Tobacco comments:    smoking cessation materials not required  Vaping Use   Vaping status: Never Used  Substance Use Topics   Alcohol use: No    Alcohol/week: 0.0 standard drinks of alcohol   Drug use: No      Chart Review Today: I personally reviewed active problem list, medication list, allergies, family history, social history, health maintenance, notes from last encounter, lab results, imaging with the patient/caregiver today.   Review of Systems  Constitutional: Negative.   HENT: Negative.    Eyes: Negative.   Respiratory: Negative.    Cardiovascular: Negative.   Gastrointestinal: Negative.  Negative for abdominal distention, anal bleeding, constipation, diarrhea, nausea and vomiting.  Endocrine: Negative.   Genitourinary: Negative.  Negative for difficulty urinating, dysuria, flank pain, frequency, genital sores, hematuria, urgency, vaginal bleeding, vaginal discharge and vaginal pain.  Musculoskeletal: Negative.   Skin: Negative.   Allergic/Immunologic: Negative.  Neurological: Negative.   Hematological: Negative.   Psychiatric/Behavioral: Negative.    All other systems reviewed and are negative.      Objective:   Vitals:   11/23/23 1309  BP: 132/82  Pulse: 76  Resp: 16  Temp: 98.2 F (36.8 C)  SpO2: 98%  Weight: 252 lb (114.3 kg)  Height: 5' 9 (1.753 m)    Body mass index is 37.21 kg/m.  Physical Exam Vitals and nursing note reviewed.  Constitutional:      General: She is not in acute distress.    Appearance: Normal appearance.  She is well-developed. She is obese. She is not ill-appearing, toxic-appearing or diaphoretic.  HENT:     Head: Normocephalic and atraumatic.     Right Ear: External ear normal.     Left Ear: External ear normal.     Nose: Nose normal.  Eyes:     General: No scleral icterus.       Right eye: No discharge.        Left eye: No discharge.     Conjunctiva/sclera: Conjunctivae normal.  Neck:     Trachea: No tracheal deviation.  Cardiovascular:     Rate and Rhythm: Normal rate and regular rhythm.     Pulses: Normal pulses.     Heart sounds: Normal heart sounds.  Pulmonary:     Effort: Pulmonary effort is normal. No respiratory distress.     Breath sounds: No stridor.  Abdominal:     General: Bowel sounds are normal. There is no abdominal bruit.     Palpations: Abdomen is soft. There is no mass or pulsatile mass.     Tenderness: There is no abdominal tenderness. There is no right CVA tenderness, left CVA tenderness, guarding or rebound.     Hernia: No hernia is present.  Skin:    General: Skin is warm and dry.     Findings: No rash.  Neurological:     Mental Status: She is alert.     Motor: No abnormal muscle tone.     Coordination: Coordination normal.     Gait: Gait normal.  Psychiatric:        Mood and Affect: Mood normal.        Behavior: Behavior normal.      Results for orders placed or performed in visit on 08/08/23  CBC with Differential/Platelet   Collection Time: 08/08/23  9:17 AM  Result Value Ref Range   WBC 5.7 3.8 - 10.8 Thousand/uL   RBC 5.10 3.80 - 5.10 Million/uL   Hemoglobin 13.6 11.7 - 15.5 g/dL   HCT 16.1 09.6 - 04.5 %   MCV 81.4 80.0 - 100.0 fL   MCH 26.7 (L) 27.0 - 33.0 pg   MCHC 32.8 32.0 - 36.0 g/dL   RDW 40.9 81.1 - 91.4 %   Platelets 250 140 - 400 Thousand/uL   MPV 10.1 7.5 - 12.5 fL   Neutro Abs 2,303 1,500 - 7,800 cells/uL   Absolute Lymphocytes 2,485 850 - 3,900 cells/uL   Absolute Monocytes 542 200 - 950 cells/uL   Eosinophils Absolute  291 15 - 500 cells/uL   Basophils Absolute 80 0 - 200 cells/uL   Neutrophils Relative % 40.4 %   Total Lymphocyte 43.6 %   Monocytes Relative 9.5 %   Eosinophils Relative 5.1 %   Basophils Relative 1.4 %  COMPLETE METABOLIC PANEL WITH GFR   Collection Time: 08/08/23  9:17 AM  Result Value Ref Range   Glucose, Bld  106 (H) 65 - 99 mg/dL   BUN 15 7 - 25 mg/dL   Creat 4.09 8.11 - 9.14 mg/dL   eGFR 65 > OR = 60 NW/GNF/6.21H0   BUN/Creatinine Ratio SEE NOTE: 6 - 22 (calc)   Sodium 142 135 - 146 mmol/L   Potassium 3.5 3.5 - 5.3 mmol/L   Chloride 99 98 - 110 mmol/L   CO2 35 (H) 20 - 32 mmol/L   Calcium  10.3 8.6 - 10.4 mg/dL   Total Protein 7.3 6.1 - 8.1 g/dL   Albumin 4.2 3.6 - 5.1 g/dL   Globulin 3.1 1.9 - 3.7 g/dL (calc)   AG Ratio 1.4 1.0 - 2.5 (calc)   Total Bilirubin 0.6 0.2 - 1.2 mg/dL   Alkaline phosphatase (APISO) 62 37 - 153 U/L   AST 21 10 - 35 U/L   ALT 23 6 - 29 U/L  Lipid panel   Collection Time: 08/08/23  9:17 AM  Result Value Ref Range   Cholesterol 156 <200 mg/dL   HDL 55 > OR = 50 mg/dL   Triglycerides 89 <865 mg/dL   LDL Cholesterol (Calc) 83 mg/dL (calc)   Total CHOL/HDL Ratio 2.8 <5.0 (calc)   Non-HDL Cholesterol (Calc) 101 <130 mg/dL (calc)  POCT Urinalysis Dipstick   Collection Time: 08/08/23  9:22 AM  Result Value Ref Range   Color, UA Yellow    Clarity, UA Clear    Glucose, UA Negative Negative   Bilirubin, UA Negative    Ketones, UA Negative    Spec Grav, UA 1.020 1.010 - 1.025   Blood, UA Negative    pH, UA 6.0 5.0 - 8.0   Protein, UA Negative Negative   Urobilinogen, UA 0.2 0.2 or 1.0 E.U./dL   Nitrite, UA Negative    Leukocytes, UA Negative Negative   Appearance Normal    Odor None   Urine Culture   Collection Time: 08/08/23  9:23 AM   Specimen: Urine  Result Value Ref Range   MICRO NUMBER: [id number]    SPECIMEN QUALITY: Adequate    Sample Source URINE    STATUS: FINAL    Result: No Growth        Assessment & Plan:   1. Urinary  frequency (Primary) Recheck urine and again ensure not UTI - POCT Urinalysis Dipstick - Urine Culture - Ambulatory referral to Urogynecology - Cervicovaginal ancillary only  2. Pelvic fullness With urinary sx and prior gyn hx feel consultwith urogyn would be most helpful to address all sx - Ambulatory referral to Urogynecology  3. Bad odor of urine None noted with urine dip Will do vaginal swab as well as urine culture May also be body odor, discussed other causes of odor and possible approaches (different soaps, airing out evening, body odor sprays she could try) - Ambulatory referral to Urogynecology - Cervicovaginal ancillary only  4. Uterine leiomyoma, unspecified location F/up urogyn - defer imaging to specialist - hx is remote, cannot find last imaging  5.  Med refills:   She needs refill today of a few meds - routine f/up not done today  - atenolol  (TENORMIN ) 50 MG tablet; Take 1 tablet (50 mg total) by mouth daily.  Dispense: 90 tablet; Refill: 1 - atorvastatin  (LIPITOR) 40 MG tablet; Take 1 tablet (40 mg total) by mouth daily.  Dispense: 90 tablet; Refill: 1 - valsartan -hydrochlorothiazide  (DIOVAN -HCT) 320-25 MG tablet; Take 1 tablet by mouth daily.  Dispense: 90 tablet; Refill: 1   we will call pt with results  if anything needs treatment Defer imaging to specialists Abd exam today unremarkable - do not feel any other imaging is indicated Pt to f/up with urogyn for further eval And she should keep her next routine f/up   Adeline Hone, PA-C 11/23/23 1:20 PM

## 2023-11-24 LAB — URINE CULTURE
MICRO NUMBER:: 16549711
Result:: NO GROWTH
SPECIMEN QUALITY:: ADEQUATE

## 2023-11-26 ENCOUNTER — Ambulatory Visit: Payer: Self-pay | Admitting: Family Medicine

## 2023-11-27 LAB — CERVICOVAGINAL ANCILLARY ONLY
Bacterial Vaginitis (gardnerella): NEGATIVE
Candida Glabrata: NEGATIVE
Candida Vaginitis: NEGATIVE
Comment: NEGATIVE
Comment: NEGATIVE
Comment: NEGATIVE

## 2023-12-28 ENCOUNTER — Ambulatory Visit (INDEPENDENT_AMBULATORY_CARE_PROVIDER_SITE_OTHER)

## 2023-12-28 VITALS — BP 132/82 | Ht 69.0 in | Wt 250.0 lb

## 2023-12-28 DIAGNOSIS — Z0000 Encounter for general adult medical examination without abnormal findings: Secondary | ICD-10-CM

## 2023-12-28 DIAGNOSIS — Z2821 Immunization not carried out because of patient refusal: Secondary | ICD-10-CM

## 2023-12-28 NOTE — Patient Instructions (Signed)
 Ms. Bethany Bowman , Thank you for taking time out of your busy schedule to complete your Annual Wellness Visit with me. I enjoyed our conversation and look forward to speaking with you again next year. I, as well as your care team,  appreciate your ongoing commitment to your health goals. Please review the following plan we discussed and let me know if I can assist you in the future. Your Game plan/ To Do List    Referrals: If you haven't heard from the office you've been referred to, please reach out to them at the phone provided.  none Follow up Visits: Next Medicare AWV with our clinical staff: 01/02/2025   Have you seen your provider in the last 6 months (3 months if uncontrolled diabetes)? Yes Next Office Visit with your provider: 02/05/2024  Clinician Recommendations:  Aim for 30 minutes of exercise or brisk walking, 6-8 glasses of water, and 5 servings of fruits and vegetables each day.       This is a list of the screening recommended for you and due dates:  Health Maintenance  Topic Date Due   COVID-19 Vaccine (5 - 2024-25 season) 02/18/2023   Medicare Annual Wellness Visit  12/15/2023   Pneumococcal Vaccine for age over 12 (3 of 3 - PCV20 or PCV21) 08/07/2024*   Flu Shot  01/18/2024   DTaP/Tdap/Td vaccine (3 - Td or Tdap) 05/07/2024   Mammogram  09/20/2024   Colon Cancer Screening  04/25/2025   DEXA scan (bone density measurement)  09/20/2025   Hepatitis C Screening  Completed   Zoster (Shingles) Vaccine  Completed   Hepatitis B Vaccine  Aged Out   HPV Vaccine  Aged Out   Meningitis B Vaccine  Aged Out  *Topic was postponed. The date shown is not the original due date.    Advanced directives: (Declined) Advance directive discussed with you today. Even though you declined this today, please call our office should you change your mind, and we can give you the proper paperwork for you to fill out. Advance Care Planning is important because it:  [x]  Makes sure you receive the  medical care that is consistent with your values, goals, and preferences  [x]  It provides guidance to your family and loved ones and reduces their decisional burden about whether or not they are making the right decisions based on your wishes.  Follow the link provided in your after visit summary or read over the paperwork we have mailed to you to help you started getting your Advance Directives in place. If you need assistance in completing these, please reach out to us  so that we can help you!  See attachments for Preventive Care and Fall Prevention Tips.

## 2023-12-28 NOTE — Progress Notes (Signed)
 Because this visit was a virtual/telehealth visit,  certain criteria was not obtained, such a blood pressure, CBG if applicable, and timed get up and go. Any medications not marked as taking were not mentioned during the medication reconciliation part of the visit. Any vitals not documented were not able to be obtained due to this being a telehealth visit or patient was unable to self-report a recent blood pressure reading due to a lack of equipment at home via telehealth. Vitals that have been documented are verbally provided by the patient.  This visit was performed by a medical professional under my direct supervision. I was immediately available for consultation/collaboration. I have reviewed and agree with the Annual Wellness Visit documentation.  Subjective:   Bethany Bowman is a 73 y.o. who presents for a Medicare Wellness preventive visit.  As a reminder, Annual Wellness Visits don't include a physical exam, and some assessments may be limited, especially if this visit is performed virtually. We may recommend an in-person follow-up visit with your provider if needed.  Visit Complete: Virtual I connected with  Bethany Bowman on 12/28/23 by a audio enabled telemedicine application and verified that I am speaking with the correct person using two identifiers.  Patient Location: Home  Provider Location: Home Office  I discussed the limitations of evaluation and management by telemedicine. The patient expressed understanding and agreed to proceed.  Vital Signs: Because this visit was a virtual/telehealth visit, some criteria may be missing or patient reported. Any vitals not documented were not able to be obtained and vitals that have been documented are patient reported.  VideoDeclined- This patient declined Librarian, academic. Therefore the visit was completed with audio only.  Persons Participating in Visit: Patient.  AWV Questionnaire: Yes: Patient Medicare  AWV questionnaire was completed by the patient on 12/27/2023; I have confirmed that all information answered by patient is correct and no changes since this date.  Cardiac Risk Factors include: advanced age (>80men, >52 women);obesity (BMI >30kg/m2);dyslipidemia;hypertension     Objective:    Today's Vitals   12/28/23 1101  BP: 132/82  Weight: 250 lb (113.4 kg)  Height: 5' 9 (1.753 m)   Body mass index is 36.92 kg/m.     12/28/2023   11:00 AM 12/15/2022   10:54 AM 04/25/2022    7:55 AM 03/28/2021    7:21 AM 02/15/2021   10:29 AM 12/17/2018    3:01 PM 01/15/2018    7:07 AM  Advanced Directives  Does Patient Have a Medical Advance Directive? No No No No No No No   Would patient like information on creating a medical advance directive? No - Patient declined   No - Patient declined Yes (MAU/Ambulatory/Procedural Areas - Information given) No - Patient declined  No - Patient declined      Data saved with a previous flowsheet row definition    Current Medications (verified) Outpatient Encounter Medications as of 12/28/2023  Medication Sig   aspirin EC 81 MG tablet Take 1 tablet (81 mg total) by mouth daily.   atenolol  (TENORMIN ) 50 MG tablet Take 1 tablet (50 mg total) by mouth daily.   atorvastatin  (LIPITOR) 40 MG tablet Take 1 tablet (40 mg total) by mouth daily.   cholecalciferol (VITAMIN D ) 1000 units tablet Take 1,000 Units by mouth daily.   fluticasone  (FLONASE ) 50 MCG/ACT nasal spray USE 2 SPRAYS IN EACH NOSTRIL EVERY DAY   valsartan -hydrochlorothiazide  (DIOVAN -HCT) 320-25 MG tablet Take 1 tablet by mouth daily.  No facility-administered encounter medications on file as of 12/28/2023.    Allergies (verified) Patient has no known allergies.   History: Past Medical History:  Diagnosis Date   Allergy    Anxiety 11/25/2014   Family history of colon cancer    Family history of lung cancer    Family history of prostate cancer    GERD (gastroesophageal reflux disease)     Hyperlipidemia    Hypertension    Personal history of colonic polyps    Past Surgical History:  Procedure Laterality Date   COLONOSCOPY WITH PROPOFOL  N/A 12/28/2016   Procedure: COLONOSCOPY WITH PROPOFOL ;  Surgeon: Therisa Bi, MD;  Location: [facility] ENDOSCOPY;  Service: Endoscopy;  Laterality: N/A;   COLONOSCOPY WITH PROPOFOL  N/A 01/15/2018   Procedure: COLONOSCOPY WITH PROPOFOL ;  Surgeon: Therisa Bi, MD;  Location: [facility] ENDOSCOPY;  Service: Gastroenterology;  Laterality: N/A;   COLONOSCOPY WITH PROPOFOL  N/A 03/28/2021   Procedure: COLONOSCOPY WITH PROPOFOL ;  Surgeon: Therisa Bi, MD;  Location[facility] ENDOSCOPY;  Service: Gastroenterology;  Laterality: N/A;   COLONOSCOPY WITH PROPOFOL  N/A 04/25/2022   Procedure: COLONOSCOPY WITH PROPOFOL ;  Surgeon: Therisa Bi, MD;  Location: [facility] ENDOSCOPY;  Service: Gastroenterology;  Laterality: N/A;   Family History  Problem Relation Age of Onset   Colon cancer Mother 58   Hypertension Mother    Alcohol abuse Father    Hypertension Father    Hypertension Sister    Hypertension Brother    Prostate cancer Brother        dx 20s metastatic   Lung cancer Brother 39   Hypertension Brother    Hypertension Brother    Breast cancer Neg Hx    Social History   Socioeconomic History   Marital status: Married    Spouse name: Network engineer   Number of children: 2   Years of education: some college   Highest education level: Associate degree: occupational, Scientist, product/process development, or vocational program  Occupational History   Not on file  Tobacco Use   Smoking status: Former    Current packs/day: 0.00    Average packs/day: 0.5 packs/day for 12.0 years (6.0 ttl pk-yrs)    Types: Cigarettes    Start date: 08/03/1969    Quit date: 08/03/1981    Years since quitting: 42.4   Smokeless tobacco: Never   Tobacco comments:    smoking cessation materials not required  Vaping Use   Vaping status: Never Used  Substance and Sexual Activity   Alcohol use: No    Alcohol/week: 0.0  standard drinks of alcohol   Drug use: No   Sexual activity: Not Currently  Other Topics Concern   Not on file  Social History Narrative   Not on file   Social Drivers of Health   Financial Resource Strain: Low Risk  (12/27/2023)   Overall Financial Resource Strain (CARDIA)    Difficulty of Paying Living Expenses: Not very hard  Food Insecurity: Food Insecurity Present (12/27/2023)   Hunger Vital Sign    Worried About Running Out of Food in the Last Year: Sometimes true    Ran Out of Food in the Last Year: Never true  Transportation Needs: No Transportation Needs (12/27/2023)   PRAPARE - Administrator, Civil Service (Medical): No    Lack of Transportation (Non-Medical): No  Physical Activity: Insufficiently Active (12/27/2023)   Exercise Vital Sign    Days of Exercise per Week: 5 days    Minutes of Exercise per Session: 20 min  Stress: No Stress Concern  Present (12/27/2023)   [employer] of Occupational Health - Occupational Stress Questionnaire    Feeling of Stress: Only a little  Social Connections: Socially Integrated (12/27/2023)   Social Connection and Isolation Panel    Frequency of Communication with Friends and Family: More than three times a week    Frequency of Social Gatherings with Friends and Family: Once a week    Attends Religious Services: More than 4 times per year    Active Member of [employer] or Organizations: Yes    Attends Engineer, structural: More than 4 times per year    Marital Status: Married    Tobacco Counseling Counseling given: Not Answered Tobacco comments: smoking cessation materials not required    Clinical Intake:  Pre-visit preparation completed: Yes  Pain : No/denies pain     BMI - recorded: 36.92 Nutritional Status: BMI > 30  Obese Nutritional Risks: None Diabetes: No  No results found for: HGBA1C   How often do you need to have someone help you when you read instructions, pamphlets, or other written  materials from your doctor or pharmacy?: 1 - Never  Interpreter Needed?: No  Information entered by :: Anushka Hartinger,CMA   Activities of Daily Living     12/27/2023   10:22 PM  In your present state of health, do you have any difficulty performing the following activities:  Hearing? 0  Vision? 0  Difficulty concentrating or making decisions? 0  Walking or climbing stairs? 0  Dressing or bathing? 0  Doing errands, shopping? 0  Preparing Food and eating ? N  Using the Toilet? N  In the past six months, have you accidently leaked urine? N  Do you have problems with loss of bowel control? N  Managing your Medications? N  Managing your Finances? N  Housekeeping or managing your Housekeeping? N    Patient Care Team: Tapia, Leisa, PA-C as PCP - General (Family Medicine)  I have updated your Care Teams any recent Medical Services you may have received from other providers in the past year.     Assessment:   This is a routine wellness examination for [place].  Hearing/Vision screen Hearing Screening - Comments:: Patient said no difficulties  Vision Screening - Comments:: Patient wears glasses    Goals Addressed             This Visit's Progress    Patient Stated       Patient would like to get healthier        Depression Screen     12/28/2023   11:05 AM 12/15/2022   10:48 AM 08/31/2022    8:59 AM 04/04/2022    1:48 PM 02/23/2022    9:42 AM 02/16/2022   10:22 AM 08/01/2021    7:59 AM  PHQ 2/9 Scores  PHQ - 2 Score 0 0 0 0 0 0 0  PHQ- 9 Score 2  0 0 0 0 0    Fall Risk     12/27/2023   10:22 PM 12/15/2022   10:43 AM 08/31/2022    8:59 AM 04/04/2022    1:48 PM 02/23/2022    9:42 AM  Fall Risk   Falls in the past year? 0 0 0 0 0  Number falls in past yr: 0 0 0 0 0  Injury with Fall? 0 0 0 0 0  Risk for fall due to : No Fall Risks No Fall Risks No Fall Risks No Fall Risks No Fall Risks  Follow  up Falls evaluation completed Education provided;Falls prevention  discussed Falls prevention discussed;Education provided;Falls evaluation completed Falls prevention discussed;Education provided  Falls prevention discussed;Education provided      Data saved with a previous flowsheet row definition    MEDICARE RISK AT HOME:  Medicare Risk at Home Any stairs in or around the home?: (Patient-Rptd) Yes If so, are there any without handrails?: (Patient-Rptd) No Home free of loose throw rugs in walkways, pet beds, electrical cords, etc?: (Patient-Rptd) No Adequate lighting in your home to reduce risk of falls?: (Patient-Rptd) Yes Life alert?: (Patient-Rptd) No Use of a cane, walker or w/c?: (Patient-Rptd) No Grab bars in the bathroom?: (Patient-Rptd) Yes Shower chair or bench in shower?: (Patient-Rptd) No Elevated toilet seat or a handicapped toilet?: (Patient-Rptd) Yes  TIMED UP AND GO:  Was the test performed?  No  Cognitive Function: 6CIT completed        12/28/2023   11:03 AM 12/15/2022   10:58 AM 02/16/2022   10:21 AM 12/17/2018    3:05 PM 12/13/2017    2:17 PM  6CIT Screen  What Year? 0 points 0 points 0 points 0 points 0 points  What month? 0 points 0 points 0 points 0 points 0 points  What time? 0 points 0 points 0 points 0 points 0 points  Count back from 20 0 points 0 points 0 points 0 points 0 points  Months in reverse 0 points 0 points 0 points 0 points 0 points  Repeat phrase 0 points 0 points 0 points 0 points 0 points  Total Score 0 points 0 points 0 points 0 points 0 points    Immunizations Immunization History  Administered Date(s) Administered   Fluad Quad(high Dose 65+) 07/13/2020, 02/23/2022   Influenza Split 01/11/2010   Influenza, High Dose Seasonal PF 02/24/2017, 04/16/2019   Influenza, Seasonal, Injecte, Preservative Fre 12/19/2010, 01/28/2014   Influenza-Unspecified 02/15/2015   Janssen (J&J) SARS-COV-2 Vaccination 08/30/2019   MODERNA COVID-19 SARS-COV-2 PEDS BIVALENT BOOSTER 78yr-64yr 05/19/2020, 11/10/2020,  05/06/2021   Pneumococcal Conjugate-13 05/07/2014   Pneumococcal Polysaccharide-23 04/02/2013   Td 05/07/2014   Tdap 08/26/2012   Zoster Recombinant(Shingrix ) 02/23/2022, 07/04/2022   Zoster, Live 02/12/2012    Screening Tests Health Maintenance  Topic Date Due   COVID-19 Vaccine (5 - 2024-25 season) 02/18/2023   Pneumococcal Vaccine: 50+ Years (3 of 3 - PCV20 or PCV21) 08/07/2024 (Originally 05/08/2019)   INFLUENZA VACCINE  01/18/2024   DTaP/Tdap/Td (3 - Td or Tdap) 05/07/2024   MAMMOGRAM  09/20/2024   Medicare Annual Wellness (AWV)  12/27/2024   Colonoscopy  04/25/2025   DEXA SCAN  09/20/2025   Hepatitis C Screening  Completed   Zoster Vaccines- Shingrix   Completed   Hepatitis B Vaccines  Aged Out   HPV VACCINES  Aged Out   Meningococcal B Vaccine  Aged Out    Health Maintenance  Health Maintenance Due  Topic Date Due   COVID-19 Vaccine (5 - 2024-25 season) 02/18/2023   Health Maintenance Items Addressed:patient is going to give the last covid vaccination  Additional Screening:  Vision Screening: Recommended annual ophthalmology exams for early detection of glaucoma and other disorders of the eye. Would you like a referral to an eye doctor?   No  Dental Screening: Recommended annual dental exams for proper oral hygiene  Community Resource Referral / Chronic Care Management: CRR required this visit?  No   CCM required this visit?  No   Plan:    I have personally reviewed and noted the  following in the patient's chart:   Medical and social history Use of alcohol, tobacco or illicit drugs  Current medications and supplements including opioid prescriptions. Patient is not currently taking opioid prescriptions. Functional ability and status Nutritional status Physical activity Advanced directives List of other physicians Hospitalizations, surgeries, and ER visits in previous 12 months Vitals Screenings to include cognitive, depression, and falls Referrals  and appointments  In addition, I have reviewed and discussed with patient certain preventive protocols, quality metrics, and best practice recommendations. A written personalized care plan for preventive services as well as general preventive health recommendations were provided to patient.   Lyle MARLA Right, [place]   12/28/2023   After Visit Summary: (MyChart) Due to this being a telephonic visit, the after visit summary with patients personalized plan was offered to patient via MyChart   Notes: Please refer to Routing Comments.

## 2024-02-05 ENCOUNTER — Encounter: Payer: Self-pay | Admitting: Family Medicine

## 2024-02-05 ENCOUNTER — Ambulatory Visit (INDEPENDENT_AMBULATORY_CARE_PROVIDER_SITE_OTHER): Payer: Medicare Other | Admitting: Family Medicine

## 2024-02-05 VITALS — BP 132/70 | HR 69 | Resp 16 | Ht 69.0 in | Wt 250.0 lb

## 2024-02-05 DIAGNOSIS — Z6837 Body mass index (BMI) 37.0-37.9, adult: Secondary | ICD-10-CM

## 2024-02-05 DIAGNOSIS — E785 Hyperlipidemia, unspecified: Secondary | ICD-10-CM

## 2024-02-05 DIAGNOSIS — I10 Essential (primary) hypertension: Secondary | ICD-10-CM

## 2024-02-05 DIAGNOSIS — N1831 Chronic kidney disease, stage 3a: Secondary | ICD-10-CM

## 2024-02-05 DIAGNOSIS — E66812 Obesity, class 2: Secondary | ICD-10-CM

## 2024-02-05 DIAGNOSIS — Z760 Encounter for issue of repeat prescription: Secondary | ICD-10-CM

## 2024-02-05 MED ORDER — ATENOLOL 50 MG PO TABS: 50.0000 mg | ORAL_TABLET | Freq: Every day | ORAL | 1 refills | Status: DC

## 2024-02-05 MED ORDER — ATORVASTATIN CALCIUM 40 MG PO TABS: 40.0000 mg | ORAL_TABLET | Freq: Every day | ORAL | 1 refills | Status: DC

## 2024-02-05 MED ORDER — VALSARTAN-HYDROCHLOROTHIAZIDE 320-25 MG PO TABS: 1.0000 | ORAL_TABLET | Freq: Every day | ORAL | 1 refills | Status: DC

## 2024-02-05 NOTE — Assessment & Plan Note (Signed)
 Lab Results  Component Value Date   CHOL 156 08/08/2023   HDL 55 08/08/2023   LDLCALC 83 08/08/2023   TRIG 89 08/08/2023   CHOLHDL 2.8 08/08/2023  Doing well on atorvastatin  40 mg daily no SE or concerns

## 2024-02-05 NOTE — Progress Notes (Signed)
 Name: Bethany Bowman   MRN: [id number]    DOB: 1951-06-13   Date:02/05/2024       Progress Note  Chief Complaint  Patient presents with   Medical Management of Chronic Issues    6 month follow-up   Hypertension   Obesity     Subjective:   Bethany Bowman is a 73 y.o. female, presents to clinic for routine follow up on chronic conditions  HTN well controlled on current medications BP Readings from Last 3 Encounters:  02/05/24 132/70  12/28/23 132/82  11/23/23 132/82    HLD checked last OV and lipids well controlled    Walking and exercising more and lost a few lbs  Wt Readings from Last 5 Encounters:  02/05/24 250 lb (113.4 kg)  12/28/23 250 lb (113.4 kg)  11/23/23 252 lb (114.3 kg)  08/08/23 253 lb (114.8 kg)  12/15/22 249 lb (112.9 kg)   BMI Readings from Last 5 Encounters:  02/05/24 36.92 kg/m  12/28/23 36.92 kg/m  11/23/23 37.21 kg/m  08/08/23 37.36 kg/m  12/15/22 36.77 kg/m       Current Outpatient Medications:    aspirin EC 81 MG tablet, Take 1 tablet (81 mg total) by mouth daily., Disp: , Rfl:    atenolol  (TENORMIN ) 50 MG tablet, Take 1 tablet (50 mg total) by mouth daily., Disp: 90 tablet, Rfl: 1   atorvastatin  (LIPITOR) 40 MG tablet, Take 1 tablet (40 mg total) by mouth daily., Disp: 90 tablet, Rfl: 1   cholecalciferol (VITAMIN D ) 1000 units tablet, Take 1,000 Units by mouth daily., Disp: , Rfl:    fluticasone  (FLONASE ) 50 MCG/ACT nasal spray, USE 2 SPRAYS IN EACH NOSTRIL EVERY DAY, Disp: 48 g, Rfl: 3   valsartan -hydrochlorothiazide  (DIOVAN -HCT) 320-25 MG tablet, Take 1 tablet by mouth daily., Disp: 90 tablet, Rfl: 1  Patient Active Problem List   Diagnosis Date Noted   Class 2 severe obesity with serious comorbidity and body mass index (BMI) of 37.0 to 37.9 in adult, unspecified obesity type (HCC) 08/08/2023   Adenomatous polyp of colon 04/25/2022   Genetic testing 05/11/2021   Family history of colon cancer 04/20/2021   Family history of  prostate cancer 04/20/2021   Family history of lung cancer 04/20/2021   History of colonic polyps 04/20/2021   Allergic rhinitis 01/28/2021   Hyperlipidemia LDL goal <100 02/18/2016   Insomnia 11/25/2014   Benign essential HTN 07/01/2007    Past Surgical History:  Procedure Laterality Date   COLONOSCOPY WITH PROPOFOL  N/A 12/28/2016   Procedure: COLONOSCOPY WITH PROPOFOL ;  Surgeon: Therisa Bi, MD;  Location: [facility] ENDOSCOPY;  Service: Endoscopy;  Laterality: N/A;   COLONOSCOPY WITH PROPOFOL  N/A 01/15/2018   Procedure: COLONOSCOPY WITH PROPOFOL ;  Surgeon: Therisa Bi, MD;  Location: [facility] ENDOSCOPY;  Service: Gastroenterology;  Laterality: N/A;   COLONOSCOPY WITH PROPOFOL  N/A 03/28/2021   Procedure: COLONOSCOPY WITH PROPOFOL ;  Surgeon: Therisa Bi, MD;  Location: [facility] ENDOSCOPY;  Service: Gastroenterology;  Laterality: N/A;   COLONOSCOPY WITH PROPOFOL  N/A 04/25/2022   Procedure: COLONOSCOPY WITH PROPOFOL ;  Surgeon: Therisa Bi, MD;  Location: [facility] ENDOSCOPY;  Service: Gastroenterology;  Laterality: N/A;    Family History  Problem Relation Age of Onset   Colon cancer Mother 5   Hypertension Mother    Alcohol abuse Father    Hypertension Father    Hypertension Sister    Hypertension Brother    Prostate cancer Brother        dx 20s metastatic   Lung cancer  Brother 26   Hypertension Brother    Hypertension Brother    Breast cancer Neg Hx     Social History   Tobacco Use   Smoking status: Former    Current packs/day: 0.00    Average packs/day: 0.5 packs/day for 12.0 years (6.0 ttl pk-yrs)    Types: Cigarettes    Start date: 08/03/1969    Quit date: 08/03/1981    Years since quitting: 42.5   Smokeless tobacco: Never   Tobacco comments:    smoking cessation materials not required  Vaping Use   Vaping status: Never Used  Substance Use Topics   Alcohol use: No    Alcohol/week: 0.0 standard drinks of alcohol   Drug use: No     No Known Allergies  Health Maintenance  Topic  Date Due   COVID-19 Vaccine (5 - 2024-25 season) 02/20/2024 (Originally 02/18/2023)   Pneumococcal Vaccine: 50+ Years (3 of 3 - PCV20 or PCV21) 08/07/2024 (Originally 04/02/2018)   INFLUENZA VACCINE  09/16/2024 (Originally 01/18/2024)   DTaP/Tdap/Td (3 - Td or Tdap) 05/07/2024   MAMMOGRAM  09/20/2024   Medicare Annual Wellness (AWV)  12/27/2024   Colonoscopy  04/25/2025   DEXA SCAN  09/20/2025   Hepatitis C Screening  Completed   Zoster Vaccines- Shingrix   Completed   HPV VACCINES  Aged Out   Meningococcal B Vaccine  Aged Out   Pneumococcal Vaccine  Discontinued    Chart Review Today: I personally reviewed active problem list, medication list, allergies, family history, social history, health maintenance, notes from last encounter, lab results, imaging with the patient/caregiver today.   Review of Systems  Constitutional: Negative.   HENT: Negative.    Eyes: Negative.   Respiratory: Negative.    Cardiovascular: Negative.   Gastrointestinal: Negative.   Endocrine: Negative.   Genitourinary: Negative.   Musculoskeletal: Negative.   Skin: Negative.   Allergic/Immunologic: Negative.   Neurological: Negative.   Hematological: Negative.   Psychiatric/Behavioral: Negative.    All other systems reviewed and are negative.    Objective:   Vitals:   02/05/24 0826  BP: 132/70  Pulse: 69  Resp: 16  SpO2: 98%  Weight: 250 lb (113.4 kg)  Height: 5' 9 (1.753 m)    Body mass index is 36.92 kg/m.  Physical Exam Vitals and nursing note reviewed.  Constitutional:      General: She is not in acute distress.    Appearance: Normal appearance. She is well-developed. She is obese. She is not ill-appearing, toxic-appearing or diaphoretic.     Interventions: Face mask in place.  HENT:     Head: Normocephalic and atraumatic.     Right Ear: External ear normal.     Left Ear: External ear normal.  Eyes:     General: Lids are normal. No scleral icterus.       Right eye: No discharge.         Left eye: No discharge.     Conjunctiva/sclera: Conjunctivae normal.  Neck:     Trachea: Phonation normal. No tracheal deviation.  Cardiovascular:     Rate and Rhythm: Normal rate and regular rhythm.     Pulses: Normal pulses.          Radial pulses are 2+ on the right side and 2+ on the left side.       Posterior tibial pulses are 2+ on the right side and 2+ on the left side.     Heart sounds: Normal heart sounds. No murmur heard.  No friction rub. No gallop.  Pulmonary:     Effort: Pulmonary effort is normal. No respiratory distress.     Breath sounds: Normal breath sounds. No stridor. No wheezing, rhonchi or rales.  Chest:     Chest wall: No tenderness.  Abdominal:     General: Bowel sounds are normal. There is no distension.     Palpations: Abdomen is soft.     Tenderness: There is no abdominal tenderness. There is no right CVA tenderness, left CVA tenderness, guarding or rebound.  Musculoskeletal:     Right lower leg: No edema.     Left lower leg: No edema.  Skin:    General: Skin is warm and dry.     Capillary Refill: Capillary refill takes less than 2 seconds.     Coloration: Skin is not jaundiced or pale.     Findings: No rash.  Neurological:     Mental Status: She is alert. Mental status is at baseline.     Motor: No abnormal muscle tone.     Gait: Gait normal.  Psychiatric:        Mood and Affect: Mood normal.        Speech: Speech normal.        Behavior: Behavior normal.          Assessment & Plan:   Benign essential HTN Assessment & Plan: Stable and well contolled on current meds, which she has taken for years Good compliance, no SE or concerns today BP Readings from Last 3 Encounters:  02/05/24 132/70  12/28/23 132/82  11/23/23 132/82  On valsartan -hydrochlorothiazide  and atenolol , no dose change, continue meds and diet/lifestyle efforts    Class 2 severe obesity with serious comorbidity and body mass index (BMI) of 37.0 to 37.9 in adult,  unspecified obesity type [facility]) Assessment & Plan: Only small weight fluctuations, no recent changes Associated comorbidities HTN, HLD Wt Readings from Last 5 Encounters:  02/05/24 250 lb (113.4 kg)  12/28/23 250 lb (113.4 kg)  11/23/23 252 lb (114.3 kg)  08/08/23 253 lb (114.8 kg)  12/15/22 249 lb (112.9 kg)   BMI Readings from Last 5 Encounters:  02/05/24 36.92 kg/m  12/28/23 36.92 kg/m  11/23/23 37.21 kg/m  08/08/23 37.36 kg/m  12/15/22 36.77 kg/m      Stage 3a chronic kidney disease (HCC) Assessment & Plan: Lab Results  Component Value Date   EGFR 65 08/08/2023   EGFR 57 (L) 02/23/2022   EGFR 61 01/28/2021  Last labs showed improved eGFR    Hyperlipidemia LDL goal <100 Assessment & Plan: Lab Results  Component Value Date   CHOL 156 08/08/2023   HDL 55 08/08/2023   LDLCALC 83 08/08/2023   TRIG 89 08/08/2023   CHOLHDL 2.8 08/08/2023  Doing well on atorvastatin  40 mg daily no SE or concerns    Medication refill -     Atenolol ; Take 1 tablet (50 mg total) by mouth daily.  Dispense: 90 tablet; Refill: 1 -     Atorvastatin  Calcium ; Take 1 tablet (40 mg total) by mouth daily.  Dispense: 90 tablet; Refill: 1 -     Valsartan -hydroCHLOROthiazide ; Take 1 tablet by mouth daily.  Dispense: 90 tablet; Refill: 1     No follow-ups on file.   Michelene Cower, PA-C 02/05/24 8:41 AM

## 2024-02-14 ENCOUNTER — Encounter: Payer: Self-pay | Admitting: Family Medicine

## 2024-02-14 DIAGNOSIS — N1831 Chronic kidney disease, stage 3a: Secondary | ICD-10-CM | POA: Insufficient documentation

## 2024-02-14 NOTE — Assessment & Plan Note (Signed)
 Lab Results  Component Value Date   EGFR 65 08/08/2023   EGFR 57 (L) 02/23/2022   EGFR 61 01/28/2021  Last labs showed improved eGFR

## 2024-02-14 NOTE — Assessment & Plan Note (Signed)
 Only small weight fluctuations, no recent changes Associated comorbidities HTN, HLD Wt Readings from Last 5 Encounters:  02/05/24 250 lb (113.4 kg)  12/28/23 250 lb (113.4 kg)  11/23/23 252 lb (114.3 kg)  08/08/23 253 lb (114.8 kg)  12/15/22 249 lb (112.9 kg)   BMI Readings from Last 5 Encounters:  02/05/24 36.92 kg/m  12/28/23 36.92 kg/m  11/23/23 37.21 kg/m  08/08/23 37.36 kg/m  12/15/22 36.77 kg/m

## 2024-02-14 NOTE — Assessment & Plan Note (Signed)
 Stable and well contolled on current meds, which she has taken for years Good compliance, no SE or concerns today BP Readings from Last 3 Encounters:  02/05/24 132/70  12/28/23 132/82  11/23/23 132/82  On valsartan -hydrochlorothiazide  and atenolol , no dose change, continue meds and diet/lifestyle efforts

## 2024-05-06 ENCOUNTER — Ambulatory Visit: Admitting: Obstetrics

## 2024-06-02 ENCOUNTER — Other Ambulatory Visit: Payer: Self-pay | Admitting: Family Medicine

## 2024-06-02 DIAGNOSIS — Z760 Encounter for issue of repeat prescription: Secondary | ICD-10-CM

## 2024-06-02 NOTE — Telephone Encounter (Unsigned)
 Copied from CRM #[id number]. Topic: Clinical - Medication Refill >> Jun 02, 2024  9:46 AM Montie POUR wrote: Medication:  atorvastatin  (LIPITOR) 40 MG tablet atenolol  (TENORMIN ) 50 MG tablet  Has the patient contacted their pharmacy? Yes (Agent: If no, request that the patient contact the pharmacy for the refill. If patient does not wish to contact the pharmacy document the reason why and proceed with request.) (Agent: If yes, when and what did the pharmacy advise?) Pharmacy needs order to refill  This is the patient's preferred pharmacy:  [id number] [place], [place] - [address] AT [facility] OF [address] [place] [address] [place] [address] Phone: [phone] Fax: [phone]  Is this the correct pharmacy for this prescription? Yes If no, delete pharmacy and type the correct one.   Has the prescription been filled recently? No  Is the patient out of the medication? No  Has the patient been seen for an appointment in the last year OR does the patient have an upcoming appointment? Yes  Can we respond through MyChart? Yes  Agent: Please be advised that Rx refills may take up to 3 business days. We ask that you follow-up with your pharmacy.

## 2024-06-04 NOTE — Telephone Encounter (Signed)
 Both Rx filled 02/05/24 #90 1RF- too soon Requested Prescriptions  Pending Prescriptions Disp Refills   atorvastatin  (LIPITOR) 40 MG tablet 90 tablet 1    Sig: Take 1 tablet (40 mg total) by mouth daily.     Cardiovascular:  Antilipid - Statins Failed - 06/04/2024  2:13 PM      Failed - Lipid Panel in normal range within the last 12 months    Cholesterol, Total  Date Value Ref Range Status  11/25/2014 196 100 - 199 mg/dL Final   Cholesterol  Date Value Ref Range Status  08/08/2023 156 <200 mg/dL Final   LDL Cholesterol (Calc)  Date Value Ref Range Status  08/08/2023 83 mg/dL (calc) Final    Comment:    Reference range: <100 . Desirable range <100 mg/dL for primary prevention;   <70 mg/dL for patients with CHD or diabetic patients  with > or = 2 CHD risk factors. [name] is now calculated using the Martin-Hopkins  calculation, which is a validated novel method providing  better accuracy than the Friedewald equation in the  estimation of LDL-C.  [name] et al. [name] 7986;689(80): 2061-2068  (http://education.[facility].com/faq/FAQ164)    HDL  Date Value Ref Range Status  08/08/2023 55 > OR = 50 mg/dL Final  93/91/7983 50 >60 mg/dL Final    Comment:    According to ATP-III Guidelines, HDL-C >59 mg/dL is considered a negative risk factor for CHD.    Triglycerides  Date Value Ref Range Status  08/08/2023 89 <150 mg/dL Final         Passed - Patient is not pregnant      Passed - Valid encounter within last 12 months    Recent Outpatient Visits           4 months ago Benign essential HTN   [facility]   6 months ago Urinary frequency   [facility]   10 months ago Benign essential HTN   [facility], [facility]       Future Appointments             In 5 days [facility] D[facility], [facility] at [facility], [facility]              atenolol  (TENORMIN ) 50 MG tablet 90 tablet 1    Sig: Take 1 tablet (50 mg total) by mouth daily.     Cardiovascular: Beta Blockers 2 Passed - 06/04/2024  2:13 PM      Passed - Cr in normal range and within 360 days    Creat  Date Value Ref Range Status  08/08/2023 0.92 0.60 - 1.00 mg/dL Final   Creatinine, Urine  Date Value Ref Range Status  02/18/2016 167 20 - 320 mg/dL Final         Passed - Last BP in normal range    BP Readings from Last 1 Encounters:  02/05/24 132/70         Passed - Last Heart Rate in normal range    Pulse Readings from Last 1 Encounters:  02/05/24 69         Passed - Valid encounter within last 6 months    Recent Outpatient Visits           4 months ago Benign essential HTN   [name], [facility]   6 months ago Urinary frequency  [facility], [facility]   10 months ago Benign essential HTN   [facility], [facility]       Future Appointments             In 5 days [facility] Lianne DASEN, [facility] at [name]

## 2024-06-09 ENCOUNTER — Encounter (INDEPENDENT_AMBULATORY_CARE_PROVIDER_SITE_OTHER): Payer: Self-pay

## 2024-06-09 ENCOUNTER — Encounter: Payer: Self-pay | Admitting: Obstetrics

## 2024-06-09 ENCOUNTER — Other Ambulatory Visit (HOSPITAL_COMMUNITY)
Admission: RE | Admit: 2024-06-09 | Discharge: 2024-06-09 | Disposition: A | Discharge status: Home / Self Care | Source: Ambulatory Visit | Attending: Obstetrics | Admitting: Obstetrics

## 2024-06-09 ENCOUNTER — Ambulatory Visit: Payer: Self-pay | Admitting: Obstetrics

## 2024-06-09 ENCOUNTER — Ambulatory Visit (INDEPENDENT_AMBULATORY_CARE_PROVIDER_SITE_OTHER): Admitting: Obstetrics

## 2024-06-09 VITALS — BP 173/83 | HR 56 | Ht 67.0 in | Wt 251.4 lb

## 2024-06-09 DIAGNOSIS — K5909 Other constipation: Secondary | ICD-10-CM | POA: Diagnosis not present

## 2024-06-09 DIAGNOSIS — N3941 Urge incontinence: Secondary | ICD-10-CM

## 2024-06-09 DIAGNOSIS — Z6839 Body mass index (BMI) 39.0-39.9, adult: Secondary | ICD-10-CM

## 2024-06-09 DIAGNOSIS — R351 Nocturia: Secondary | ICD-10-CM | POA: Diagnosis not present

## 2024-06-09 DIAGNOSIS — N952 Postmenopausal atrophic vaginitis: Secondary | ICD-10-CM | POA: Diagnosis present

## 2024-06-09 DIAGNOSIS — R8290 Unspecified abnormal findings in urine: Secondary | ICD-10-CM | POA: Diagnosis not present

## 2024-06-09 DIAGNOSIS — R102 Pelvic and perineal pain unspecified side: Secondary | ICD-10-CM | POA: Insufficient documentation

## 2024-06-09 LAB — POCT URINALYSIS DIPSTICK
Bilirubin, UA: NEGATIVE
Bilirubin, UA: NEGATIVE
Blood, UA: NEGATIVE
Glucose, UA: NEGATIVE
Glucose, UA: NEGATIVE
Ketones, UA: NEGATIVE
Ketones, UA: NEGATIVE
Leukocytes, UA: NEGATIVE
Nitrite, UA: NEGATIVE
Nitrite, UA: NEGATIVE
Protein, UA: NEGATIVE
Protein, UA: NEGATIVE
Spec Grav, UA: 1.02
Spec Grav, UA: 1.015
Urobilinogen, UA: 0.2 U/dL
Urobilinogen, UA: 0.2 U/dL
pH, UA: 6
pH, UA: 5.5

## 2024-06-09 MED ORDER — ESTRADIOL 0.01 % VA CREA: TOPICAL_CREAM | VAGINAL | 3 refills | Status: AC

## 2024-06-09 NOTE — Assessment & Plan Note (Signed)
-   POCT UA and Nuswab negative 11/23/23  - repeat UA + leuk, pending culture and nuswab  - improved since starting probiotics, continue probiotics - reassess after low dose vaginal estrogen

## 2024-06-09 NOTE — Patient Instructions (Addendum)
 Please take your blood pressure medication today  We discussed the symptoms of overactive bladder (OAB), which include urinary urgency, urinary frequency, night-time urination, with or without urge incontinence.  We discussed management including behavioral therapy (decreasing bladder irritants by following a bladder diet, urge suppression strategies, timed voids, bladder retraining), physical therapy, medication; and for refractory cases posterior tibial nerve stimulation, sacral neuromodulation, and intravesical botulinum toxin injection.   For anticholinergic medications, we discussed the potential side effects of anticholinergics including dry eyes, dry mouth, constipation, rare risks of cognitive impairment and urinary retention. You were given prescription for Trospium 20mg  twice a day.  It can take a month to start working so give it time, but if you have bothersome side effects call sooner and we can try a different medication.  Call us  if you  have trouble filling the prescription or if it's not covered by your insurance.  For night time frequency: - avoid fluid intake 3 hours before bedtime - elevated your feet during the day or use compression socks to reduce lower extremity swelling - switch your diuretic (e.g. HCTZ) dosing to 2pm - reports snoring, consider workup for sleep apnea  For vaginal atrophy (thinning of the vaginal tissue that can cause dryness and burning) and UTI prevention we discussed estrogen replacement in the form of vaginal cream.   Start vaginal estrogen therapy nightly for two weeks then 2 times weekly at night. This can be placed with your finger or an applicator inside the vagina and around the urethra.  Please let us  know if the prescription is too expensive and we can look for alternative options.   Is vaginal estrogen therapy safe for me? Vaginal estrogen preparations act on the vaginal skin, and only a very tiny amount is absorbed into the bloodstream  (0.01%).  They work in a similar way to hand or face cream.  There is minimal absorption and they are therefore perfectly safe. If you have had breast cancer and have persistent troublesome symptoms which aren't settling with vaginal moisturisers and lubricants, local estrogen treatment may be a possibility, but consultation with your oncologist should take place first.   Constipation: Our goal is to achieve formed bowel movements daily or every-other-day.  You may need to try different combinations of the following options to find what works best for you - everybody's body works differently so feel free to adjust the dosages as needed.  Some options to help maintain bowel health include:  Dietary changes (more leafy greens, vegetables and fruits; less processed foods) Fiber supplementation (Benefiber, FiberCon, Metamucil or Psyllium). Start slow and increase gradually to full dose. Over-the-counter agents such as: stool softeners (Docusate or Colace) and/or laxatives (Miralax, milk of magnesia)  Power Pudding is a natural mixture that may help your constipation.  To make blend 1 cup applesauce, 1 cup wheat bran, and 3/4 cup prune juice, refrigerate and then take 1 tablespoon daily with a large glass of water as needed.   Women should try to eat at least 21 to 25 grams of fiber a day, while men should aim for 30 to 38 grams a day. You can add fiber to your diet with food or a fiber supplement such as psyllium (metamucil), benefiber, or fibercon.   Here's a look at how much dietary fiber is found in some common foods. When buying packaged foods, check the Nutrition Facts label for fiber content. It can vary among brands.  Fruits Serving size Total fiber (grams)*  Raspberries 1 cup  8.0  Pear 1 medium 5.5  Apple, with skin 1 medium 4.5  Banana 1 medium 3.0  Orange 1 medium 3.0  Strawberries 1 cup 3.0   Vegetables Serving size Total fiber (grams)*  Green peas, boiled 1 cup 9.0  Broccoli, boiled  1 cup chopped 5.0  Turnip greens, boiled 1 cup 5.0  Brussels sprouts, boiled 1 cup 4.0  Potato, with skin, baked 1 medium 4.0  Sweet corn, boiled 1 cup 3.5  Cauliflower, raw 1 cup chopped 2.0  Carrot, raw 1 medium 1.5   Grains Serving size Total fiber (grams)*  Spaghetti, whole-wheat, cooked 1 cup 6.0  Barley, pearled, cooked 1 cup 6.0  Bran flakes 3/4 cup 5.5  Quinoa, cooked 1 cup 5.0  Oat bran muffin 1 medium 5.0  Oatmeal, instant, cooked 1 cup 5.0  Popcorn, air-popped 3 cups 3.5  Brown rice, cooked 1 cup 3.5  Bread, whole-wheat 1 slice 2.0  Bread, rye 1 slice 2.0   Legumes, nuts and seeds Serving size Total fiber (grams)*  Split peas, boiled 1 cup 16.0  Lentils, boiled 1 cup 15.5  Black beans, boiled 1 cup 15.0  Baked beans, canned 1 cup 10.0  Chia seeds 1 ounce 10.0  Almonds 1 ounce (23 nuts) 3.5  Pistachios 1 ounce (49 nuts) 3.0  Sunflower kernels 1 ounce 3.0  *Rounded to nearest 0.5 gram. Source: [employer] for [employer], [employer] [place]

## 2024-06-09 NOTE — Assessment & Plan Note (Signed)
-   For symptomatic vaginal atrophy options include lubrication with a water-based lubricant, personal hygiene measures and barrier protection against wetness, and estrogen replacement in the form of vaginal cream, vaginal tablets, or a time-released vaginal ring.   - Rx to start low dose vaginal estrogen

## 2024-06-09 NOTE — Assessment & Plan Note (Signed)
-   discussed association with pelvic floor disorders - referral to healthy weight and wellness  - encouraged diet modification and increased activity

## 2024-06-09 NOTE — Progress Notes (Signed)
 " New Patient Evaluation and Consultation  Referring Provider: Leavy Mole, PA-C PCP: Leavy Mole, PA-C (Inactive) Date of Service: 06/09/2024  SUBJECTIVE Chief Complaint: New Patient (Initial Visit) (Bethany Bowman is a 73 y.o. female here today for urinary incontinence.)  History of Present Illness: Bethany Bowman is a 73 y.o. Black or African-American female seen in consultation at the request of PA Leavy for evaluation of urinary frequency and pelvic fullness.   Most bothered by lower abdominal pressure and urine odor Malodorous smoky urine noted at night started 6-7 months ago with increased night time urinary frequency with small volume voids Started probiotics with some reduction of odor Denies burning with urination, blood in urine Denies changes in medications, surgery, or diet  Reports history of constipation  Review of records significant for: Fibroids, history of 6 pack years, Stage 3a CKD, HTN  Urinary Symptoms: Leaks urine with with a full bladder, with movement to the bathroom, and with urgency since menopause Leaks 2-3 time(s) per days with urgency with quarter size leakage Denies leakage with coughing/sneezing Denies Pad use Patient is not bothered by UI symptoms.  Day time voids 4-5.  Nocturia: 2 times per night to void with insomnia Reports snoring, denies sleep apnea Drinks until bedtime Denies leg swelling Takes Diovan -hydrochlorothiazide  in the morning Voiding dysfunction:  empties bladder well.  Patient does not use a catheter to empty bladder.  When urinating, patient feels dribbling after finishing Drinks: 16-32oz water per day, 16-32oz Cokes/pepsi, Kool-aid, sprite  UTIs: 0 UTI's in the last year.   Denies history of blood in urine, kidney or bladder stones, pyelonephritis, bladder cancer, and kidney cancer No results found for the last 90 days.   Pelvic Organ Prolapse Symptoms:                  Patient Denies a feeling of a bulge the vaginal  area.   Bowel Symptom: Bowel movements: 1-3 time(s) per week Stool consistency: hard, Type IV stool Straining: yes.  Splinting: no.  Incomplete evacuation: no.  Patient Denies accidental bowel leakage / fecal incontinence Bowel regimen: milk of magnesia laxative 1x/month Last colonoscopy: Results one benign 5mm polyp in sigmoid colon [facility] Colonoscopy          Upcoming     Colonoscopy (Every 3 Years) Next due on 04/25/2025    04/25/2022  COLONOSCOPY  Only the first 1 history entries have been loaded, but more history exists.               Sexual Function Sexually active: no.  Sexual orientation: Straight Pain with sex: No  Pelvic Pain Denies pelvic pain  Past Medical History:  Past Medical History:  Diagnosis Date   Allergy    Anxiety 11/25/2014   Family history of colon cancer    Family history of lung cancer    Family history of prostate cancer    GERD (gastroesophageal reflux disease)    Hyperlipidemia    Hypertension    Personal history of colonic polyps      Past Surgical History:   Past Surgical History:  Procedure Laterality Date   COLONOSCOPY WITH PROPOFOL  N/A 12/28/2016   Procedure: COLONOSCOPY WITH PROPOFOL ;  Surgeon: Therisa Bi, MD;  Location: [facility] ENDOSCOPY;  Service: Endoscopy;  Laterality: N/A;   COLONOSCOPY WITH PROPOFOL  N/A 01/15/2018   Procedure: COLONOSCOPY WITH PROPOFOL ;  Surgeon: Therisa Bi, MD;  Location: [facility] ENDOSCOPY;  Service: Gastroenterology;  Laterality: N/A;   COLONOSCOPY WITH PROPOFOL  N/A 03/28/2021  Procedure: COLONOSCOPY WITH PROPOFOL ;  Surgeon: Therisa Bi, MD;  Location: [facility] ENDOSCOPY;  Service: Gastroenterology;  Laterality: N/A;   COLONOSCOPY WITH PROPOFOL  N/A 04/25/2022   Procedure: COLONOSCOPY WITH PROPOFOL ;  Surgeon: Therisa Bi, MD;  Location: [facility] ENDOSCOPY;  Service: Gastroenterology;  Laterality: N/A;   TUBAL LIGATION       Past OB/GYN History: OB History  Gravida Para Term Preterm AB Living  4 2 2  2 2    SAB IAB Ectopic Multiple Live Births  2    2    # Outcome Date GA Lbr Len/2nd Weight Sex Type Anes PTL Lv  4 Term     F Vag-Vacuum   LIV  3 SAB           2 Term     M Vag-Forceps   LIV  1 SAB             Vaginal deliveries: largest infant 7lb6oz, perineal laceration repaired. Forceps/ Vacuum deliveries: 2, Cesarean section: 0 Menopausal: Yes, at age late 42s, Denies vaginal bleeding since menopause Contraception: BTL. Last pap smear was 2013 normal per documentation.  Any history of abnormal pap smears: no. No results found for: DIAGPAP, HPVHIGH, ADEQPAP  Medications: Patient has a current medication list which includes the following prescription(s): aspirin ec, atenolol , atorvastatin , cholecalciferol, estradiol , fluticasone , and valsartan -hydrochlorothiazide .   Allergies: Patient has no known allergies.   Social History: Social History[1]  Relationship status: married Patient lives with her husband.   Patient is not employed. Regular exercise: No History of abuse: No  Family History:   Family History  Problem Relation Age of Onset   Colon cancer Mother 27   Hypertension Mother    Alcohol abuse Father    Hypertension Father    Hypertension Sister    Hypertension Brother    Prostate cancer Brother        dx 79s metastatic   Lung cancer Brother 63   Hypertension Brother    Hypertension Brother    Breast cancer Neg Hx    Bladder Cancer Neg Hx    Renal cancer Neg Hx      Review of Systems: Review of Systems  Constitutional:  Negative for fever, malaise/fatigue and weight loss.  Respiratory:  Negative for cough, shortness of breath and wheezing.   Cardiovascular:  Negative for chest pain, palpitations and leg swelling.  Gastrointestinal:  Positive for constipation. Negative for abdominal pain and blood in stool.  Genitourinary:  Positive for frequency and urgency. Negative for dysuria and hematuria.       Malodorous urine  Skin:  Negative for rash.   Neurological:  Negative for dizziness, weakness and headaches.  Endo/Heme/Allergies:  Does not bruise/bleed easily.  Psychiatric/Behavioral:  Negative for depression. The patient is not nervous/anxious.      OBJECTIVE Physical Exam: Vitals:   06/09/24 1011 06/09/24 1204  BP: (!) 207/111 (!) 173/83  Pulse: 71 (!) 56  Weight: 251 lb 6.4 oz (114 kg)   Height: 5' 7 (1.702 m)    Physical Exam Constitutional:      General: She is not in acute distress.    Appearance: Normal appearance.  Genitourinary:     Bladder and urethral meatus normal.     No lesions in the vagina.     Genitourinary Comments: Limited bimanual exam due to habitus     Right Labia: No rash, tenderness, lesions, skin changes or Bartholin's cyst.    Left Labia: No tenderness, lesions, skin changes, Bartholin's cyst or rash.  No vaginal discharge, erythema, tenderness, bleeding, ulceration or granulation tissue.     Anterior vaginal prolapse present.    Severe vaginal atrophy present.     Right Adnexa: not tender, not full and no mass present.    Left Adnexa: not tender, not full and no mass present.    No cervical motion tenderness, discharge, friability, lesion, polyp or nabothian cyst.     Uterus is not enlarged, fixed, tender, irregular or prolapsed.     No uterine mass detected.    Urethral meatus caruncle not present.    No urethral prolapse, tenderness, mass, hypermobility, discharge or stress urinary incontinence with cough stress test present.     Bladder is not tender, urgency on palpation not present and masses not present.      Pelvic Floor: Levator muscle strength is 4/5.    Levator ani not tender, obturator internus not tender, no asymmetrical contractions present and no pelvic spasms present.    Symmetrical pelvic sensation, anal wink present and BC reflex present. Cardiovascular:     Rate and Rhythm: Normal rate.  Pulmonary:     Effort: Pulmonary effort is normal. No respiratory distress.   Abdominal:     General: There is no distension.     Palpations: Abdomen is soft. There is no mass.     Tenderness: There is no abdominal tenderness.     Hernia: No hernia is present.   Neurological:     Mental Status: She is alert.  Vitals reviewed. Exam conducted with a chaperone present.      POP-Q:   POP-Q  -2                                            Aa   -2                                           Ba  -7                                              C   2                                            Gh  4                                            Pb  8                                            tvl   -3                                            Ap  -  3                                            Bp  -8                                              D     Post-Void Residual (PVR) by Bladder Scan: In order to evaluate bladder emptying, we discussed obtaining a postvoid residual and patient agreed to this procedure.  Procedure: The ultrasound unit was placed on the patient's abdomen in the suprapubic region after the patient had voided.    Post Void Residual - 06/09/24 1021       Post Void Residual   Post Void Residual 12 mL           Laboratory Results: Lab Results  Component Value Date   COLORU Yellow 06/09/2024   CLARITYU Clear 06/09/2024   GLUCOSEUR Negative 06/09/2024   BILIRUBINUR Negative 06/09/2024   KETONESU Negative 06/09/2024   SPECGRAV 1.015 06/09/2024   RBCUR Negative 06/09/2024   PHUR 5.5 06/09/2024   PROTEINUR Negative 06/09/2024   UROBILINOGEN 0.2 06/09/2024   LEUKOCYTESUR Negative 06/09/2024    Lab Results  Component Value Date   CREATININE 0.92 08/08/2023   CREATININE 1.03 (H) 02/23/2022   CREATININE 0.99 08/01/2021    No results found for: HGBA1C  Lab Results  Component Value Date   HGB 13.6 08/08/2023     ASSESSMENT AND PLAN Ms. Bonaventura is a 73 y.o. with:  1. Urge urinary incontinence   2. Nocturia   3.  Vaginal atrophy   4. Other constipation   5. BMI 39.0-39.9,adult   6. Pelvic pressure in female   7. Bad odor of urine     Urge urinary incontinence Assessment & Plan: - POCT UA + leuk, pending culture. PVR 12mL - We discussed the symptoms of overactive bladder (OAB), which include urinary urgency, urinary frequency, nocturia, with or without urge incontinence.  While we do not know the exact etiology of OAB, several treatment options exist. We discussed management including behavioral therapy (decreasing bladder irritants, urge suppression strategies, timed voids, bladder retraining), physical therapy, medication; for refractory cases posterior tibial nerve stimulation, sacral neuromodulation, and intravesical botulinum toxin injection.  For anticholinergic medications, we discussed the potential side effects of anticholinergics including dry eyes, dry mouth, constipation, cognitive impairment and urinary retention. For Beta-3 agonist medication, we discussed the potential side effect of elevated blood pressure which is more likely to occur in individuals with uncontrolled hypertension. - Rx low dose vaginal estrogen - encouraged Kegel exercises, weight reduction, fluid management and caffeine reduction - declines medications at this time due to minimal bother  Orders: -     POCT urinalysis dipstick -     Estradiol ; Place 0.5g nightly for two weeks then twice a week after  Dispense: 30 g; Refill: 3 -     POCT urinalysis dipstick  Nocturia Assessment & Plan: - avoid fluid intake 3 hours before bedtime - switch diuretic (e.g. valsartan -HCTZ) dosing to 2pm - due to snoring, encouraged to consider sleep study to rule out sleep apnea   Orders: -     Estradiol ; Place 0.5g nightly for two weeks then twice a week after  Dispense: 30 g;  Refill: 3 -     POCT urinalysis dipstick  Vaginal atrophy Assessment & Plan: - For symptomatic vaginal atrophy options include lubrication with a  water-based lubricant, personal hygiene measures and barrier protection against wetness, and estrogen replacement in the form of vaginal cream, vaginal tablets, or a time-released vaginal ring.   - Rx to start low dose vaginal estrogen  Orders: -     Estradiol ; Place 0.5g nightly for two weeks then twice a week after  Dispense: 30 g; Refill: 3 -     Cervicovaginal ancillary only  Other constipation Assessment & Plan: - Type IV stool 1-3x/week - For constipation, we reviewed the importance of a better bowel regimen.  We also discussed the importance of avoiding chronic straining, as it can exacerbate her pelvic floor symptoms; we discussed treating constipation and straining prior to surgery, as postoperative straining can lead to damage to the repair and recurrence of symptoms. We discussed initiating therapy with increasing fluid intake, fiber supplementation, stool softeners, and laxatives such as miralax.  - encouraged squatting position to minimize straining    BMI 39.0-39.9,adult Assessment & Plan: - discussed association with pelvic floor disorders - referral to healthy weight and wellness  - encouraged diet modification and increased activity   Orders: -     Amb Ref to Medical Weight Management  Pelvic pressure in female Assessment & Plan: - history of fibroids - minimal stage I pelvic organ prolapse on exam - transvaginal ultrasound ordered to assess uterus due to history of fibroids  Orders: -     US  PELVIS TRANSVAGINAL NON-OB (TV ONLY); Future  Bad odor of urine Assessment & Plan: - POCT UA and Nuswab negative 11/23/23  - repeat UA + leuk, pending culture and nuswab  - improved since starting probiotics, continue probiotics - reassess after low dose vaginal estrogen   Time spent: I spent 60 minutes dedicated to the care of this patient on the date of this encounter to include pre-visit review of records, face-to-face time with the patient discussing urgency urinary  incontinence, vaginal atrophy, BMI, pelvic pressure, malodorous urine, constipation, nocturia, and post visit documentation and ordering medication/ testing.   Lianne ONEIDA Gillis, MD        [1]  Social History Tobacco Use   Smoking status: Former    Current packs/day: 0.00    Average packs/day: 0.5 packs/day for 12.0 years (6.0 ttl pk-yrs)    Types: Cigarettes    Start date: 08/03/1969    Quit date: 08/03/1981    Years since quitting: 42.8   Smokeless tobacco: Never   Tobacco comments:    smoking cessation materials not required  Vaping Use   Vaping status: Never Used  Substance Use Topics   Alcohol use: No    Alcohol/week: 0.0 standard drinks of alcohol   Drug use: No   "

## 2024-06-09 NOTE — Assessment & Plan Note (Signed)
-   history of fibroids - minimal stage I pelvic organ prolapse on exam - transvaginal ultrasound ordered to assess uterus due to history of fibroids

## 2024-06-09 NOTE — Assessment & Plan Note (Signed)
-   avoid fluid intake 3 hours before bedtime - switch diuretic (e.g. valsartan -HCTZ) dosing to 2pm - due to snoring, encouraged to consider sleep study to rule out sleep apnea

## 2024-06-09 NOTE — Assessment & Plan Note (Signed)
-   Type IV stool 1-3x/week - For constipation, we reviewed the importance of a better bowel regimen.  We also discussed the importance of avoiding chronic straining, as it can exacerbate her pelvic floor symptoms; we discussed treating constipation and straining prior to surgery, as postoperative straining can lead to damage to the repair and recurrence of symptoms. We discussed initiating therapy with increasing fluid intake, fiber supplementation, stool softeners, and laxatives such as miralax.  - encouraged squatting position to minimize straining

## 2024-06-09 NOTE — Assessment & Plan Note (Signed)
-   POCT UA + leuk, pending culture. PVR 12mL - We discussed the symptoms of overactive bladder (OAB), which include urinary urgency, urinary frequency, nocturia, with or without urge incontinence.  While we do not know the exact etiology of OAB, several treatment options exist. We discussed management including behavioral therapy (decreasing bladder irritants, urge suppression strategies, timed voids, bladder retraining), physical therapy, medication; for refractory cases posterior tibial nerve stimulation, sacral neuromodulation, and intravesical botulinum toxin injection.  For anticholinergic medications, we discussed the potential side effects of anticholinergics including dry eyes, dry mouth, constipation, cognitive impairment and urinary retention. For Beta-3 agonist medication, we discussed the potential side effect of elevated blood pressure which is more likely to occur in individuals with uncontrolled hypertension. - Rx low dose vaginal estrogen - encouraged Kegel exercises, weight reduction, fluid management and caffeine reduction - declines medications at this time due to minimal bother

## 2024-06-10 LAB — CERVICOVAGINAL ANCILLARY ONLY
Bacterial Vaginitis (gardnerella): NEGATIVE
Candida Glabrata: NEGATIVE
Candida Vaginitis: NEGATIVE
Comment: NEGATIVE
Comment: NEGATIVE
Comment: NEGATIVE

## 2024-06-25 ENCOUNTER — Other Ambulatory Visit: Payer: Self-pay | Admitting: Obstetrics

## 2024-06-25 ENCOUNTER — Ambulatory Visit
Admission: RE | Admit: 2024-06-25 | Discharge: 2024-06-25 | Disposition: A | Discharge status: Home / Self Care | Source: Ambulatory Visit | Attending: Obstetrics | Admitting: Obstetrics

## 2024-06-25 DIAGNOSIS — R351 Nocturia: Secondary | ICD-10-CM

## 2024-06-25 DIAGNOSIS — N3941 Urge incontinence: Secondary | ICD-10-CM

## 2024-06-25 DIAGNOSIS — R102 Pelvic and perineal pain unspecified side: Secondary | ICD-10-CM | POA: Insufficient documentation

## 2024-06-25 DIAGNOSIS — Z6839 Body mass index (BMI) 39.0-39.9, adult: Secondary | ICD-10-CM

## 2024-06-25 DIAGNOSIS — K5909 Other constipation: Secondary | ICD-10-CM

## 2024-06-25 DIAGNOSIS — R8290 Unspecified abnormal findings in urine: Secondary | ICD-10-CM

## 2024-06-25 DIAGNOSIS — N952 Postmenopausal atrophic vaginitis: Secondary | ICD-10-CM

## 2024-07-03 ENCOUNTER — Ambulatory Visit: Payer: Self-pay | Admitting: Obstetrics

## 2024-07-10 ENCOUNTER — Ambulatory Visit: Admitting: Family Medicine

## 2024-07-10 ENCOUNTER — Encounter: Admitting: Family Medicine

## 2024-07-10 ENCOUNTER — Encounter: Payer: Self-pay | Admitting: Family Medicine

## 2024-07-10 VITALS — BP 124/82 | HR 71 | Resp 16 | Ht 67.0 in | Wt 250.0 lb

## 2024-07-10 DIAGNOSIS — Z23 Encounter for immunization: Secondary | ICD-10-CM | POA: Diagnosis not present

## 2024-07-10 DIAGNOSIS — Z0000 Encounter for general adult medical examination without abnormal findings: Secondary | ICD-10-CM | POA: Diagnosis not present

## 2024-07-10 DIAGNOSIS — I10 Essential (primary) hypertension: Secondary | ICD-10-CM

## 2024-07-10 DIAGNOSIS — E785 Hyperlipidemia, unspecified: Secondary | ICD-10-CM

## 2024-07-10 DIAGNOSIS — R739 Hyperglycemia, unspecified: Secondary | ICD-10-CM

## 2024-07-10 MED ORDER — ATORVASTATIN CALCIUM 40 MG PO TABS: 40.0000 mg | ORAL_TABLET | Freq: Every day | ORAL | 1 refills | Status: AC

## 2024-07-10 MED ORDER — ATENOLOL 50 MG PO TABS: 50.0000 mg | ORAL_TABLET | Freq: Every day | ORAL | 1 refills | Status: AC

## 2024-07-10 MED ORDER — VALSARTAN-HYDROCHLOROTHIAZIDE 320-25 MG PO TABS: 1.0000 | ORAL_TABLET | Freq: Every day | ORAL | 1 refills | Status: AC

## 2024-07-10 NOTE — Assessment & Plan Note (Addendum)
 HTN controlled ; BP is 124/82 at today's visit    -Continue Atenolol  50mg  daily, refill provided -Continue Valsartan -hydrochlorothiazide  320-25mg  daily, refill provided Orders:   atenolol  (TENORMIN ) 50 MG tablet; Take 1 tablet (50 mg total) by mouth daily.   valsartan -hydrochlorothiazide  (DIOVAN -HCT) 320-25 MG tablet; Take 1 tablet by mouth daily.   CBC w/Diff/Platelet   Comprehensive Metabolic Panel (CMET)

## 2024-07-10 NOTE — Assessment & Plan Note (Addendum)
 BMI 39.16 with chronic conditions as documented above.   -Continue with dietary changes and exercise as tolerated

## 2024-07-10 NOTE — Progress Notes (Signed)
 "  Established Patient Office Visit  Subjective   Patient ID: Bethany Bowman, female    DOB: [birth year]  Age: 74 y.o. MRN: [id number]  Chief Complaint  Patient presents with   Transitions Of Care    HPI Bethany Bowman is a pleasant 74 year old female who presents today for transition of care visit and medication refills. She does request her flu vaccine today.   She has been seeing a GYN in [place] starting last year. She reports she has fibroid tumors and GYN sent her for an ultrasound a couple of weeks ago, and it was determined that she still had fibroids. She voices she has follow up with GYN next Thursday. Advised to maintain follow up with GYN.    Review of Systems  Respiratory:  Negative for shortness of breath.   Cardiovascular:  Negative for chest pain and palpitations.  Musculoskeletal:  Negative for falls.  Neurological:  Negative for dizziness and headaches.  Psychiatric/Behavioral:  Negative for depression. The patient is not nervous/anxious and does not have insomnia.       Objective:     BP 124/82   Pulse 71   Resp 16   Ht 5' 7 (1.702 m)   Wt 250 lb (113.4 kg)   LMP  (LMP Unknown)   SpO2 99%   BMI 39.16 kg/m  BP Readings from Last 3 Encounters:  07/10/24 124/82  06/09/24 (!) 173/83  02/05/24 132/70   Wt Readings from Last 3 Encounters:  07/10/24 250 lb (113.4 kg)  06/09/24 251 lb 6.4 oz (114 kg)  02/05/24 250 lb (113.4 kg)   SpO2 Readings from Last 3 Encounters:  07/10/24 99%  02/05/24 98%  11/23/23 98%      Physical Exam Constitutional:      Appearance: She is obese.  Cardiovascular:     Rate and Rhythm: Normal rate and regular rhythm.     Heart sounds: Normal heart sounds.  Pulmonary:     Effort: Pulmonary effort is normal.     Breath sounds: Normal breath sounds.  Skin:    General: Skin is warm and dry.  Neurological:     General: No focal deficit present.     Mental Status: She is alert.  Psychiatric:        Mood and Affect: Mood  normal.        Behavior: Behavior normal.      Last CBC Lab Results  Component Value Date   WBC 5.7 08/08/2023   HGB 13.6 08/08/2023   HCT 41.5 08/08/2023   MCV 81.4 08/08/2023   MCH 26.7 (L) 08/08/2023   RDW 13.3 08/08/2023   PLT 250 08/08/2023   Last metabolic panel Lab Results  Component Value Date   GLUCOSE 106 (H) 08/08/2023   NA 142 08/08/2023   K 3.5 08/08/2023   CL 99 08/08/2023   CO2 35 (H) 08/08/2023   BUN 15 08/08/2023   CREATININE 0.92 08/08/2023   EGFR 65 08/08/2023   CALCIUM  10.3 08/08/2023   PHOS 2.5 02/18/2016   PROT 7.3 08/08/2023   ALBUMIN 4.0 08/24/2016   LABGLOB 3.0 11/25/2014   AGRATIO 1.5 11/25/2014   BILITOT 0.6 08/08/2023   ALKPHOS 46 08/24/2016   AST 21 08/08/2023   ALT 23 08/08/2023   Last lipids Lab Results  Component Value Date   CHOL 156 08/08/2023   HDL 55 08/08/2023   LDLCALC 83 08/08/2023   TRIG 89 08/08/2023   CHOLHDL 2.8 08/08/2023  Assessment & Plan:   Assessment & Plan Benign essential HTN HTN controlled ; BP is 124/82 at today's visit    -Continue Atenolol  50mg  daily, refill provided -Continue Valsartan -hydrochlorothiazide  320-25mg  daily, refill provided Orders:   atenolol  (TENORMIN ) 50 MG tablet; Take 1 tablet (50 mg total) by mouth daily.   valsartan -hydrochlorothiazide  (DIOVAN -HCT) 320-25 MG tablet; Take 1 tablet by mouth daily.   CBC w/Diff/Platelet   Comprehensive Metabolic Panel (CMET)  Hyperlipidemia, unspecified hyperlipidemia type Hyperlipidemia controlled, last LDL 83 in 07/2023. Managed with statin therapy.  -Update labs  -Continue Atorvastatin  40mg  daily, refill provided  Orders:   atorvastatin  (LIPITOR) 40 MG tablet; Take 1 tablet (40 mg total) by mouth daily.   Comprehensive Metabolic Panel (CMET)   Lipid Profile  Morbid (severe) obesity due to excess calories (HCC) BMI 39.16 with chronic conditions as documented above.   -Continue with dietary changes and exercise as tolerated     Healthcare maintenance -Flu vaccine administered today.  -CRC screening UTD at this time. Completed 04/2022, recommended 3 year follow up. Due 04/2025 -Mammogram UTD at this time. Completed 09/21/23 and negative, recommended 1 year follow up (09/2024) -DEXA UTD, completed 09/21/23 -AWV scheduled for 01/02/25    Hyperglycemia Mild hyperglycemia seen with last chemistry panel. Will add on A1c to screen for prediabetes/ diabetes. She is agreeable.  Orders:   Comprehensive Metabolic Panel (CMET)   HgB A1c  Immunization due -Flu vaccine due, flu vaccine administered today.  Orders:   Flu vaccine HIGH DOSE PF(Fluzone Trivalent)      Return in about 3 months (around 10/08/2024) for chronic condition follow up .    Bethany LOISE CORE, FNP "

## 2024-07-11 ENCOUNTER — Ambulatory Visit: Payer: Self-pay | Admitting: Family Medicine

## 2024-07-11 LAB — CBC WITH DIFFERENTIAL/PLATELET
Absolute Lymphocytes: 2182 {cells}/uL (ref 850–3900)
Absolute Monocytes: 486 {cells}/uL (ref 200–950)
Basophils Absolute: 49 {cells}/uL (ref 0–200)
Basophils Relative: 0.9 %
Eosinophils Absolute: 238 {cells}/uL (ref 15–500)
Eosinophils Relative: 4.4 %
HCT: 40.9 % (ref 35.9–46.0)
Hemoglobin: 13.3 g/dL (ref 11.7–15.5)
MCH: 27 pg (ref 27.0–33.0)
MCHC: 32.5 g/dL (ref 31.6–35.4)
MCV: 83.1 fL (ref 81.4–101.7)
MPV: 9.8 fL (ref 7.5–12.5)
Monocytes Relative: 9 %
Neutro Abs: 2446 {cells}/uL (ref 1500–7800)
Neutrophils Relative %: 45.3 %
Platelets: 239 Thousand/uL (ref 140–400)
RBC: 4.92 Million/uL (ref 3.80–5.10)
RDW: 13.6 % (ref 11.0–15.0)
Total Lymphocyte: 40.4 %
WBC: 5.4 Thousand/uL (ref 3.8–10.8)

## 2024-07-11 LAB — COMPREHENSIVE METABOLIC PANEL WITH GFR
AG Ratio: 1.6 (calc) (ref 1.0–2.5)
ALT: 21 U/L (ref 6–29)
AST: 19 U/L (ref 10–35)
Albumin: 4.2 g/dL (ref 3.6–5.1)
Alkaline phosphatase (APISO): 49 U/L (ref 37–153)
BUN: 12 mg/dL (ref 7–25)
CO2: 33 mmol/L — ABNORMAL HIGH (ref 20–32)
Calcium: 10.1 mg/dL (ref 8.6–10.4)
Chloride: 102 mmol/L (ref 98–110)
Creat: 0.87 mg/dL (ref 0.60–1.00)
Globulin: 2.6 g/dL (ref 1.9–3.7)
Glucose, Bld: 111 mg/dL — ABNORMAL HIGH (ref 65–99)
Potassium: 3.6 mmol/L (ref 3.5–5.3)
Sodium: 141 mmol/L (ref 135–146)
Total Bilirubin: 0.6 mg/dL (ref 0.2–1.2)
Total Protein: 6.8 g/dL (ref 6.1–8.1)
eGFR: 69 mL/min/1.73m2

## 2024-07-11 LAB — HEMOGLOBIN A1C
Hgb A1c MFr Bld: 5.8 % — ABNORMAL HIGH
Mean Plasma Glucose: 120 mg/dL
eAG (mmol/L): 6.6 mmol/L

## 2024-07-11 LAB — LIPID PANEL
Cholesterol: 123 mg/dL
HDL: 52 mg/dL
LDL Cholesterol (Calc): 53 mg/dL
Non-HDL Cholesterol (Calc): 71 mg/dL
Total CHOL/HDL Ratio: 2.4 (calc)
Triglycerides: 96 mg/dL

## 2024-07-16 NOTE — Progress Notes (Unsigned)
 [facility] Return Visit  SUBJECTIVE  History of Present Illness: Bethany Bowman is a 74 y.o. female seen in follow-up for urgency urinary incontinence, vaginal atrophy, BMI, pelvic pressure, malodorous urine, constipation, and nocturia. Plan at last visit was low dose vaginal estrogen, continue probiotics, healthy weight and wellness.   Presented for EMB due to thickened endometrial lining today Denies vaginal bleeding since last visit UA and Nuswab 06/09/24 negative   TVUS 06/25/24 CLINICAL DATA:  Pelvic pressure   EXAM: TRANSABDOMINAL ULTRASOUND OF PELVIS   TECHNIQUE: Transabdominal ultrasound examination of the pelvis was performed including evaluation of the uterus, ovaries, adnexal regions, and pelvic cul-de-sac.   COMPARISON:  None Available.   FINDINGS: Uterus   Measurements: 17.7 x 7.8 x 8 cm = volume: 571.7 mL. Multiple uterine masses, consistent with fibroids. Largest/dominant fibroid within the mid uterus measures 6.1 x 5.9 x 5.8 cm   Endometrium   Thickness: 13 mm.  No focal abnormality visualized.   Right ovary   Not visualized due to bowel gas   Left ovary   Not visualized due to bowel gas   Other findings:  No abnormal free fluid.   IMPRESSION: 1. Enlarged uterus with multiple fibroids. 2. Endometrial thickness of 13 mm. Endometrial thickness is considered abnormal for an asymptomatic post-menopausal female. Endometrial sampling should be considered to exclude carcinoma. 3. Nonvisualized ovaries due to bowel gas     Electronically Signed   By: Luke Bun M.D.   On: 07/02/2024 21:16    Past Medical History: Patient  has a past medical history of Allergy, Anxiety (11/25/2014), Family history of colon cancer, Family history of lung cancer, Family history of prostate cancer, GERD (gastroesophageal reflux disease), Hyperlipidemia, Hypertension, and Personal history of colonic polyps.   Past Surgical History: She  has a past  surgical history that includes Colonoscopy with propofol  (N/A, 12/28/2016); Colonoscopy with propofol  (N/A, 01/15/2018); Colonoscopy with propofol  (N/A, 03/28/2021); Colonoscopy with propofol  (N/A, 04/25/2022); and Tubal ligation.   Medications: She has a current medication list which includes the following prescription(s): aspirin ec, atenolol , atorvastatin , cholecalciferol, estradiol , fluticasone , and valsartan -hydrochlorothiazide .   Allergies: Patient is allergic to shellfish allergy.   Social History: Patient  reports that she quit smoking about 42 years ago. Her smoking use included cigarettes. She started smoking about 54 years ago. She has a 6 pack-year smoking history. She has never used smokeless tobacco. She reports that she does not drink alcohol and does not use drugs.     OBJECTIVE     Physical Exam: Vitals:   07/17/24 0807 07/17/24 0939  BP: (!) 195/82 (!) 156/86  Pulse: 74 (!) 52   Gen: No apparent distress, A&O x 3.  Detailed Urogynecologic Evaluation:  Deferred. Prior exam showed:  Lab Results  Component Value Date   HGBA1C 5.8 (H) 07/10/2024   Endometrial Biopsy: After informed consent was obtained and allergies confirmed, speculum was placed and cervix prepped with chlorhexidine. Pipelle was attempted to be passed but met resistance, so single tooth tenaculum placed on anterior lip of cervix. Pipelle then passed easily through os and 3 passes made with very scant tissue and yellow fluid returned. Patient tolerated with some cramping. Tenaculum removed. Bleeding from tenaculum sites or from cervix was treated with silver nitrate and noted to be hemostatic. Tissue sent to pathology.       ASSESSMENT AND PLAN    Bethany Bowman is a 74 y.o. with:  1. Vaginal atrophy   2. Pelvic pressure in female  3. Thickened endometrium     Vaginal atrophy Assessment & Plan: - For symptomatic vaginal atrophy options include lubrication with a water-based lubricant, personal  hygiene measures and barrier protection against wetness, and estrogen replacement in the form of vaginal cream, vaginal tablets, or a time-released vaginal ring.   - continue low dose vaginal estrogen, will discontinue if abnormal EMB   Pelvic pressure in female Assessment & Plan: - history of fibroids - minimal stage I pelvic organ prolapse on exam - TVUS 06/25/24 with 17.7 x 7.8 x 8 cm uterus, multiple fibroids largest 6.1 x 5.9 x 5.8 cm, endometrial lining 13mm.  - pending EMB and referral to OBGYN to establish care   Thickened endometrium Assessment & Plan: - TVUS 06/25/24 with 17.7 x 7.8 x 8 cm uterus, multiple fibroids largest 6.1 x 5.9 x 5.8 cm, endometrial lining 13mm.  - Discussed evaluation is indicated to rule out precancerous or cancerous lesion and options include pelvic ultrasound or endometrial biopsy.  Risks of endometrial biopsy include bleeding, pain/cramping, infection and rare risk of uterine perforation and if that occurs possible laparoscopy/surgery.   - pending Endometrial biopsy today - referral to establish care with OBGYN  - discussed need for additional workup such as hysteroscopy D&C if persistent bleeding despite negative EMB   Orders: -     Ambulatory referral to Obstetrics / Gynecology   Lianne ONEIDA Gillis, MD

## 2024-07-17 ENCOUNTER — Encounter: Payer: Self-pay | Admitting: Obstetrics

## 2024-07-17 ENCOUNTER — Other Ambulatory Visit (HOSPITAL_COMMUNITY)
Admission: RE | Admit: 2024-07-17 | Discharge: 2024-07-17 | Disposition: A | Source: Ambulatory Visit | Attending: Obstetrics | Admitting: Obstetrics

## 2024-07-17 ENCOUNTER — Ambulatory Visit (INDEPENDENT_AMBULATORY_CARE_PROVIDER_SITE_OTHER): Admitting: Obstetrics

## 2024-07-17 VITALS — BP 156/86 | HR 52

## 2024-07-17 DIAGNOSIS — R9389 Abnormal findings on diagnostic imaging of other specified body structures: Secondary | ICD-10-CM | POA: Diagnosis present

## 2024-07-17 DIAGNOSIS — N952 Postmenopausal atrophic vaginitis: Secondary | ICD-10-CM | POA: Diagnosis not present

## 2024-07-17 DIAGNOSIS — R102 Pelvic and perineal pain unspecified side: Secondary | ICD-10-CM

## 2024-07-17 DIAGNOSIS — D259 Leiomyoma of uterus, unspecified: Secondary | ICD-10-CM | POA: Diagnosis not present

## 2024-07-17 NOTE — Addendum Note (Signed)
 Addended by: KRYSTAL ANDREE GAILS on: 07/17/2024 02:46 PM   Modules accepted: Orders

## 2024-07-17 NOTE — Patient Instructions (Addendum)
 Taking Care of Yourself after postmenopausal bleeding  Drink plenty of water for a day or two following your procedure. Try to have about 8 ounces (one cup) at a time, and do this 6 times or more per day unless you have fluid restrictitons AVOID irritative beverages such as coffee, tea, soda, alcoholic or citrus drinks for a day or two, as this may cause burning with urination.  For the first 1-2 days after the procedure, your urine and vaginal discharge may be pink or red in color. You may have some blood in your urine as a normal side effect of the procedure. Large amounts of bleeding or difficulty urinating are NOT normal. Call the nurse line if this happens or go to the nearest Emergency Room if the bleeding is heavy or you cannot urinate at all and it is after hours. If you had a Bulkamid injection in the urethra and need to be catheterized, ask for a pediatric catheter to be used (size 10 or 12-French) so the material is not pushed out of place.   You may experience some discomfort or a burning sensation with urination after having this procedure. You can use over the counter Azo or pyridium to help with burning and follow the instructions on the packaging. If it does not improve within 1-2 days, or other symptoms appear (fever, chills, or difficulty urinating) call the office to speak to a nurse.  You may return to normal daily activities such as work, school, driving, exercising and housework on the day of the procedure. If your doctor gave you a prescription, take it as ordered.    We have sent a referral to OBGYN for you to establish care.

## 2024-07-17 NOTE — Assessment & Plan Note (Signed)
-   TVUS 06/25/24 with 17.7 x 7.8 x 8 cm uterus, multiple fibroids largest 6.1 x 5.9 x 5.8 cm, endometrial lining 13mm.  - Discussed evaluation is indicated to rule out precancerous or cancerous lesion and options include pelvic ultrasound or endometrial biopsy.  Risks of endometrial biopsy include bleeding, pain/cramping, infection and rare risk of uterine perforation and if that occurs possible laparoscopy/surgery.   - pending Endometrial biopsy today - referral to establish care with OBGYN  - discussed need for additional workup such as hysteroscopy D&C if persistent bleeding despite negative EMB

## 2024-07-17 NOTE — Assessment & Plan Note (Signed)
-   history of fibroids - minimal stage I pelvic organ prolapse on exam - TVUS 06/25/24 with 17.7 x 7.8 x 8 cm uterus, multiple fibroids largest 6.1 x 5.9 x 5.8 cm, endometrial lining 13mm.  - pending EMB and referral to OBGYN to establish care

## 2024-07-17 NOTE — Assessment & Plan Note (Signed)
-   For symptomatic vaginal atrophy options include lubrication with a water-based lubricant, personal hygiene measures and barrier protection against wetness, and estrogen replacement in the form of vaginal cream, vaginal tablets, or a time-released vaginal ring.   - continue low dose vaginal estrogen, will discontinue if abnormal EMB

## 2024-07-22 ENCOUNTER — Ambulatory Visit: Payer: Self-pay | Admitting: Obstetrics

## 2024-07-22 LAB — SURGICAL PATHOLOGY

## 2024-08-07 ENCOUNTER — Ambulatory Visit: Admitting: Family Medicine

## 2024-08-29 ENCOUNTER — Ambulatory Visit: Admitting: Obstetrics

## 2024-09-08 ENCOUNTER — Ambulatory Visit: Admitting: Obstetrics

## 2024-10-08 ENCOUNTER — Ambulatory Visit: Admitting: Family Medicine

## 2025-01-02 ENCOUNTER — Ambulatory Visit
